# Patient Record
Sex: Female | Born: 1945
Health system: Southern US, Community
[De-identification: ages and names within clinical notes are randomized; demographics above are authoritative.]

## PROBLEM LIST (undated history)

## (undated) DIAGNOSIS — K219 Gastro-esophageal reflux disease without esophagitis: Secondary | ICD-10-CM

## (undated) DIAGNOSIS — Z86010 Personal history of colonic polyps: Secondary | ICD-10-CM

## (undated) DIAGNOSIS — E785 Hyperlipidemia, unspecified: Secondary | ICD-10-CM

## (undated) DIAGNOSIS — G5600 Carpal tunnel syndrome, unspecified upper limb: Secondary | ICD-10-CM

## (undated) DIAGNOSIS — D219 Benign neoplasm of connective and other soft tissue, unspecified: Secondary | ICD-10-CM

## (undated) DIAGNOSIS — I10 Essential (primary) hypertension: Secondary | ICD-10-CM

## (undated) DIAGNOSIS — R42 Dizziness and giddiness: Secondary | ICD-10-CM

## (undated) DIAGNOSIS — T7840XA Allergy, unspecified, initial encounter: Secondary | ICD-10-CM

## (undated) HISTORY — DX: Carpal tunnel syndrome, unspecified upper limb: G56.00

## (undated) HISTORY — PX: POLYPECTOMY: SHX149

## (undated) HISTORY — PX: COLONOSCOPY: SHX174

## (undated) HISTORY — DX: Allergy, unspecified, initial encounter: T78.40XA

## (undated) HISTORY — PX: CARPAL TUNNEL RELEASE: SHX101

## (undated) HISTORY — DX: Hyperlipidemia, unspecified: E78.5

## (undated) HISTORY — DX: Personal history of colonic polyps: Z86.010

## (undated) HISTORY — DX: Dizziness and giddiness: R42

## (undated) HISTORY — PX: BIOPSY THYROID: PRO38

## (undated) HISTORY — DX: Gastro-esophageal reflux disease without esophagitis: K21.9

## (undated) HISTORY — DX: Essential (primary) hypertension: I10

## (undated) HISTORY — DX: Benign neoplasm of connective and other soft tissue, unspecified: D21.9

## (undated) HISTORY — PX: UTERINE FIBROID SURGERY: SHX826

---

## 1991-06-17 HISTORY — PX: HYSTEROSCOPY: SHX211

## 1997-08-02 ENCOUNTER — Other Ambulatory Visit: Admission: RE | Admit: 1997-08-02 | Discharge: 1997-08-02 | Payer: Self-pay | Admitting: *Deleted

## 1999-04-24 ENCOUNTER — Encounter: Admission: RE | Admit: 1999-04-24 | Discharge: 1999-04-24 | Payer: Self-pay | Admitting: Family Medicine

## 1999-04-24 ENCOUNTER — Encounter: Payer: Self-pay | Admitting: Family Medicine

## 1999-06-25 ENCOUNTER — Ambulatory Visit (HOSPITAL_BASED_OUTPATIENT_CLINIC_OR_DEPARTMENT_OTHER): Admission: RE | Admit: 1999-06-25 | Discharge: 1999-06-25 | Payer: Self-pay | Admitting: Orthopedic Surgery

## 1999-11-05 ENCOUNTER — Encounter: Payer: Self-pay | Admitting: *Deleted

## 1999-11-05 ENCOUNTER — Ambulatory Visit (HOSPITAL_COMMUNITY): Admission: RE | Admit: 1999-11-05 | Discharge: 1999-11-05 | Payer: Self-pay | Admitting: *Deleted

## 2000-01-13 ENCOUNTER — Other Ambulatory Visit: Admission: RE | Admit: 2000-01-13 | Discharge: 2000-01-13 | Payer: Self-pay | Admitting: *Deleted

## 2000-03-31 ENCOUNTER — Encounter: Payer: Self-pay | Admitting: *Deleted

## 2000-03-31 ENCOUNTER — Encounter: Admission: RE | Admit: 2000-03-31 | Discharge: 2000-03-31 | Payer: Self-pay | Admitting: *Deleted

## 2000-04-28 DIAGNOSIS — Z860100 Personal history of colon polyps, unspecified: Secondary | ICD-10-CM

## 2000-04-28 HISTORY — DX: Personal history of colonic polyps: Z86.010

## 2000-04-28 HISTORY — DX: Personal history of colon polyps, unspecified: Z86.0100

## 2001-03-16 ENCOUNTER — Encounter: Payer: Self-pay | Admitting: *Deleted

## 2001-03-16 ENCOUNTER — Encounter: Admission: RE | Admit: 2001-03-16 | Discharge: 2001-03-16 | Payer: Self-pay | Admitting: *Deleted

## 2001-03-22 ENCOUNTER — Encounter: Payer: Self-pay | Admitting: Family Medicine

## 2001-03-22 ENCOUNTER — Encounter: Admission: RE | Admit: 2001-03-22 | Discharge: 2001-03-22 | Payer: Self-pay | Admitting: Family Medicine

## 2001-07-07 ENCOUNTER — Other Ambulatory Visit: Admission: RE | Admit: 2001-07-07 | Discharge: 2001-07-07 | Payer: Self-pay | Admitting: *Deleted

## 2002-01-18 LAB — HM COLONOSCOPY

## 2002-03-07 ENCOUNTER — Encounter: Admission: RE | Admit: 2002-03-07 | Discharge: 2002-03-07 | Payer: Self-pay | Admitting: Family Medicine

## 2002-03-07 ENCOUNTER — Encounter: Payer: Self-pay | Admitting: Family Medicine

## 2003-10-11 ENCOUNTER — Other Ambulatory Visit: Admission: RE | Admit: 2003-10-11 | Discharge: 2003-10-11 | Payer: Self-pay | Admitting: *Deleted

## 2004-06-18 ENCOUNTER — Ambulatory Visit (HOSPITAL_COMMUNITY): Admission: RE | Admit: 2004-06-18 | Discharge: 2004-06-18 | Payer: Self-pay | Admitting: Family Medicine

## 2004-06-26 ENCOUNTER — Encounter: Admission: RE | Admit: 2004-06-26 | Discharge: 2004-06-26 | Payer: Self-pay | Admitting: Family Medicine

## 2004-07-16 ENCOUNTER — Ambulatory Visit: Payer: Self-pay | Admitting: Gastroenterology

## 2004-07-22 ENCOUNTER — Ambulatory Visit (HOSPITAL_COMMUNITY): Admission: RE | Admit: 2004-07-22 | Discharge: 2004-07-22 | Payer: Self-pay | Admitting: Gastroenterology

## 2004-07-26 ENCOUNTER — Encounter: Admission: RE | Admit: 2004-07-26 | Discharge: 2004-10-24 | Payer: Self-pay | Admitting: Gastroenterology

## 2005-01-24 ENCOUNTER — Other Ambulatory Visit: Admission: RE | Admit: 2005-01-24 | Discharge: 2005-01-24 | Payer: Self-pay | Admitting: *Deleted

## 2005-11-26 ENCOUNTER — Encounter: Admission: RE | Admit: 2005-11-26 | Discharge: 2005-11-26 | Payer: Self-pay | Admitting: Family Medicine

## 2006-06-12 ENCOUNTER — Other Ambulatory Visit: Admission: RE | Admit: 2006-06-12 | Discharge: 2006-06-12 | Payer: Self-pay | Admitting: Obstetrics and Gynecology

## 2006-07-02 ENCOUNTER — Ambulatory Visit: Payer: Self-pay | Admitting: Gastroenterology

## 2006-07-27 ENCOUNTER — Ambulatory Visit: Payer: Self-pay | Admitting: Gastroenterology

## 2006-07-27 ENCOUNTER — Encounter (INDEPENDENT_AMBULATORY_CARE_PROVIDER_SITE_OTHER): Payer: Self-pay | Admitting: Specialist

## 2006-07-27 LAB — HM COLONOSCOPY

## 2007-11-17 ENCOUNTER — Other Ambulatory Visit: Admission: RE | Admit: 2007-11-17 | Discharge: 2007-11-17 | Payer: Self-pay | Admitting: Obstetrics and Gynecology

## 2008-04-28 HISTORY — PX: KNEE ARTHROSCOPY: SUR90

## 2009-08-22 ENCOUNTER — Ambulatory Visit (HOSPITAL_COMMUNITY): Admission: RE | Admit: 2009-08-22 | Discharge: 2009-08-22 | Payer: Self-pay | Admitting: Obstetrics and Gynecology

## 2009-09-12 HISTORY — PX: ENDOMETRIAL BIOPSY: SHX622

## 2009-10-22 ENCOUNTER — Ambulatory Visit (HOSPITAL_BASED_OUTPATIENT_CLINIC_OR_DEPARTMENT_OTHER): Admission: RE | Admit: 2009-10-22 | Discharge: 2009-10-22 | Payer: Self-pay | Admitting: Obstetrics and Gynecology

## 2009-10-22 HISTORY — PX: HYSTEROSCOPY: SHX211

## 2010-01-23 ENCOUNTER — Encounter (INDEPENDENT_AMBULATORY_CARE_PROVIDER_SITE_OTHER): Payer: Self-pay | Admitting: *Deleted

## 2010-01-23 LAB — CONVERTED CEMR LAB
Hgb A1c MFr Bld: 5.9 %
LDL Cholesterol: 84 mg/dL
Triglycerides: 77 mg/dL

## 2010-04-10 ENCOUNTER — Encounter (INDEPENDENT_AMBULATORY_CARE_PROVIDER_SITE_OTHER): Payer: Self-pay | Admitting: *Deleted

## 2010-04-10 LAB — CONVERTED CEMR LAB
Albumin: 4.5 g/dL
Alkaline Phosphatase: 73 units/L
Calcium: 10.1 mg/dL
Chloride, Serum: 97 mmol/L
Creatinine, Ser: 0.84 mg/dL
Potassium, serum: 4.1 mmol/L
Sodium, serum: 135 mmol/L
Total Protein: 7.2 g/dL

## 2010-05-06 ENCOUNTER — Ambulatory Visit
Admission: RE | Admit: 2010-05-06 | Discharge: 2010-05-06 | Payer: Self-pay | Source: Home / Self Care | Attending: Family Medicine | Admitting: Family Medicine

## 2010-05-06 DIAGNOSIS — E785 Hyperlipidemia, unspecified: Secondary | ICD-10-CM | POA: Insufficient documentation

## 2010-05-06 DIAGNOSIS — I10 Essential (primary) hypertension: Secondary | ICD-10-CM | POA: Insufficient documentation

## 2010-05-06 DIAGNOSIS — J309 Allergic rhinitis, unspecified: Secondary | ICD-10-CM | POA: Insufficient documentation

## 2010-05-14 ENCOUNTER — Encounter (INDEPENDENT_AMBULATORY_CARE_PROVIDER_SITE_OTHER): Payer: Self-pay | Admitting: *Deleted

## 2010-05-30 NOTE — Miscellaneous (Signed)
  Clinical Lists Changes  Observations: Added new observation of BILI TOTAL: 1.0 mg/dL (16/01/9603 54:09) Added new observation of ALK PHOS: 73 units/L (04/10/2010 14:04) Added new observation of SGPT (ALT): 33 units/L (04/10/2010 14:04) Added new observation of SGOT (AST): 35 units/L (04/10/2010 14:04) Added new observation of PROTEIN, TOT: 7.2 g/dL (81/19/1478 29:56) Added new observation of GLOBULIN TOT: 2.7 g/dL (21/30/8657 84:69) Added new observation of ALBUMIN: 4.5 g/dL (62/95/2841 32:44) Added new observation of CALCIUM: 10.1 mg/dL (04/30/7251 66:44) Added new observation of GLUCOSE SER: 103 mg/dL (03/47/4259 56:38) Added new observation of CREATININE: 0.84 mg/dL (75/64/3329 51:88) Added new observation of BUN: 19 mg/dL (41/66/0630 16:01) Added new observation of CO2 TOTAL: 26 mmol/L (04/10/2010 14:04) Added new observation of CHLORIDE: 97 mmol/L (04/10/2010 14:04) Added new observation of POTASSIUM: 4.1 mmol/L (04/10/2010 14:04) Added new observation of SODIUM: 135 mmol/L (04/10/2010 14:04) Added new observation of HGBA1C: 5.9 % (01/23/2010 14:04) Added new observation of TSH: 3.180 microintl units/mL (01/23/2010 14:04) Added new observation of TRIGLYC TOT: 77 mg/dL (09/32/3557 32:20) Added new observation of LDL: 84 mg/dL (25/42/7062 37:62) Added new observation of HDL: 52 mg/dL (83/15/1761 60:73) Added new observation of CHOLESTEROL: 151 mg/dL (71/09/2692 85:46) Added new observation of PNEUMOVAX: Historical  (03/17/2008 14:07) Added new observation of COLONOSCOPY: Adenomatous Polyp  (01/18/2002 14:20)      Immunization History:  Pneumovax Immunization History:    Pneumovax:  historical (03/17/2008)   Preventive Care Screening  Last Pneumovax:    Date:  03/17/2008    Results:  Historical   Colonoscopy:    Date:  01/18/2002    Results:  Adenomatous Polyp

## 2010-05-30 NOTE — Assessment & Plan Note (Signed)
Summary: NEW TO EST/KN   Vital Signs:  Patient profile:   65 year old female Height:      68.25 inches Weight:      226 pounds BMI:     34.24 Pulse rate:   74 / minute BP sitting:   108 / 70  (left arm)  Vitals Entered By: Doristine Devoid CMA (May 06, 2010 10:08 AM) CC: new est-    History of Present Illness: 65 yo woman here today to establish care.  previous MD- Larina Bras.  Dr Tresa Res- GYN, UTD on pap/mammo.  Dr Russella Dar does Colonoscopy- UTD, last done 2008.  HTN- on lisinopril and HCTZ.  BP is excellent.  no CP, SOB, HAs, visual changes, edema.  Hyperlipidemia- on pravastatin 40mg .  no abd pain, N/V, myalgias.  joined [employer] in October, has lost 20lbs.  cough- pt reports mucous in the back of her throat that precipitates cough.  sxs started 6 weeks ago.  otherwise feeling well.  no fevers, chills, ear pain.  some nasal congestion.  no wheezing.  cough is worse in AM and at night.  Preventive Screening-Counseling & Management  Alcohol-Tobacco     Alcohol drinks/day: <1     Smoking Status: never  Caffeine-Diet-Exercise     Does Patient Exercise: yes     Type of exercise: [employer]     Exercise (avg: min/session): 30-60     Times/week: 3      Drug Use:  never.    Current Medications (verified): 1)  Aspirin 81 Mg Tbec (Aspirin) .... Take One Tablet Daily 2)  Lisinopril 10 Mg Tabs (Lisinopril) .... Take One Tablet Daily 3)  Hydrochlorothiazide 25 Mg Tabs (Hydrochlorothiazide) .... Take One Tablet Daily 4)  Pravastatin Sodium 40 Mg Tabs (Pravastatin Sodium) .... Take One Tablet At Bedtime  Allergies (verified): 1)  ! * Iodine  Past History:  Past Medical History: Hyperlipidemia Hypertension Borderline DM  Past Surgical History: Carpal tunnel release-bilateral  fibroids-Romine  Family History: CAD-mother HTN-mother DM-mother STROKE-sister COLON CA-father? 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 No apparent alopecia or balding. Eyes:  PERRL, EOMI Ears:  External ear exam shows no significant lesions or deformities.  Otoscopic examination reveals clear canals, tympanic membranes are intact bilaterally without bulging, retraction, inflammation or discharge. Hearing is grossly normal bilaterally. Nose:  + turbinate edema Mouth:  + PND, otherwise WNL Neck:  No deformities, masses, or tenderness noted. Lungs:  Normal respiratory effort, chest expands symmetrically. Lungs are clear to auscultation, no crackles or wheezes. Heart:  Normal rate and regular rhythm. S1 and S2 normal without gallop, murmur, click, rub or other extra sounds. Pulses:  +2 carotid, radial, DP Extremities:  no C/C/E Neurologic:  alert & oriented X3, cranial nerves II-XII intact, gait normal, and DTRs symmetrical and normal.   Skin:  turgor normal and color normal.   Cervical Nodes:  No lymphadenopathy noted Psych:  Oriented X3, memory intact for recent and remote, normally interactive, good eye contact, not anxious appearing, and not depressed appearing.     Impression & Recommendations:  Problem # 1:  HYPERTENSION (ICD-401.9) Assessment New pt's BP well controlled.  asymptomatic.  continue  current meds. Her updated medication list for this problem includes:    Lisinopril 10 Mg Tabs (Lisinopril) .Marland Kitchen... Take one tablet daily    Hydrochlorothiazide 25 Mg Tabs (Hydrochlorothiazide) .Marland Kitchen... Take one tablet daily  Problem # 2:  HYPERLIPIDEMIA (ICD-272.4) Assessment: New per pt report she just had labs done.  will review records from previous MD and continue to get labs Q6 months. Her  updated medication list for this problem includes:    Pravastatin Sodium 40 Mg Tabs (Pravastatin sodium) .Marland Kitchen... Take one tablet at bedtime  Problem # 3:  RHINITIS (ICD-477.9) Assessment: New most likely the cause of pt's cough.  pt reports she has nasal spray at home- advised restart.  add OTC claritin or zyrtec.  if no improvement in cough w/ tx of allergies must consider ACE cough.  will follow.  Complete Medication List: 1)  Aspirin 81 Mg Tbec (Aspirin) .... Take one tablet daily 2)  Lisinopril 10 Mg Tabs (Lisinopril) .... Take one tablet daily 3)  Hydrochlorothiazide 25 Mg Tabs (Hydrochlorothiazide) .... Take one tablet daily 4)  Pravastatin Sodium 40 Mg Tabs (Pravastatin sodium) .... Take one tablet at bedtime  Patient Instructions: 1)  Please schedule a follow up exam in April to recheck cholesterol and BP- do not eat before this exam 2)  Continue your current medication- your blood pressure looks great! 3)  Your cough is most likely due to post nasal drip- not asthma.  restart your nasal spray (2 sprays each nostril daily) and add OTC Claritin or Zyrtec (store brand works just as well) 4)  Call with any questions or concerns 5)  Welcome!  We're glad to have you!   Orders Added: 1)  New Patient Level III [40981]

## 2010-06-28 ENCOUNTER — Encounter: Payer: Self-pay | Admitting: Family Medicine

## 2010-06-28 ENCOUNTER — Ambulatory Visit (INDEPENDENT_AMBULATORY_CARE_PROVIDER_SITE_OTHER): Payer: Medicare Other | Admitting: Family Medicine

## 2010-06-28 DIAGNOSIS — R42 Dizziness and giddiness: Secondary | ICD-10-CM | POA: Insufficient documentation

## 2010-06-28 DIAGNOSIS — I951 Orthostatic hypotension: Secondary | ICD-10-CM

## 2010-06-28 DIAGNOSIS — J309 Allergic rhinitis, unspecified: Secondary | ICD-10-CM

## 2010-07-04 NOTE — Assessment & Plan Note (Signed)
Summary: dizzy for two days, bp is good///sph   Vital Signs:  Patient profile:   66 year old female Height:      68.25 inches Weight:      227 pounds O2 Sat:      97 % on Room air Temp:     98.1 degrees F oral Pulse rate:   77 / minute BP supine:   120 / 76  (left arm) BP sitting:   118 / 70  (left arm) BP standing:   110 / 70  (left arm) Cuff size:   large  Vitals Entered By: Jeremy Johann CMA (June 28, 2010 1:17 PM)  O2 Flow:  Room air CC: positional dizziness Comments orthostatic Vital: standing pulse 84 O2 97, laying pulse 70, O2 95   History of Present Illness: 65 yo woman here today for dizziness w/ position changes.  has hx of vertigo.  had sensation of spinning when she lied down for BP check.  having dizziness w/ rapid turning of head.  no nausea, no vomiting, no visual changes (blurry/double vision).  BP excellently controlled, mildly orthostatic- did not have dizziness upon standing today.  'i think it's inner ear'.  also complaining of 'sinus thing'- not using allergy meds.  + congestion, PND, cough.  sxs started 3 days ago.  Current Medications (verified): 1)  Aspirin 81 Mg Tbec (Aspirin) .... Take One Tablet Daily 2)  Lisinopril-Hydrochlorothiazide 10-12.5 Mg Tabs (Lisinopril-Hydrochlorothiazide) .Marland Kitchen.. 1 Tab Daily 3)  Pravastatin Sodium 40 Mg Tabs (Pravastatin Sodium) .... Take One Tablet At Bedtime 4)  Fluticasone Propionate 50 Mcg/act  Susp (Fluticasone Propionate) .... 2 Sprays Each Nostril Once Daily 5)  Meclizine Hcl 25 Mg Tabs (Meclizine Hcl) .Marland Kitchen.. 1 By Mouth Q6 As Needed For Vertigo  Allergies (verified): 1)  ! * Iodine  Past History:  Past Medical History: Hyperlipidemia Hypertension Borderline DM vertigo allergic rhinitis  Review of Systems      See HPI  Physical Exam  General:  Well-developed,well-nourished,in no acute distress; alert,appropriate and cooperative throughout examination Head:  Normocephalic and atraumatic without obvious  abnormalities. No apparent alopecia or balding.  no TTP over sinuses Eyes:  PERRL, EOMI, 3 beats of horizontal nystagmus Ears:  External ear exam shows no significant lesions or deformities.  Otoscopic examination reveals clear canals, tympanic membranes are intact bilaterally without bulging, retraction, inflammation or discharge. Hearing is grossly normal bilaterally. Nose:  + turbinate edema Mouth:  + PND, otherwise WNL Neck:  No deformities, masses, or tenderness noted. Lungs:  Normal respiratory effort, chest expands symmetrically. Lungs are clear to auscultation, no crackles or wheezes. Heart:  Normal rate and regular rhythm. S1 and S2 normal without gallop, murmur, click, rub or other extra sounds. Pulses:  +2 carotid, radial, DP Extremities:  no C/C/E Neurologic:  alert & oriented X3, cranial nerves II-XII intact, strength normal in all extremities, sensation intact to light touch, gait normal, DTRs symmetrical and normal, finger-to-nose normal, and Romberg negative.     Impression & Recommendations:  Problem # 1:  VERTIGO (ICD-780.4) Assessment New pt w/ hx of vertigo- more severe than this episode.  sxs consistent w/ BPV as she has problems turning head rapidly.  start Meclizine as needed for dizziness. Her updated medication list for this problem includes:    Meclizine Hcl 25 Mg Tabs (Meclizine hcl) .Marland Kitchen... 1 by mouth q6 as needed for vertigo  Problem # 2:  ORTHOSTATIC HYPOTENSION (ICD-458.0) Assessment: New pt mildly orthostatic on PE.  decrease HCTZ to  12.5mg  daily.  recheck BP at upcoming visit.  Problem # 3:  RHINITIS (ICD-477.9) Assessment: Unchanged pt not using allergy meds as directed.  restart Flonase.  add Claritin or Zyrtec if sxs not adequately controlled on nasal steroid Her updated medication list for this problem includes:    Fluticasone Propionate 50 Mcg/act Susp (Fluticasone propionate) .Marland Kitchen... 2 sprays each nostril once daily  Complete Medication List: 1)   Aspirin 81 Mg Tbec (Aspirin) .... Take one tablet daily 2)  Lisinopril-hydrochlorothiazide 10-12.5 Mg Tabs (Lisinopril-hydrochlorothiazide) .Marland Kitchen.. 1 tab daily 3)  Pravastatin Sodium 40 Mg Tabs (Pravastatin sodium) .... Take one tablet at bedtime 4)  Fluticasone Propionate 50 Mcg/act Susp (Fluticasone propionate) .... 2 sprays each nostril once daily 5)  Meclizine Hcl 25 Mg Tabs (Meclizine hcl) .Marland Kitchen.. 1 by mouth q6 as needed for vertigo  Patient Instructions: 1)  You may have 2 things going on today- positional vertigo and low blood pressure causing your dizziness 2)  Follow up as scheduled 3)  We decreased your HCTZ to 12.5mg  and combined it w/ the Lisinopril 4)  Take the Meclizine as needed for the dizziness 5)  Drink plenty of fluids 6)  Call with any questions or concerns 7)  Hang in there! Prescriptions: MECLIZINE HCL 25 MG TABS (MECLIZINE HCL) 1 by mouth q6 as needed for vertigo  #45 x 0   Entered and Authorized by:   Neena Rhymes MD   Signed by:   Neena Rhymes MD on 06/28/2010   Method used:   Electronically to        [facility]  [address] [id number]* (retail)       [address]       [place], [place]  [address]       Ph: [phone] or [phone]       Fax: [phone]   RxID:   [id number] FLUTICASONE PROPIONATE 50 MCG/ACT  SUSP (FLUTICASONE PROPIONATE) 2 sprays each nostril once daily  #1 vial x 3   Entered and Authorized by:   Neena Rhymes MD   Signed by:   Neena Rhymes MD on 06/28/2010   Method used:   Electronically to        [facility]  [address] [id number]* (retail)       [address]       [place], [place]  [address]       Ph: [phone] or [phone]       Fax: [phone]   RxID:   [id number] LISINOPRIL-HYDROCHLOROTHIAZIDE 10-12.5 MG TABS (LISINOPRIL-HYDROCHLOROTHIAZIDE) 1 tab daily  #30 x 3   Entered and Authorized by:   Neena Rhymes MD   Signed by:   Neena Rhymes MD on 06/28/2010   Method  used:   Electronically to        [facility]  [address] [id number]* (retail)       [address]       [place], [place]  [address]       Ph: [phone] or [phone]       Fax: [phone]   RxID:   [id number]    Orders Added: 1)  Est. Patient Level IV [10175]

## 2010-07-06 ENCOUNTER — Encounter: Payer: Self-pay | Admitting: Family Medicine

## 2010-07-14 LAB — CBC
MCH: 32.6 pg (ref 26.0–34.0)
MCHC: 34.7 g/dL (ref 30.0–36.0)
MCV: 93.9 fL (ref 78.0–100.0)
Platelets: 229 10*3/uL (ref 150–400)
RBC: 4.26 MIL/uL (ref 3.87–5.11)
RDW: 14.2 % (ref 11.5–15.5)
WBC: 4.1 10*3/uL (ref 4.0–10.5)

## 2010-08-05 ENCOUNTER — Encounter: Payer: Self-pay | Admitting: Family Medicine

## 2010-08-05 ENCOUNTER — Ambulatory Visit (INDEPENDENT_AMBULATORY_CARE_PROVIDER_SITE_OTHER): Payer: Medicare Other | Admitting: Family Medicine

## 2010-08-05 DIAGNOSIS — E785 Hyperlipidemia, unspecified: Secondary | ICD-10-CM

## 2010-08-05 DIAGNOSIS — I10 Essential (primary) hypertension: Secondary | ICD-10-CM

## 2010-08-05 LAB — BASIC METABOLIC PANEL
BUN: 14 mg/dL (ref 6–23)
Calcium: 9.4 mg/dL (ref 8.4–10.5)
Creatinine, Ser: 0.8 mg/dL (ref 0.4–1.2)
GFR: 82.17 mL/min (ref >60.00)
Potassium: 4.2 mEq/L (ref 3.5–5.1)

## 2010-08-05 LAB — HEPATIC FUNCTION PANEL
ALT: 28 U/L (ref 0–35)
Albumin: 4 g/dL (ref 3.5–5.2)
Bilirubin, Direct: 0.2 mg/dL (ref 0.0–0.3)

## 2010-08-05 LAB — LIPID PANEL
Cholesterol: 132 mg/dL (ref 0–200)
LDL Cholesterol: 67 mg/dL (ref 0–99)

## 2010-08-05 MED ORDER — LISINOPRIL-HYDROCHLOROTHIAZIDE 10-12.5 MG PO TABS: 1.0000 | ORAL_TABLET | Freq: Every day | ORAL | Status: DC

## 2010-08-05 MED ORDER — PRAVASTATIN SODIUM 40 MG PO TABS: 40.0000 mg | ORAL_TABLET | Freq: Every day | ORAL | Status: DC

## 2010-08-05 NOTE — Assessment & Plan Note (Signed)
BP well controlled.  Asymptomatic.  No changes.

## 2010-08-05 NOTE — Patient Instructions (Signed)
Follow up in 6 months for your complete physical- do not eat before this appt You look great!  Keep up the good work! We'll notify you of your lab results Call with any questions or concerns Happy Spring!!!

## 2010-08-05 NOTE — Assessment & Plan Note (Signed)
Pt tolerating statin w/out difficulty.  Check labs and adjust meds prn.  Pt expressed understanding and is in agreement w/ plan.

## 2010-08-05 NOTE — Progress Notes (Signed)
  Subjective:    Patient ID: Shelby Reeves, female    DOB: June 20, 1945, 65 y.o.   MRN: [id number]  HPI  HTN- BP well controlled, hasn't taken meds this AM.  No CP, SOB, HAs, visual changes, edema.  Exercising at least 3x/week.  Hyperlipidemia- due for labs today.  No N/V, abd pain, myalgias.   Review of Systems For ROS see HPI     Objective:   Physical Exam  Constitutional: She appears well-developed and well-nourished. No distress.  HENT:  Head: Normocephalic and atraumatic.  Neck: Normal range of motion. Neck supple. No thyromegaly present.  Cardiovascular: Normal rate, regular rhythm, normal heart sounds and intact distal pulses.   No murmur heard. Pulmonary/Chest: Effort normal and breath sounds normal. No respiratory distress. She has no wheezes.  Abdominal: Soft. Bowel sounds are normal. She exhibits no distension. There is no tenderness. There is no rebound.  Skin: Skin is warm and dry.          Assessment & Plan:

## 2010-08-27 ENCOUNTER — Ambulatory Visit (INDEPENDENT_AMBULATORY_CARE_PROVIDER_SITE_OTHER): Payer: Medicare Other | Admitting: Family Medicine

## 2010-08-27 VITALS — BP 110/78 | HR 59 | Temp 97.9°F | Wt 226.0 lb

## 2010-08-27 DIAGNOSIS — M549 Dorsalgia, unspecified: Secondary | ICD-10-CM

## 2010-08-27 DIAGNOSIS — M545 Low back pain, unspecified: Secondary | ICD-10-CM

## 2010-08-27 MED ORDER — NAPROXEN 500 MG PO TABS: 500.0000 mg | ORAL_TABLET | Freq: Two times a day (BID) | ORAL | Status: DC

## 2010-08-27 NOTE — Assessment & Plan Note (Signed)
This is an acute exacerbation of a chronic problem for pt.  Has not seen ortho for this.  Given that she has frequent discomfort will refer to ortho for complete evaluation and tx.  Start scheduled NSAIDs for pain relief, heat/ice prn.  Reviewed supportive care and red flags that should prompt return.  Pt expressed understanding and is in agreement w/ plan.

## 2010-08-27 NOTE — Patient Instructions (Signed)
Take the Naprosyn for the next 7-10 days to decrease inflammation Add tylenol throughout the day as needed for pain Use heat for pain relief Try and stay active to avoid stiffness We'll call you with your Ortho appt Hang in there!!!

## 2010-08-27 NOTE — Progress Notes (Signed)
  Subjective:    Patient ID: Shelby Reeves, female    DOB: 22-Sep-1945, 65 y.o.   MRN: [id number]  HPI LBP- R sided, lumbar.  sxs started 1 week ago.  No heavy lifting, change in activity.  Was sitting in her chair and rotated, felt a 'twinge' and pain started.  Pain is not radiating.  Stays localized to R SI joint.  Has never had MRI of spine.  Initial injury occurred when pt was 15 and has had intermittant pain episodes since then.  Taking ibuprofen w/ some relief, 600mg  every 4 hrs.  Pain is worst w/ rising from a chair.   Review of Systems For ROS see HPI     Objective:   Physical Exam  Constitutional: She is oriented to person, place, and time. She appears well-developed and well-nourished. No distress.  Musculoskeletal: She exhibits no edema.       Lumbar back: She exhibits decreased range of motion and tenderness. She exhibits no bony tenderness, no swelling, no deformity and no spasm.       Back:       Pt has difficulty rising from seated position.  Gait normal.  Strength in LEs normal.  Neurological: She is alert and oriented to person, place, and time. She has normal reflexes. Coordination normal.          Assessment & Plan:

## 2011-02-04 ENCOUNTER — Encounter: Payer: Medicare Other | Admitting: Family Medicine

## 2011-02-12 ENCOUNTER — Encounter: Payer: Self-pay | Admitting: Family Medicine

## 2011-02-12 ENCOUNTER — Ambulatory Visit (INDEPENDENT_AMBULATORY_CARE_PROVIDER_SITE_OTHER): Payer: Medicare Other | Admitting: Family Medicine

## 2011-02-12 DIAGNOSIS — R059 Cough, unspecified: Secondary | ICD-10-CM

## 2011-02-12 DIAGNOSIS — Z23 Encounter for immunization: Secondary | ICD-10-CM

## 2011-02-12 DIAGNOSIS — I10 Essential (primary) hypertension: Secondary | ICD-10-CM

## 2011-02-12 DIAGNOSIS — E785 Hyperlipidemia, unspecified: Secondary | ICD-10-CM

## 2011-02-12 DIAGNOSIS — Z0000 Encounter for general adult medical examination without abnormal findings: Secondary | ICD-10-CM | POA: Insufficient documentation

## 2011-02-12 DIAGNOSIS — R05 Cough: Secondary | ICD-10-CM

## 2011-02-12 LAB — CBC WITH DIFFERENTIAL/PLATELET
Basophils Relative: 0.5 % (ref 0.0–3.0)
Eosinophils Absolute: 0.1 10*3/uL (ref 0.0–0.7)
Eosinophils Relative: 1.7 % (ref 0.0–5.0)
HCT: 41.5 % (ref 36.0–46.0)
Hemoglobin: 13.9 g/dL (ref 12.0–15.0)
Lymphocytes Relative: 28.8 % (ref 12.0–46.0)
Lymphs Abs: 1.3 10*3/uL (ref 0.7–4.0)
Monocytes Absolute: 0.4 10*3/uL (ref 0.1–1.0)
Monocytes Relative: 8.9 % (ref 3.0–12.0)
Neutro Abs: 2.8 10*3/uL (ref 1.4–7.7)
Neutrophils Relative %: 60.1 % (ref 43.0–77.0)
Platelets: 263 10*3/uL (ref 150.0–400.0)

## 2011-02-12 LAB — BASIC METABOLIC PANEL
BUN: 19 mg/dL (ref 6–23)
CO2: 28 mEq/L (ref 19–32)
Calcium: 9.8 mg/dL (ref 8.4–10.5)
GFR: 83.32 mL/min (ref >60.00)
Glucose, Bld: 94 mg/dL (ref 70–99)

## 2011-02-12 LAB — HEPATIC FUNCTION PANEL
ALT: 37 U/L — ABNORMAL HIGH (ref 0–35)
Albumin: 4.1 g/dL (ref 3.5–5.2)
Alkaline Phosphatase: 71 U/L (ref 39–117)
Bilirubin, Direct: 0 mg/dL (ref 0.0–0.3)

## 2011-02-12 LAB — LIPID PANEL
LDL Cholesterol: 86 mg/dL (ref 0–99)
Total CHOL/HDL Ratio: 3
Triglycerides: 98 mg/dL (ref 0.0–149.0)

## 2011-02-12 LAB — TSH: TSH: 3.25 u[IU]/mL (ref 0.35–5.50)

## 2011-02-12 NOTE — Patient Instructions (Signed)
Follow up in 6 months to recheck BP and cholesterol Keep up the good work on diet and exercise Schedule an appt w/ your dermatologist for a once over [facility] notify you of your lab results Call with any questions or concerns Happy Holidays!!

## 2011-02-12 NOTE — Progress Notes (Signed)
  Subjective:    Patient ID: Shelby Reeves, female    DOB: 02-14-1946, 65 y.o.   MRN: [id number]  HPI Here today for CPE.  Risk Factors: HTN- chronic problem, well controlled, on Lisinopril HCT.  Asymptomatic. Hyperlipidemia- chronic problem, typically well controlled on Pravastatin.  No N/V, abd pain, myalgias Physical Activity: walking regularly Fall Risk: low risk Depression: denies current sxs Hearing: normal to conversational tones and whispered voice at 6 ft ADL's: indpendent Cognitive: normal linear thought process, memory and attention intact Home Safety: safe at home, lives alone Height, Weight, BMI, Visual Acuity: see vitals, vision corrected to 20/20 w/ glasses Counseling: GYN- Romine, UTD pap and mammo.  UTD on colonoscopy Russella Dar) Labs Ordered: See A&P Care Plan: See A&P    Review of Systems Patient reports no vision/ hearing changes, adenopathy,fever, weight change,  persistant/recurrent hoarseness , swallowing issues, chest pain, palpitations, edema, hemoptysis, dyspnea (rest/exertional/paroxysmal nocturnal), gastrointestinal bleeding (melena, rectal bleeding), abdominal pain, significant heartburn, bowel changes, GU symptoms (dysuria, hematuria, incontinence), Gyn symptoms (abnormal  bleeding, pain),  syncope, focal weakness, memory loss, numbness & tingling, skin/hair/nail changes, abnormal bruising or bleeding, anxiety, or depression.   Persistent cough- occurs when lying down, w/ prolonged talking    Objective:   Physical Exam  General Appearance:    Alert, cooperative, no distress, appears stated age  Head:    Normocephalic, without obvious abnormality, atraumatic  Eyes:    PERRL, conjunctiva/corneas clear, EOM's intact, fundi    benign, both eyes  Ears:    Normal TM's and external ear canals, both ears  Nose:   Nares normal, septum midline, mucosa normal, no drainage    or sinus tenderness  Throat:   Lips, mucosa, and tongue normal; teeth and gums normal    Neck:   Supple, symmetrical, trachea midline, no adenopathy;    Thyroid: no enlargement/tenderness/nodules  Back:     Symmetric, no curvature, ROM normal, no CVA tenderness  Lungs:     Clear to auscultation bilaterally, respirations unlabored  Chest Wall:    No tenderness or deformity   Heart:    Regular rate and rhythm, S1 and S2 normal, no murmur, rub   or gallop  Breast Exam:    Deferred to GYN  Abdomen:     Soft, non-tender, bowel sounds active all four quadrants,    no masses, no organomegaly  Genitalia:    Deferred to GYN  Rectal:    Extremities:   Extremities normal, atraumatic, no cyanosis or edema  Pulses:   2+ and symmetric all extremities  Skin:   Skin color, texture, turgor normal, no rashes or lesions  Lymph nodes:   Cervical, supraclavicular, and axillary nodes normal  Neurologic:   CNII-XII intact, normal strength, sensation and reflexes    throughout          Assessment & Plan:

## 2011-02-17 ENCOUNTER — Encounter: Payer: Self-pay | Admitting: *Deleted

## 2011-02-25 DIAGNOSIS — R05 Cough: Secondary | ICD-10-CM | POA: Insufficient documentation

## 2011-02-25 DIAGNOSIS — R059 Cough, unspecified: Secondary | ICD-10-CM | POA: Insufficient documentation

## 2011-02-25 NOTE — Assessment & Plan Note (Signed)
Pt's cough is most likely due to PND or silent GERD.  Recommended she restart her nasal spray.  Discussed OTC PPI trial.  If sxs persist, she will need CXR.  Pt expressed understanding and is in agreement w/ plan.

## 2011-02-25 NOTE — Assessment & Plan Note (Signed)
Chronic problem.  Tolerating statin w/out difficulty.  Check labs.  Adjust meds prn  

## 2011-02-25 NOTE — Assessment & Plan Note (Signed)
Pt's PE WNL.  UTD on health maintenance.  Check labs.  Anticipatory guidance provided.  

## 2011-02-25 NOTE — Assessment & Plan Note (Signed)
Chronic problem.  Well controlled.  Asymptomatic.  No changes. 

## 2011-03-06 ENCOUNTER — Ambulatory Visit (INDEPENDENT_AMBULATORY_CARE_PROVIDER_SITE_OTHER): Payer: Medicare Other | Admitting: Family Medicine

## 2011-03-06 ENCOUNTER — Encounter: Payer: Self-pay | Admitting: Family Medicine

## 2011-03-06 VITALS — BP 110/75 | HR 90 | Temp 98.5°F | Ht 68.0 in | Wt 245.4 lb

## 2011-03-06 DIAGNOSIS — R05 Cough: Secondary | ICD-10-CM

## 2011-03-06 DIAGNOSIS — R059 Cough, unspecified: Secondary | ICD-10-CM

## 2011-03-06 MED ORDER — GUAIFENESIN-CODEINE 100-10 MG/5ML PO SYRP: 5.0000 mL | ORAL_SOLUTION | Freq: Two times a day (BID) | ORAL | Status: AC | PRN

## 2011-03-06 MED ORDER — BENZONATATE 200 MG PO CAPS: 200.0000 mg | ORAL_CAPSULE | Freq: Three times a day (TID) | ORAL | Status: AC | PRN

## 2011-03-06 MED ORDER — AZITHROMYCIN 250 MG PO TABS: 250.0000 mg | ORAL_TABLET | Freq: Every day | ORAL | Status: AC

## 2011-03-06 NOTE — Assessment & Plan Note (Signed)
Given duration of illness will start abx.  Reviewed supportive care and red flags that should prompt return.  Pt expressed understanding and is in agreement w/ plan.  

## 2011-03-06 NOTE — Progress Notes (Signed)
  Subjective:    Patient ID: Shelby Reeves, female    DOB: 06-04-1945, 65 y.o.   MRN: [id number]  HPI Cough and chest congestion- sxs started 3 weeks ago.  Initially thought it was a cold but sxs have persisted.  Now waking pt at night.  Wet cough but rarely productive.  Denies sinus pain/pressure.  No fevers.  + sick contacts.  Review of Systems For ROS see HPI     Objective:   Physical Exam  Vitals reviewed. Constitutional: She appears well-developed and well-nourished. No distress.  HENT:  Head: Normocephalic and atraumatic.       TMs normal bilaterally Mild nasal congestion Throat w/out erythema, edema, or exudate  Eyes: Conjunctivae and EOM are normal. Pupils are equal, round, and reactive to light.  Neck: Normal range of motion. Neck supple.  Cardiovascular: Normal rate, regular rhythm, normal heart sounds and intact distal pulses.   No murmur heard. Pulmonary/Chest: Effort normal and breath sounds normal. No respiratory distress. She has no wheezes.       + hacking cough  Lymphadenopathy:    She has no cervical adenopathy.          Assessment & Plan:

## 2011-03-06 NOTE — Patient Instructions (Signed)
This is bronchitis Take the Azithromycin as directed Use the cough meds as needed- syrup at night (will make you drowsy) Drink plenty of fluids Take mucinex to thin your congestion Hang in there!!!

## 2011-03-13 ENCOUNTER — Telehealth: Payer: Self-pay | Admitting: Family Medicine

## 2011-03-13 NOTE — Telephone Encounter (Signed)
Pt states she completed z pack and states symptoms are not improved. Does not feel any worse. Has productive cough with clear phlegm. Still taking Mucinex. Pt states she is concerned about possible pulmonary edema as her cough has been occuring x 1 year. Scheduled pt appt with Dr Beverely Low tomorrow at 1:45pm to discuss.

## 2011-03-13 NOTE — Telephone Encounter (Signed)
Patient was seen week ago - she completed z- pac - still has cough - - she still has cough med but said she isnt taking it because it didn't help her sleep

## 2011-03-14 ENCOUNTER — Encounter: Payer: Self-pay | Admitting: Family Medicine

## 2011-03-14 ENCOUNTER — Ambulatory Visit (INDEPENDENT_AMBULATORY_CARE_PROVIDER_SITE_OTHER): Payer: Medicare Other | Admitting: Family Medicine

## 2011-03-14 ENCOUNTER — Ambulatory Visit (INDEPENDENT_AMBULATORY_CARE_PROVIDER_SITE_OTHER)
Admission: RE | Admit: 2011-03-14 | Discharge: 2011-03-14 | Disposition: A | Discharge status: Home / Self Care | Payer: Medicare Other | Source: Ambulatory Visit | Attending: Family Medicine | Admitting: Family Medicine

## 2011-03-14 VITALS — BP 120/60 | HR 86 | Temp 97.6°F | Ht 67.0 in | Wt 246.8 lb

## 2011-03-14 DIAGNOSIS — R059 Cough, unspecified: Secondary | ICD-10-CM

## 2011-03-14 DIAGNOSIS — R05 Cough: Secondary | ICD-10-CM

## 2011-03-14 MED ORDER — CLARITHROMYCIN ER 500 MG PO TB24: 1000.0000 mg | ORAL_TABLET | Freq: Every day | ORAL | Status: AC

## 2011-03-14 NOTE — Assessment & Plan Note (Signed)
Pt's cough is deep and barking.  Persistent despite abx.  At this stage will get CXR to assess.  Switch to longer course of Biaxin.  Continue mucinex and tessalon.  Reviewed supportive care and red flags that should prompt return.  Pt expressed understanding and is in agreement w/ plan.

## 2011-03-14 NOTE — Progress Notes (Signed)
  Subjective:    Patient ID: Shelby Reeves, female    DOB: [birth year], 65 y.o.   MRN: [id number]  HPI Cough- started Zpack on 11/8, still having coughing 'fits'.  No fevers.  Cough is productive of white sputum.  Developed sinus pressure.  No ear pain.  + nasal congestion.  Was taking Mucinex which improved cough.   Review of Systems For ROS see HPI     Objective:   Physical Exam  Constitutional: She appears well-developed and well-nourished. No distress.  HENT:  Head: Normocephalic and atraumatic.  Nose: No mucosal edema or rhinorrhea. Right sinus exhibits no maxillary sinus tenderness and no frontal sinus tenderness. Left sinus exhibits no maxillary sinus tenderness and no frontal sinus tenderness.  Mouth/Throat: Uvula is midline and mucous membranes are normal.       TMs normal bilaterally Mild nasal congestion Throat w/out erythema, edema, or exudate  Eyes: Conjunctivae and EOM are normal. Pupils are equal, round, and reactive to light.  Neck: Normal range of motion. Neck supple.  Cardiovascular: Normal rate, regular rhythm, normal heart sounds and intact distal pulses.   No murmur heard. Pulmonary/Chest: Effort normal and breath sounds normal. No respiratory distress. She has no wheezes.       + hacking cough  Lymphadenopathy:    She has no cervical adenopathy.          Assessment & Plan:

## 2011-03-14 NOTE — Patient Instructions (Signed)
Go to [address] and get your chest xray- we'll call you with the results Start the Biaxin- 2 tabs at the same time- w/ food Use the tessalon for cough Continue the Mucinex REST! Lots of fluids Hang in there!

## 2011-06-24 ENCOUNTER — Encounter: Payer: Self-pay | Admitting: Gastroenterology

## 2011-07-21 ENCOUNTER — Encounter: Payer: Self-pay | Admitting: Family Medicine

## 2011-07-21 ENCOUNTER — Ambulatory Visit (INDEPENDENT_AMBULATORY_CARE_PROVIDER_SITE_OTHER): Payer: Medicare Other | Admitting: Family Medicine

## 2011-07-21 VITALS — BP 121/75 | HR 75 | Temp 98.3°F | Ht 67.5 in | Wt 252.2 lb

## 2011-07-21 DIAGNOSIS — R059 Cough, unspecified: Secondary | ICD-10-CM

## 2011-07-21 DIAGNOSIS — R05 Cough: Secondary | ICD-10-CM

## 2011-07-21 MED ORDER — LOSARTAN POTASSIUM-HCTZ 50-12.5 MG PO TABS: 1.0000 | ORAL_TABLET | Freq: Every day | ORAL | Status: DC

## 2011-07-21 MED ORDER — ALBUTEROL SULFATE (5 MG/ML) 0.5% IN NEBU
2.5000 mg | INHALATION_SOLUTION | Freq: Once | RESPIRATORY_TRACT | Status: AC
  Administered 2011-07-21: 2.5 mg via RESPIRATORY_TRACT

## 2011-07-21 NOTE — Patient Instructions (Signed)
Follow up in 1 month to recheck BP STOP the Lisinopril START the Losartan HCTZ daily We'll call you with your pulmonary appt Add Mucinex to thin your congestion Call with any questions or concerns Hang in there!!

## 2011-07-21 NOTE — Progress Notes (Signed)
  Subjective:    Patient ID: Shelby Reeves, female    DOB: 20-Jun-1945, 66 y.o.   MRN: [id number]  HPI Chronic cough- pt reports ~2 yrs of sxs.  Pt has been using Flonase w/out relief.  Cough continues to be productive of clear sputum but sputum is thick.  'it will actually choke me'.  Pt reports she otherwise feels well but continues to cough.  Had clear CXR in November.  Denies GERD sxs.   Review of Systems For ROS see HPI     Objective:   Physical Exam  Constitutional: She appears well-developed and well-nourished. No distress.  HENT:  Head: Normocephalic and atraumatic.       TMs normal bilaterally Mild nasal congestion Throat w/out erythema, edema, or exudate  Eyes: Conjunctivae and EOM are normal. Pupils are equal, round, and reactive to light.  Neck: Normal range of motion. Neck supple.  Cardiovascular: Normal rate, regular rhythm, normal heart sounds and intact distal pulses.   No murmur heard. Pulmonary/Chest: Effort normal and breath sounds normal. No respiratory distress. She has no wheezes.       + wet, hacking cough  Lymphadenopathy:    She has no cervical adenopathy.          Assessment & Plan:

## 2011-07-22 NOTE — Assessment & Plan Note (Signed)
Chronic problem.  Will switch from ACE to ARB to r/o possible cause.  Pt denies GERD.  Did not really respond to neb in office.  Using nasal steroid for PND.  Had clear CXR recently.  Refer to pulm for evaluation and tx.  Pt expressed understanding and is in agreement w/ plan.

## 2011-08-06 ENCOUNTER — Institutional Professional Consult (permissible substitution): Payer: Medicare Other | Admitting: Internal Medicine

## 2011-08-15 ENCOUNTER — Other Ambulatory Visit: Payer: Self-pay | Admitting: Family Medicine

## 2011-08-15 DIAGNOSIS — E785 Hyperlipidemia, unspecified: Secondary | ICD-10-CM

## 2011-08-15 MED ORDER — PRAVASTATIN SODIUM 40 MG PO TABS: 40.0000 mg | ORAL_TABLET | Freq: Every day | ORAL | Status: DC

## 2011-08-15 NOTE — Telephone Encounter (Signed)
Rx sent, Pt aware. 

## 2011-08-15 NOTE — Telephone Encounter (Signed)
Message from Triage line: Patient states she called her Pharmacy on Tuesday or Wednesday to get a refill on her Pravastatin and the pharmacy told her today they have not heard back from [place]. She is out of her medication and would like it sent in today if possible Patient phone# [phone]

## 2011-08-19 ENCOUNTER — Ambulatory Visit (INDEPENDENT_AMBULATORY_CARE_PROVIDER_SITE_OTHER): Payer: Medicare Other | Admitting: Internal Medicine

## 2011-08-19 ENCOUNTER — Encounter: Payer: Self-pay | Admitting: Internal Medicine

## 2011-08-19 VITALS — BP 130/82 | HR 73 | Temp 98.3°F | Ht 67.0 in | Wt 258.2 lb

## 2011-08-19 DIAGNOSIS — R059 Cough, unspecified: Secondary | ICD-10-CM

## 2011-08-19 DIAGNOSIS — R05 Cough: Secondary | ICD-10-CM

## 2011-08-19 DIAGNOSIS — I10 Essential (primary) hypertension: Secondary | ICD-10-CM

## 2011-08-19 NOTE — Progress Notes (Signed)
  Subjective:    Patient ID: Shelby Reeves, female    DOB: [birth year]  MRN: [id number]  HPI  42 yowf never smoker referred 08/19/2011 by Dr Beverely Low for eval of chronic cough.  08/19/2011 1st pulmonary eval prev eval by Dr Jethro Bolus while still in this office Pos allergy to dust mold selfish for hives w/u in 1990's and since then has noticed increased symptoms of seasonal cough while on acei much worse x 1.5 years but ACEI stopped 3 week prior to OV  And 75% improved and still sense of something stuck in back of throat sometimes cough so bad gag urinary incont but not vomiting.  Also indolent onset progessive sob with exertion like steps or walking fast  x 1.5 years not directly related to cough. Better p lie down, not worse p eating, but talking or laughing made it worse.  Sleeping ok without nocturnal  or early am exacerbation  of respiratory  c/o's or need for noct saba. Also denies any obvious fluctuation of symptoms with weather or environmental changes or other aggravating or alleviating factors except as outlined above    Review of Systems  Constitutional: Negative for fever and unexpected weight change.  HENT: Negative for ear pain, nosebleeds, congestion, sore throat, rhinorrhea, sneezing, trouble swallowing, dental problem, postnasal drip and sinus pressure.   Eyes: Negative for redness and itching.  Respiratory: Positive for cough and shortness of breath. Negative for chest tightness and wheezing.   Cardiovascular: Positive for leg swelling. Negative for chest pain and palpitations.  Gastrointestinal: Negative for nausea and vomiting.  Genitourinary: Negative for dysuria.  Musculoskeletal: Negative for joint swelling.  Skin: Negative for rash.  Neurological: Negative for headaches.  Hematological: Does not bruise/bleed easily.  Psychiatric/Behavioral: Negative for dysphoric mood. The patient is not nervous/anxious.        Objective:   Physical Exam  amb obese wf with classic  voice fatigue  Wt 258 08/19/2011   HEENT: nl dentition, turbinates, and orophanx. Nl external ear canals without cough reflex   NECK :  without JVD/Nodes/TM/ nl carotid upstrokes bilaterally   LUNGS: no acc muscle use, clear to A and P bilaterally without cough on insp or exp maneuvers   CV:  RRR  no s3 or murmur or increase in P2, no edema   ABD:  soft and nontender with nl excursion in the supine position. No bruits or organomegaly, bowel sounds nl  MS:  warm without deformities, calf tenderness, cyanosis or clubbing  SKIN: warm and dry without lesions    NEURO:  alert, approp, no deficits    CXR 03/14/11 No acute disease in the chest.      Assessment & Plan:

## 2011-08-19 NOTE — Patient Instructions (Signed)
Try prilosec 20mg   Take 30-60 min before first meal of the day and Pepcid 20 mg one bedtime  X 3 more weeks then ok to stop  GERD (REFLUX)  is an extremely common cause of respiratory symptoms, many times with no significant heartburn at all.    It can be treated with medication, but also with lifestyle changes including avoidance of late meals, excessive alcohol, smoking cessation, and avoid fatty foods, chocolate, peppermint, colas, red wine, and acidic juices such as orange juice.  NO MINT OR MENTHOL PRODUCTS SO NO COUGH DROPS  USE SUGARLESS CANDY INSTEAD (jolley ranchers or Stover's)  NO OIL BASED VITAMINS - use powdered substitutes.  Return in 3 weeks for re-evaluation if not 100% better.

## 2011-08-20 NOTE — Assessment & Plan Note (Addendum)
This is most likely  Classic Upper airway cough syndrome, so named because it's frequently impossible to sort out how much is  CR/sinusitis with freq throat clearing (which can be related to primary GERD)   vs  causing  secondary (" extra esophageal")  GERD from wide swings in gastric pressure that occur with throat clearing, often  promoting self use of mint and menthol lozenges that reduce the lower esophageal sphincter tone and exacerbate the problem further in a cyclical fashion.   These are the same pts (now being labeled as having "irritable larynx syndrome" by some cough centers) who not infrequently have a history of having failed to tolerate ace inhibitors,  dry powder inhalers or biphosphonates or report having atypical reflux symptoms that don't respond to standard doses of PPI , and are easily confused as having aecopd or asthma flares by even experienced allergists/ pulmonologists.  Note ACEI's are just one of the problems, occult GERD being a frequent co-conspirator as Chronic cough is often simultaneously caused by more than one condition. A single cause has been found from 38 to 82% of the time, multiple causes from 18 to 62%. Multiply caused cough has been the result of three diseases up to 42% of the time.   For now just needs to extinguish the lingering cyclical cough with secondary gerd likely  then regroup here if not satisfied  See instructions for specific recommendations which were reviewed directly with the patient who was given a copy with highlighter outlining the key components.

## 2011-08-20 NOTE — Assessment & Plan Note (Signed)
ACE inhibitors are problematic in  pts with airway complaints because  even experienced pulmonologists can't always distinguish ace effects from copd/asthma/pnds/ allergies etc.  By themselves they don't actually cause a problem, much like oxygen can't by itself start a fire, but they certainly serve as a powerful catalyst or enhancer for any "fire"  or inflammatory process in the upper airway, be it caused by an ET  tube or more commonly reflux (especially in the obese or pts with known GERD or who are on biphoshonates) or URI's, due to interference with bradykinin clearance.  The effects of acei on bradykinin levels occurs in 100% of pt's on acei (unless they surreptitiously stop the med!) but the classic cough is only reported in 5%.  This leaves 95% of pts on acei's  with a variety of syndromes including no identifiable symptom in most  vs non-specific symptoms that wax and wane depending on what other insult is occuring at the level of the upper airway (especially low grade GERD or pnds, nonspecific or allergic in nature)  Agree fully with Dr Beverely Low leave off ACEI indefinitely

## 2011-08-21 ENCOUNTER — Encounter: Payer: Self-pay | Admitting: Family Medicine

## 2011-08-21 ENCOUNTER — Ambulatory Visit (INDEPENDENT_AMBULATORY_CARE_PROVIDER_SITE_OTHER): Payer: Medicare Other | Admitting: Family Medicine

## 2011-08-21 VITALS — BP 128/79 | HR 86 | Temp 98.4°F | Ht 67.25 in | Wt 255.8 lb

## 2011-08-21 DIAGNOSIS — R059 Cough, unspecified: Secondary | ICD-10-CM

## 2011-08-21 DIAGNOSIS — R05 Cough: Secondary | ICD-10-CM

## 2011-08-21 DIAGNOSIS — I10 Essential (primary) hypertension: Secondary | ICD-10-CM

## 2011-08-21 LAB — BASIC METABOLIC PANEL
BUN: 15 mg/dL (ref 6–23)
CO2: 27 mEq/L (ref 19–32)
Chloride: 102 mEq/L (ref 96–112)
Potassium: 3.5 mEq/L (ref 3.5–5.1)

## 2011-08-21 MED ORDER — HYDROCHLOROTHIAZIDE 12.5 MG PO TABS: 12.5000 mg | ORAL_TABLET | Freq: Every day | ORAL | Status: DC

## 2011-08-21 NOTE — Patient Instructions (Signed)
Your blood pressure looks great! Add the HCTZ 12.5mg  as needed for swelling We'll notify you of your lab results Call with any questions or concerns Hang in there!!

## 2011-08-21 NOTE — Assessment & Plan Note (Signed)
Chronic problem.  Good control since switching to Hyzaar.  Pt notes LE edema w/ warmer weather.  Add 12.5mg  HCTZ prn.  Reviewed supportive care and red flags that should prompt return.  Pt expressed understanding and is in agreement w/ plan.

## 2011-08-21 NOTE — Progress Notes (Signed)
  Subjective:    Patient ID: Shelby Reeves, female    DOB: 08/03/1945, 66 y.o.   MRN: [id number]  HPI Cough- saw Dr Sherene Sires 2 days ago, reports sxs are >75% improved since stopping ACE.  Was started on reflux med by [facility] for remaining cough.  HTN- BP well controlled since switching to Hyzaar.  Reports she is having some ankle edema since the weather has warmed.  No CP, SOB, HAs, visual changes.   Review of Systems For ROS see HPI     Objective:   Physical Exam  Vitals reviewed. Constitutional: She is oriented to person, place, and time. She appears well-developed and well-nourished. No distress.  HENT:  Head: Normocephalic and atraumatic.  Eyes: Conjunctivae and EOM are normal. Pupils are equal, round, and reactive to light.  Neck: Normal range of motion. Neck supple. No thyromegaly present.  Cardiovascular: Normal rate, regular rhythm, normal heart sounds and intact distal pulses.   No murmur heard. Pulmonary/Chest: Effort normal and breath sounds normal. No respiratory distress.  Abdominal: Soft. She exhibits no distension. There is no tenderness.  Musculoskeletal: She exhibits edema (trace ankle edema bilaterally).  Lymphadenopathy:    She has no cervical adenopathy.  Neurological: She is alert and oriented to person, place, and time.  Skin: Skin is warm and dry.  Psychiatric: She has a normal mood and affect. Her behavior is normal.          Assessment & Plan:

## 2011-08-21 NOTE — Assessment & Plan Note (Signed)
Much improved since stopping ACE.  Pt to f/u w/ Dr Sherene Sires if residual cough doesn't improve w/ tx of GERD.

## 2011-08-27 ENCOUNTER — Encounter: Payer: Self-pay | Admitting: *Deleted

## 2012-03-05 ENCOUNTER — Encounter: Payer: Self-pay | Admitting: Family Medicine

## 2012-03-05 ENCOUNTER — Ambulatory Visit (INDEPENDENT_AMBULATORY_CARE_PROVIDER_SITE_OTHER): Payer: Medicare Other | Admitting: Family Medicine

## 2012-03-05 VITALS — BP 118/78 | HR 77 | Temp 98.2°F | Resp 15 | Wt 232.5 lb

## 2012-03-05 DIAGNOSIS — R8290 Unspecified abnormal findings in urine: Secondary | ICD-10-CM

## 2012-03-05 DIAGNOSIS — R82998 Other abnormal findings in urine: Secondary | ICD-10-CM

## 2012-03-05 LAB — POCT URINALYSIS DIPSTICK
Bilirubin, UA: NEGATIVE
Ketones, UA: NEGATIVE
Leukocytes, UA: NEGATIVE
Spec Grav, UA: <=1.005

## 2012-03-05 MED ORDER — NITROFURANTOIN MONOHYD MACRO 100 MG PO CAPS: 100.0000 mg | ORAL_CAPSULE | Freq: Two times a day (BID) | ORAL | Status: DC

## 2012-03-05 NOTE — Patient Instructions (Addendum)
Start the Macrobid twice daily for possible infection Drink plenty of fluids Call with any questions or concerns We'll be in touch w/ your culture results!

## 2012-03-05 NOTE — Assessment & Plan Note (Signed)
Pt's sxs consistent w/ UTI but UA WNL.  Will start empiric abx since it is the weekend and D/C if cx is negative.  No vaginal bulge noted- pt reassured.  Will follow.

## 2012-03-05 NOTE — Progress Notes (Signed)
  Subjective:    Patient ID: Shelby Reeves, female    DOB: 12/09/1945, 66 y.o.   MRN: [id number]  HPI ? UTI- has had intermittent sxs 'for a couple of weeks or so'.  Used OTC meds.  Woke up this AM w/ urgency, dysuria.  Some increased frequency.  Has increased fluid intake.  sxs seem to improve as day goes on.  Denies suprapubic pressure or CVA tenderness.  No fevers.  Hx of similar.  Urine has strong smell.  Vaginal bulge- L sided, saw GYN 2 weeks ago w/ normal exam.  Not painful.  Has never had similar.   Review of Systems For ROS see HPI     Objective:   Physical Exam  Vitals reviewed. Constitutional: She appears well-developed and well-nourished. No distress.  Abdominal: Soft. She exhibits no distension. There is no tenderness (no suprapubic or CVA tenderness).  Genitourinary: Vagina normal. No vaginal discharge found.       No bulge noted or palpable          Assessment & Plan:

## 2012-03-08 ENCOUNTER — Telehealth: Payer: Self-pay | Admitting: *Deleted

## 2012-03-08 LAB — URINE CULTURE

## 2012-03-08 NOTE — Telephone Encounter (Signed)
Pt c/o increase heart burn since changing her diet and losing weight. Pt notes that in the past she has taking protonix and it help. Pt would like to get Rx for med . Please Advise.

## 2012-03-09 MED ORDER — PANTOPRAZOLE SODIUM 40 MG PO TBEC: 40.0000 mg | DELAYED_RELEASE_TABLET | Freq: Every day | ORAL | Status: DC

## 2012-03-09 NOTE — Telephone Encounter (Signed)
Rx sent, Pt aware. 

## 2012-03-09 NOTE — Telephone Encounter (Signed)
Ok for [employer] 40mg  daily, #30, 3 refills

## 2012-03-16 ENCOUNTER — Encounter: Payer: Self-pay | Admitting: Gastroenterology

## 2012-03-16 ENCOUNTER — Ambulatory Visit (AMBULATORY_SURGERY_CENTER): Payer: Medicare Other | Admitting: *Deleted

## 2012-03-16 VITALS — Ht 67.0 in | Wt 230.4 lb

## 2012-03-16 DIAGNOSIS — Z1211 Encounter for screening for malignant neoplasm of colon: Secondary | ICD-10-CM

## 2012-03-16 MED ORDER — MOVIPREP 100 G PO SOLR: ORAL | Status: DC

## 2012-03-30 ENCOUNTER — Encounter: Payer: Self-pay | Admitting: Gastroenterology

## 2012-03-30 ENCOUNTER — Ambulatory Visit (AMBULATORY_SURGERY_CENTER): Payer: Medicare Other | Admitting: Gastroenterology

## 2012-03-30 VITALS — BP 116/51 | HR 58 | Temp 97.0°F | Resp 21 | Ht 67.0 in | Wt 230.0 lb

## 2012-03-30 DIAGNOSIS — Z860100 Personal history of colon polyps, unspecified: Secondary | ICD-10-CM

## 2012-03-30 DIAGNOSIS — Z1211 Encounter for screening for malignant neoplasm of colon: Secondary | ICD-10-CM

## 2012-03-30 MED ORDER — SODIUM CHLORIDE 0.9 % IV SOLN: 500.0000 mL | INTRAVENOUS | Status: DC

## 2012-03-30 NOTE — Patient Instructions (Addendum)

## 2012-03-30 NOTE — Op Note (Signed)
[facility] [address] [place], [address]   COLONOSCOPY PROCEDURE REPORT  PATIENT: Shelby, Reeves  MR#: [id number] BIRTHDATE: 21-Oct-1945 , 66  yrs. old GENDER: Female ENDOSCOPIST: Meryl Dare, MD, [facility] PROCEDURE DATE:  03/30/2012 PROCEDURE:   Colonoscopy, diagnostic ASA CLASS:   Class II INDICATIONS:Patient's personal history of colon polyps. MEDICATIONS: MAC sedation, administered by CRNA and propofol (Diprivan) 200mg  IV DESCRIPTION OF PROCEDURE:   After the risks benefits and alternatives of the procedure were thoroughly explained, informed consent was obtained.  A digital rectal exam revealed no abnormalities of the rectum.   The LB CF-[id number]  endoscope was introduced through the anus and advanced to the cecum, which was identified by both the appendix and ileocecal valve. No adverse events experienced.   The quality of the prep was adequate, using MoviPrep  The instrument was then slowly withdrawn as the colon was fully examined.  COLON FINDINGS: Moderate diverticulosis was noted in the sigmoid colon and descending colon.   The colon was otherwise normal. There was no diverticulosis, inflammation, polyps or cancers unless previously stated.  Retroflexed views revealed internal hemorrhoids. The time to cecum=5 minutes 54 seconds.  Withdrawal time=9 minutes 09 seconds.  The scope was withdrawn and the procedure completed.  COMPLICATIONS: There were no complications.  ENDOSCOPIC IMPRESSION: 1.   Moderate diverticulosis was noted in the sigmoid colon and descending colon 2.   Small internal hemorrhoids  RECOMMENDATIONS: 1.  High fiber diet with liberal fluid intake. 2.  Repeat Colonoscopy in 5 years.  eSigned:  Meryl Dare, MD, [facility] 03/30/2012 10:18 AM

## 2012-03-31 ENCOUNTER — Telehealth: Payer: Self-pay | Admitting: *Deleted

## 2012-03-31 NOTE — Telephone Encounter (Signed)
  Follow up Call-  Call back number 03/30/2012  Post procedure Call Back phone  # [phone]  Permission to leave phone message Yes     Patient questions:  Do you have a fever, pain , or abdominal swelling? no Pain Score  0 *  Have you tolerated food without any problems? yes  Have you been able to return to your normal activities? yes  Do you have any questions about your discharge instructions: Diet   no Medications  no Follow up visit  no  Do you have questions or concerns about your Care? no  Actions: * If pain score is 4 or above: No action needed, pain <4.

## 2012-04-29 ENCOUNTER — Ambulatory Visit (INDEPENDENT_AMBULATORY_CARE_PROVIDER_SITE_OTHER): Payer: Medicare Other

## 2012-04-29 DIAGNOSIS — Z23 Encounter for immunization: Secondary | ICD-10-CM

## 2012-07-12 ENCOUNTER — Telehealth: Payer: Self-pay | Admitting: Family Medicine

## 2012-07-12 DIAGNOSIS — E785 Hyperlipidemia, unspecified: Secondary | ICD-10-CM

## 2012-07-12 NOTE — Telephone Encounter (Signed)
Refills x 2  1-Losartan HCTZ 100-25 MG #45 wt/3-refills Take 1/2 tablet by mouth once daily, last fill 12.16.13  2-Pravastatin Sodium 40 MG Tab #90 wt/3-refills Take 1 tablet by mouth once daily last fill 1.10.14

## 2012-07-14 MED ORDER — PRAVASTATIN SODIUM 40 MG PO TABS: ORAL_TABLET | ORAL | Status: DC

## 2012-07-14 MED ORDER — LOSARTAN POTASSIUM-HCTZ 50-12.5 MG PO TABS: 1.0000 | ORAL_TABLET | Freq: Every day | ORAL | Status: DC

## 2012-08-06 ENCOUNTER — Telehealth: Payer: Self-pay

## 2012-08-06 NOTE — Telephone Encounter (Signed)
error 

## 2012-08-20 ENCOUNTER — Other Ambulatory Visit: Payer: Self-pay | Admitting: *Deleted

## 2012-08-20 NOTE — Telephone Encounter (Signed)
Opened in error

## 2012-09-06 ENCOUNTER — Other Ambulatory Visit: Payer: Self-pay | Admitting: Obstetrics and Gynecology

## 2012-09-06 DIAGNOSIS — Z1231 Encounter for screening mammogram for malignant neoplasm of breast: Secondary | ICD-10-CM

## 2012-09-06 LAB — HM MAMMOGRAPHY

## 2012-09-13 ENCOUNTER — Ambulatory Visit (HOSPITAL_COMMUNITY)
Admission: RE | Admit: 2012-09-13 | Discharge: 2012-09-13 | Disposition: A | Discharge status: Home / Self Care | Payer: Medicare Other | Source: Ambulatory Visit | Attending: Obstetrics and Gynecology | Admitting: Obstetrics and Gynecology

## 2012-09-13 DIAGNOSIS — Z1231 Encounter for screening mammogram for malignant neoplasm of breast: Secondary | ICD-10-CM

## 2012-09-23 ENCOUNTER — Encounter: Payer: Self-pay | Admitting: Family Medicine

## 2012-09-23 ENCOUNTER — Ambulatory Visit (INDEPENDENT_AMBULATORY_CARE_PROVIDER_SITE_OTHER): Payer: Medicare Other | Admitting: Family Medicine

## 2012-09-23 VITALS — BP 140/80 | HR 82 | Temp 98.3°F | Ht 68.0 in | Wt 251.6 lb

## 2012-09-23 DIAGNOSIS — Z0000 Encounter for general adult medical examination without abnormal findings: Secondary | ICD-10-CM

## 2012-09-23 DIAGNOSIS — I10 Essential (primary) hypertension: Secondary | ICD-10-CM

## 2012-09-23 DIAGNOSIS — E785 Hyperlipidemia, unspecified: Secondary | ICD-10-CM

## 2012-09-23 DIAGNOSIS — F411 Generalized anxiety disorder: Secondary | ICD-10-CM

## 2012-09-23 DIAGNOSIS — R209 Unspecified disturbances of skin sensation: Secondary | ICD-10-CM

## 2012-09-23 DIAGNOSIS — Z1331 Encounter for screening for depression: Secondary | ICD-10-CM

## 2012-09-23 DIAGNOSIS — R202 Paresthesia of skin: Secondary | ICD-10-CM

## 2012-09-23 LAB — HEPATIC FUNCTION PANEL
ALT: 27 U/L (ref 0–35)
AST: 24 U/L (ref 0–37)
Albumin: 4 g/dL (ref 3.5–5.2)
Alkaline Phosphatase: 71 U/L (ref 39–117)
Bilirubin, Direct: 0.1 mg/dL (ref 0.0–0.3)
Total Bilirubin: 1.2 mg/dL (ref 0.3–1.2)
Total Protein: 7.4 g/dL (ref 6.0–8.3)

## 2012-09-23 LAB — CBC WITH DIFFERENTIAL/PLATELET
Basophils Absolute: 0 K/uL (ref 0.0–0.1)
Basophils Relative: 0.5 % (ref 0.0–3.0)
Eosinophils Absolute: 0 K/uL (ref 0.0–0.7)
Eosinophils Relative: 0.6 % (ref 0.0–5.0)
HCT: 40.5 % (ref 36.0–46.0)
Hemoglobin: 14.2 g/dL (ref 12.0–15.0)
Lymphocytes Relative: 20 % (ref 12.0–46.0)
Lymphs Abs: 0.9 K/uL (ref 0.7–4.0)
MCHC: 35.2 g/dL (ref 30.0–36.0)
MCV: 93 fl (ref 78.0–100.0)
Monocytes Absolute: 0.3 K/uL (ref 0.1–1.0)
Monocytes Relative: 6.2 % (ref 3.0–12.0)
Neutro Abs: 3.4 K/uL (ref 1.4–7.7)
Neutrophils Relative %: 72.7 % (ref 43.0–77.0)
Platelets: 261 K/uL (ref 150.0–400.0)
RBC: 4.35 Mil/uL (ref 3.87–5.11)
RDW: 13.6 % (ref 11.5–14.6)
WBC: 4.7 K/uL (ref 4.5–10.5)

## 2012-09-23 LAB — LIPID PANEL
Cholesterol: 142 mg/dL (ref 0–200)
HDL: 57.6 mg/dL
LDL Cholesterol: 69 mg/dL (ref 0–99)
Total CHOL/HDL Ratio: 2
Triglycerides: 75 mg/dL (ref 0.0–149.0)
VLDL: 15 mg/dL (ref 0.0–40.0)

## 2012-09-23 LAB — BASIC METABOLIC PANEL
BUN: 16 mg/dL (ref 6–23)
CO2: 25 mEq/L (ref 19–32)
Chloride: 103 mEq/L (ref 96–112)
GFR: 73.65 mL/min (ref >60.00)
Glucose, Bld: 110 mg/dL — ABNORMAL HIGH (ref 70–99)
Potassium: 3.4 mEq/L — ABNORMAL LOW (ref 3.5–5.1)
Sodium: 137 mEq/L (ref 135–145)

## 2012-09-23 MED ORDER — FLUOXETINE HCL 20 MG PO TABS: 20.0000 mg | ORAL_TABLET | Freq: Every day | ORAL | Status: DC

## 2012-09-23 NOTE — Progress Notes (Signed)
  Subjective:    Patient ID: Shelby Reeves, female    DOB: November 11, 1945, 67 y.o.   MRN: [id number]  HPI Here today for CPE.  Risk Factors: HTN- chronic problem, mildly elevated today.  On Losartan HCTZ.  Denies CP, SOB, HAs, visual changes, edema. Hyperlipidemia- chronic problem, on Pravastatin.  Denies abd pain, N/V, myalgias. L arm numbness- pt reports her L 3rd and 4th fingers will intermittently go numb when arm is resting on table, chair, etc.  Physical Activity: not exercising regularly Fall Risk: low risk Depression: denies current sxs but struggles w/ anxiety.  Feels this is why she is eating and gaining weight. Hearing: normal to conversational tones and whispered voice at 6 ft ADL's: independent Cognitive: normal linear thought process, memory and attention intact Home Safety: usually feels safe at home but does get worried living alone Height, Weight, BMI, Visual Acuity: see vitals, vision corrected to 20/20 w/ glasses Counseling: UTD on colonoscopy, mammo ([facility])- has never had DEXA. Labs Ordered: See A&P Care Plan: See A&P    Review of Systems Patient reports no vision/ hearing changes, adenopathy,fever, weight change,  persistant/recurrent hoarseness , swallowing issues, chest pain, palpitations, edema, persistant/recurrent cough, hemoptysis, dyspnea (rest/exertional/paroxysmal nocturnal), gastrointestinal bleeding (melena, rectal bleeding), abdominal pain, significant heartburn, bowel changes, GU symptoms (dysuria, hematuria, incontinence), Gyn symptoms (abnormal  bleeding, pain),  syncope, focal weakness, memory loss, skin/hair/nail changes, abnormal bruising or bleeding, anxiety, or depression.     Objective:   Physical Exam General Appearance:    Alert, cooperative, no distress, appears stated age  Head:    Normocephalic, without obvious abnormality, atraumatic  Eyes:    PERRL, conjunctiva/corneas clear, EOM's intact, fundi    benign, both eyes  Ears:    Normal TM's  and external ear canals, both ears  Nose:   Nares normal, septum midline, mucosa normal, no drainage    or sinus tenderness  Throat:   Lips, mucosa, and tongue normal; teeth and gums normal  Neck:   Supple, symmetrical, trachea midline, no adenopathy;    Thyroid: no enlargement/tenderness/nodules  Back:     Symmetric, no curvature, ROM normal, no CVA tenderness  Lungs:     Clear to auscultation bilaterally, respirations unlabored  Chest Wall:    No tenderness or deformity   Heart:    Regular rate and rhythm, S1 and S2 normal, no murmur, rub   or gallop  Breast Exam:    Deferred to GYN  Abdomen:     Soft, non-tender, bowel sounds active all four quadrants,    no masses, no organomegaly  Genitalia:    Deferred to GYN  Rectal:    Extremities:   Extremities normal, atraumatic, no cyanosis or edema  Pulses:   2+ and symmetric all extremities  Skin:   Skin color, texture, turgor normal, no rashes or lesions  Lymph nodes:   Cervical, supraclavicular, and axillary nodes normal  Neurologic:   CNII-XII intact, normal strength, sensation and reflexes    throughout          Assessment & Plan:

## 2012-09-23 NOTE — Patient Instructions (Addendum)
Follow up in 3 months to recheck anxiety and weight loss Start the Prozac daily We'll notify you of your lab results Discuss getting a bone density w/ your GYN Try and get regular exercise!  This will help w/ both anxiety and weight loss Try not to rest your elbow- this will cause numbness.  Call if this worsens Call with any questions or concerns Hang in there!

## 2012-09-24 ENCOUNTER — Ambulatory Visit: Payer: Medicare Other

## 2012-09-24 DIAGNOSIS — R7309 Other abnormal glucose: Secondary | ICD-10-CM

## 2012-09-24 LAB — HEMOGLOBIN A1C: Hgb A1c MFr Bld: 5.9 % (ref 4.6–6.5)

## 2012-09-25 NOTE — Assessment & Plan Note (Signed)
Pt's PE WNL.  UTD on health maintenance w/ exception of DEXA- encouraged her to discuss this w/ GYN so that it could be done at same time as mammo.  Check labs.  Anticipatory guidance provided.

## 2012-09-25 NOTE — Assessment & Plan Note (Signed)
Chronic problem.  Adequate but not ideal control.  Asymptomatic.  Check labs.  No anticipated med changes. 

## 2012-09-25 NOTE — Assessment & Plan Note (Signed)
New.  Suspect ulnar nerve irritation/entrapment based on pt's sxs.  Encouraged her to be more mindful of arm positioning.  If sxs worsen, pt will need ortho evaluation.  Pt expressed understanding and is in agreement w/ plan.

## 2012-09-25 NOTE — Assessment & Plan Note (Signed)
New.  Pt admits to increased anxiety and self medicating w/ food- leading to weight gain which further increases her anxiety.  Start low dose SSRI to control anxiety and follow closely.

## 2012-09-25 NOTE — Assessment & Plan Note (Signed)
Chronic problem.  Tolerating med w/out difficulty.  Check labs.  Adjust meds prn

## 2012-09-27 NOTE — Progress Notes (Signed)
LMOVM for pt to return call in regards to labs.

## 2012-10-18 ENCOUNTER — Other Ambulatory Visit: Payer: Self-pay | Admitting: Family Medicine

## 2012-10-19 ENCOUNTER — Other Ambulatory Visit: Payer: Self-pay | Admitting: *Deleted

## 2012-10-19 DIAGNOSIS — I10 Essential (primary) hypertension: Secondary | ICD-10-CM

## 2012-10-19 MED ORDER — HYDROCHLOROTHIAZIDE 12.5 MG PO TABS: 12.5000 mg | ORAL_TABLET | Freq: Every day | ORAL | Status: DC

## 2012-10-19 NOTE — Telephone Encounter (Signed)
Refill for hydrodiuril sent to [facility]

## 2012-11-16 ENCOUNTER — Other Ambulatory Visit: Payer: Self-pay | Admitting: Family Medicine

## 2012-11-17 ENCOUNTER — Other Ambulatory Visit: Payer: Self-pay | Admitting: *Deleted

## 2012-11-17 MED ORDER — PRAVASTATIN SODIUM 40 MG PO TABS: 40.0000 mg | ORAL_TABLET | Freq: Every day | ORAL | Status: DC

## 2013-01-17 ENCOUNTER — Other Ambulatory Visit: Payer: Self-pay | Admitting: Family Medicine

## 2013-01-18 NOTE — Telephone Encounter (Signed)
Rx filled for 1 month, Pt needs to schedule an OV. SW, CMA

## 2013-01-26 ENCOUNTER — Ambulatory Visit (INDEPENDENT_AMBULATORY_CARE_PROVIDER_SITE_OTHER): Payer: Medicare Other

## 2013-01-26 DIAGNOSIS — Z23 Encounter for immunization: Secondary | ICD-10-CM

## 2013-02-07 ENCOUNTER — Ambulatory Visit: Payer: Self-pay | Admitting: Obstetrics and Gynecology

## 2013-02-14 ENCOUNTER — Other Ambulatory Visit: Payer: Self-pay | Admitting: Family Medicine

## 2013-02-14 NOTE — Telephone Encounter (Signed)
Med filled and letter mailed to pt to make follow up appt for hypertension.

## 2013-02-25 ENCOUNTER — Ambulatory Visit (INDEPENDENT_AMBULATORY_CARE_PROVIDER_SITE_OTHER): Payer: Medicare Other | Admitting: Family Medicine

## 2013-02-25 ENCOUNTER — Encounter: Payer: Self-pay | Admitting: Family Medicine

## 2013-02-25 VITALS — BP 124/78 | HR 73 | Temp 98.3°F | Resp 16 | Wt 258.1 lb

## 2013-02-25 DIAGNOSIS — I10 Essential (primary) hypertension: Secondary | ICD-10-CM

## 2013-02-25 DIAGNOSIS — K439 Ventral hernia without obstruction or gangrene: Secondary | ICD-10-CM

## 2013-02-25 DIAGNOSIS — E785 Hyperlipidemia, unspecified: Secondary | ICD-10-CM

## 2013-02-25 LAB — LIPID PANEL
Cholesterol: 130 mg/dL (ref 0–200)
Triglycerides: 96 mg/dL (ref 0.0–149.0)

## 2013-02-25 LAB — BASIC METABOLIC PANEL
BUN: 15 mg/dL (ref 6–23)
CO2: 27 mEq/L (ref 19–32)
Calcium: 9.8 mg/dL (ref 8.4–10.5)
Chloride: 102 mEq/L (ref 96–112)
Creatinine, Ser: 0.7 mg/dL (ref 0.4–1.2)

## 2013-02-25 LAB — HEPATIC FUNCTION PANEL
ALT: 30 U/L (ref 0–35)
Total Protein: 7.1 g/dL (ref 6.0–8.3)

## 2013-02-25 NOTE — Assessment & Plan Note (Signed)
Chronic problem.  Tolerating statin w/out difficulty.  Check labs.  Adjust meds prn  

## 2013-02-25 NOTE — Patient Instructions (Signed)
Schedule your complete physical for late May/June We'll notify you of your lab results and make any changes if needed Keep up the good work! If your possible hernia pain changes or worsens- call! Happy Holidays!

## 2013-02-25 NOTE — Assessment & Plan Note (Signed)
Chronic problem.  Well controlled today.  Asymptomatic.  Check labs.  No anticipated med changes. 

## 2013-02-25 NOTE — Assessment & Plan Note (Signed)
New.  Pt's sxs consistent w/ possible hernia but no palpable defect today on PE.  More likely to be a muscle weakness.  Offered pt surgical consultation- she declined.  Will follow.

## 2013-02-25 NOTE — Progress Notes (Signed)
  Subjective:    Patient ID: Shelby Reeves, female    DOB: 11-Jul-1945, 67 y.o.   MRN: [id number]  HPI HTN- chronic problem, on Hyzaar.  Denies CP, SOB, HAs, visual changes, edema.  Hyperlipidemia- chronic problem, on pravastatin.  Denies abd pain, N/V, myalgias.  Ventral hernia- pt reports for 'years' she has had a slight discomfort in L flank.  Worse w/ standing for prolonged periods.  Pain improves w/ lying down.  Area of discomfort is 'dime sized'.   Review of Systems For ROS see HPI     Objective:   Physical Exam  Vitals reviewed. Constitutional: She appears well-developed and well-nourished. No distress.  HENT:  Head: Normocephalic and atraumatic.  Right Ear: Tympanic membrane normal.  Left Ear: Tympanic membrane normal.  Nose: Mucosal edema and rhinorrhea present. Right sinus exhibits maxillary sinus tenderness and frontal sinus tenderness. Left sinus exhibits maxillary sinus tenderness and frontal sinus tenderness.  Mouth/Throat: Uvula is midline and mucous membranes are normal. Posterior oropharyngeal erythema present. No oropharyngeal exudate.  Eyes: Conjunctivae and EOM are normal. Pupils are equal, round, and reactive to light.  Neck: Normal range of motion. Neck supple.  Cardiovascular: Normal rate, regular rhythm and normal heart sounds.   Pulmonary/Chest: Effort normal and breath sounds normal. No respiratory distress. She has no wheezes.  Abdominal: Soft. Bowel sounds are normal. She exhibits no distension and no mass. There is no tenderness. There is no rebound.  No palpable abd wall defect over area in question on L abdominal wall  Lymphadenopathy:    She has no cervical adenopathy.          Assessment & Plan:

## 2013-02-28 ENCOUNTER — Encounter: Payer: Self-pay | Admitting: General Practice

## 2013-02-28 MED ORDER — POTASSIUM CHLORIDE CRYS ER 20 MEQ PO TBCR: 20.0000 meq | EXTENDED_RELEASE_TABLET | Freq: Every day | ORAL | Status: DC

## 2013-03-28 ENCOUNTER — Other Ambulatory Visit: Payer: Self-pay | Admitting: Family Medicine

## 2013-03-28 NOTE — Telephone Encounter (Signed)
Med filled.  

## 2013-05-27 ENCOUNTER — Other Ambulatory Visit: Payer: Self-pay | Admitting: Family Medicine

## 2013-05-27 NOTE — Telephone Encounter (Signed)
Med filled.  

## 2013-05-31 ENCOUNTER — Other Ambulatory Visit: Payer: Self-pay | Admitting: General Practice

## 2013-05-31 MED ORDER — LOSARTAN POTASSIUM-HCTZ 50-12.5 MG PO TABS: ORAL_TABLET | ORAL | Status: DC

## 2013-11-11 ENCOUNTER — Other Ambulatory Visit: Payer: Self-pay | Admitting: Family Medicine

## 2013-11-11 NOTE — Telephone Encounter (Signed)
Med filled and letter mailed to pt to schedule an appt.  

## 2013-12-07 ENCOUNTER — Telehealth: Payer: Self-pay | Admitting: Family Medicine

## 2013-12-07 ENCOUNTER — Encounter: Payer: Self-pay | Admitting: Family Medicine

## 2013-12-07 ENCOUNTER — Ambulatory Visit (INDEPENDENT_AMBULATORY_CARE_PROVIDER_SITE_OTHER): Payer: Medicare Other | Admitting: Family Medicine

## 2013-12-07 VITALS — BP 126/82 | HR 64 | Temp 98.0°F | Resp 16 | Wt 264.5 lb

## 2013-12-07 DIAGNOSIS — E785 Hyperlipidemia, unspecified: Secondary | ICD-10-CM

## 2013-12-07 DIAGNOSIS — I10 Essential (primary) hypertension: Secondary | ICD-10-CM

## 2013-12-07 MED ORDER — PHENTERMINE HCL 37.5 MG PO CAPS: 37.5000 mg | ORAL_CAPSULE | ORAL | Status: DC

## 2013-12-07 MED ORDER — PRAVASTATIN SODIUM 40 MG PO TABS: ORAL_TABLET | ORAL | Status: DC

## 2013-12-07 MED ORDER — LOSARTAN POTASSIUM-HCTZ 50-12.5 MG PO TABS: ORAL_TABLET | ORAL | Status: DC

## 2013-12-07 MED ORDER — PANTOPRAZOLE SODIUM 40 MG PO TBEC: 40.0000 mg | DELAYED_RELEASE_TABLET | Freq: Every day | ORAL | Status: DC

## 2013-12-07 MED ORDER — FLUOXETINE HCL 20 MG PO TABS: 20.0000 mg | ORAL_TABLET | Freq: Every day | ORAL | Status: DC

## 2013-12-07 MED ORDER — POTASSIUM CHLORIDE CRYS ER 20 MEQ PO TBCR: 20.0000 meq | EXTENDED_RELEASE_TABLET | Freq: Every day | ORAL | Status: DC | PRN

## 2013-12-07 MED ORDER — HYDROCHLOROTHIAZIDE 12.5 MG PO TABS: ORAL_TABLET | ORAL | Status: DC

## 2013-12-07 NOTE — Progress Notes (Signed)
   Subjective:    Patient ID: Shelby Reeves, female    DOB: Aug 10, 1945, 68 y.o.   MRN: [id number]  HPI HTN- chronic problem, on Losartan HCTZ.  Denies CP, SOB, HAs, visual changes.    Hyperlipidemia- chronic problem, on Pravastatin.  Denies abd pain, N/V, myalgias.  Obesity- pt is frustrated by weight gain (6 lbs since Oct).  Not doing formal exercise but has active job in retail.  Pt not following any particular diet, 'i think a pill is the only thing that's going to help me'.  No hx of seizures.    Review of Systems For ROS see HPI     Objective:   Physical Exam  Vitals reviewed. Constitutional: She is oriented to person, place, and time. She appears well-developed and well-nourished. No distress.  obese  HENT:  Head: Normocephalic and atraumatic.  Eyes: Conjunctivae and EOM are normal. Pupils are equal, round, and reactive to light.  Neck: Normal range of motion. Neck supple. No thyromegaly present.  Cardiovascular: Normal rate, regular rhythm, normal heart sounds and intact distal pulses.   No murmur heard. Pulmonary/Chest: Effort normal and breath sounds normal. No respiratory distress.  Abdominal: Soft. She exhibits no distension. There is no tenderness.  Musculoskeletal: She exhibits no edema.  Lymphadenopathy:    She has no cervical adenopathy.  Neurological: She is alert and oriented to person, place, and time.  Skin: Skin is warm and dry.  Psychiatric: She has a normal mood and affect. Her behavior is normal.          Assessment & Plan:

## 2013-12-07 NOTE — Progress Notes (Signed)
Pre visit review using our clinic review tool, if applicable. No additional management support is needed unless otherwise documented below in the visit note. 

## 2013-12-07 NOTE — Assessment & Plan Note (Signed)
Ongoing struggle for pt.  Stressed need for healthy diet and regular exercise in addition to medication or it will not yield the results that she desires.  Will start phentermine daily.  Discussed possible side effects- tachycardia, insomnia, anxiety, HTN.  Will follow closely.

## 2013-12-07 NOTE — Assessment & Plan Note (Signed)
Chronic problem, well controlled.  Asymptomatic.  Check labs.  No anticipated med changes. °

## 2013-12-07 NOTE — Telephone Encounter (Signed)
Relevant patient education assigned to patient using Emmi. ° °

## 2013-12-07 NOTE — Patient Instructions (Signed)
Follow up in 4 weeks to recheck BP, HR, and weight loss We'll notify you of your lab results and make any changes if needed Start the Phentermine daily in the AM Make sure you are following a low carb, healthy diet (1800-2000 calories) Goal is exercise for at least 30 minutes, 3-4x/week (walking or any sort of activity counts!) Call with any questions or concerns Enjoy the rest of your summer!!

## 2013-12-07 NOTE — Assessment & Plan Note (Signed)
Chronic problem.  Tolerating statin w/o difficulty.  Check labs.  Adjust meds prn  

## 2013-12-08 ENCOUNTER — Encounter: Payer: Self-pay | Admitting: General Practice

## 2013-12-08 ENCOUNTER — Other Ambulatory Visit: Payer: Medicare Other

## 2013-12-08 LAB — HEPATIC FUNCTION PANEL
ALBUMIN: 3.7 g/dL (ref 3.5–5.2)
ALK PHOS: 64 U/L (ref 39–117)
ALT: 43 U/L — AB (ref 0–35)
AST: 35 U/L (ref 0–37)
Bilirubin, Direct: 0.1 mg/dL (ref 0.0–0.3)
Total Bilirubin: 1.2 mg/dL (ref 0.2–1.2)
Total Protein: 6.6 g/dL (ref 6.0–8.3)

## 2013-12-08 LAB — BASIC METABOLIC PANEL
BUN: 19 mg/dL (ref 6–23)
CHLORIDE: 106 meq/L (ref 96–112)
CO2: 26 meq/L (ref 19–32)
Calcium: 9.4 mg/dL (ref 8.4–10.5)
Creatinine, Ser: 0.8 mg/dL (ref 0.4–1.2)
GFR: 81.35 mL/min (ref >60.00)
GLUCOSE: 99 mg/dL (ref 70–99)
POTASSIUM: 3.6 meq/L (ref 3.5–5.1)
SODIUM: 139 meq/L (ref 135–145)

## 2013-12-08 LAB — LIPID PANEL
CHOLESTEROL: 127 mg/dL (ref 0–200)
HDL: 44.6 mg/dL (ref >39.00)
LDL Cholesterol: 70 mg/dL (ref 0–99)
NonHDL: 82.4
TRIGLYCERIDES: 61 mg/dL (ref 0.0–149.0)
Total CHOL/HDL Ratio: 3
VLDL: 12.2 mg/dL (ref 0.0–40.0)

## 2013-12-08 LAB — TSH: TSH: 2.38 u[IU]/mL (ref 0.35–4.50)

## 2014-01-04 ENCOUNTER — Ambulatory Visit: Payer: Medicare Other | Admitting: Family Medicine

## 2014-01-05 ENCOUNTER — Encounter: Payer: Self-pay | Admitting: Family Medicine

## 2014-01-05 ENCOUNTER — Ambulatory Visit (INDEPENDENT_AMBULATORY_CARE_PROVIDER_SITE_OTHER): Payer: Medicare Other | Admitting: Family Medicine

## 2014-01-05 DIAGNOSIS — Z23 Encounter for immunization: Secondary | ICD-10-CM

## 2014-01-05 NOTE — Progress Notes (Signed)
   Subjective:    Patient ID: Magdalen Spatz, female    DOB: 15-Jun-1945, 68 y.o.   MRN: [id number]  HPI Obesity- pt started Phentermine at last visit.  Has lost 13 lbs in 1 month.  Pt reports feeling better.  Reports appetite is suppressed.  No elevation in BP, no palpitations.  No insomnia above baseline.  No HAs.   Review of Systems For ROS see HPI     Objective:   Physical Exam  Vitals reviewed. Constitutional: She is oriented to person, place, and time. She appears well-developed and well-nourished. No distress.  HENT:  Head: Normocephalic and atraumatic.  Eyes: Conjunctivae and EOM are normal. Pupils are equal, round, and reactive to light.  Neck: Normal range of motion. Neck supple. No thyromegaly present.  Cardiovascular: Normal rate, regular rhythm, normal heart sounds and intact distal pulses.   No murmur heard. Pulmonary/Chest: Effort normal and breath sounds normal. No respiratory distress.  Musculoskeletal: She exhibits no edema.  Lymphadenopathy:    She has no cervical adenopathy.  Neurological: She is alert and oriented to person, place, and time.  Skin: Skin is warm and dry.  Psychiatric: She has a normal mood and affect. Her behavior is normal.          Assessment & Plan:

## 2014-01-05 NOTE — Assessment & Plan Note (Signed)
Pt has lost 13 lbs since starting phentermine.  No side effects from meds.  Continue for total of 3 months.  Again encouraged healthy diet and regular exercise.  Will follow.

## 2014-01-05 NOTE — Progress Notes (Signed)
Pre visit review using our clinic review tool, if applicable. No additional management support is needed unless otherwise documented below in the visit note. 

## 2014-01-05 NOTE — Patient Instructions (Signed)
Schedule your complete physical for May Continue the Phentermine as directed Keep up the good work on healthy diet and regular exercise Call with any questions or concerns Happy Fall!

## 2014-02-07 ENCOUNTER — Ambulatory Visit (INDEPENDENT_AMBULATORY_CARE_PROVIDER_SITE_OTHER): Payer: Medicare Other

## 2014-02-07 DIAGNOSIS — Z23 Encounter for immunization: Secondary | ICD-10-CM

## 2014-02-27 ENCOUNTER — Encounter: Payer: Self-pay | Admitting: Family Medicine

## 2014-03-16 ENCOUNTER — Encounter: Payer: Medicare Other | Admitting: Family Medicine

## 2014-03-21 ENCOUNTER — Other Ambulatory Visit: Payer: Self-pay | Admitting: Family Medicine

## 2014-03-21 NOTE — Telephone Encounter (Signed)
Med filled.  

## 2014-07-16 ENCOUNTER — Other Ambulatory Visit: Payer: Self-pay | Admitting: Family Medicine

## 2014-07-17 NOTE — Telephone Encounter (Signed)
Med filled.  

## 2014-09-01 ENCOUNTER — Telehealth: Payer: Self-pay | Admitting: Family Medicine

## 2014-09-01 NOTE — Telephone Encounter (Signed)
Pre Visit letter sent  °

## 2014-09-22 ENCOUNTER — Encounter: Payer: Medicare Other | Admitting: Family Medicine

## 2014-10-10 ENCOUNTER — Other Ambulatory Visit: Payer: Self-pay | Admitting: Family Medicine

## 2014-10-10 NOTE — Telephone Encounter (Signed)
Med filled.  

## 2014-11-21 ENCOUNTER — Other Ambulatory Visit: Payer: Self-pay | Admitting: Family Medicine

## 2015-02-09 ENCOUNTER — Encounter (INDEPENDENT_AMBULATORY_CARE_PROVIDER_SITE_OTHER): Payer: Self-pay

## 2015-02-09 ENCOUNTER — Ambulatory Visit (INDEPENDENT_AMBULATORY_CARE_PROVIDER_SITE_OTHER): Payer: Medicare Other | Admitting: Family Medicine

## 2015-02-09 ENCOUNTER — Encounter: Payer: Self-pay | Admitting: Family Medicine

## 2015-02-09 VITALS — BP 122/80 | HR 76 | Temp 98.1°F | Resp 16 | Ht 68.0 in | Wt 266.1 lb

## 2015-02-09 DIAGNOSIS — Z1231 Encounter for screening mammogram for malignant neoplasm of breast: Secondary | ICD-10-CM | POA: Diagnosis not present

## 2015-02-09 DIAGNOSIS — I10 Essential (primary) hypertension: Secondary | ICD-10-CM | POA: Diagnosis not present

## 2015-02-09 DIAGNOSIS — E785 Hyperlipidemia, unspecified: Secondary | ICD-10-CM | POA: Diagnosis not present

## 2015-02-09 DIAGNOSIS — Z0000 Encounter for general adult medical examination without abnormal findings: Secondary | ICD-10-CM | POA: Diagnosis not present

## 2015-02-09 DIAGNOSIS — Z780 Asymptomatic menopausal state: Secondary | ICD-10-CM

## 2015-02-09 LAB — HEPATIC FUNCTION PANEL
ALT: 29 U/L (ref 6–29)
AST: 26 U/L (ref 10–35)
Albumin: 3.9 g/dL (ref 3.6–5.1)
Alkaline Phosphatase: 64 U/L (ref 33–130)
Bilirubin, Direct: 0.3 mg/dL — ABNORMAL HIGH (ref <=0.2)
Indirect Bilirubin: 1 mg/dL (ref 0.2–1.2)
TOTAL PROTEIN: 6.4 g/dL (ref 6.1–8.1)
Total Bilirubin: 1.3 mg/dL — ABNORMAL HIGH (ref 0.2–1.2)

## 2015-02-09 LAB — CBC WITH DIFFERENTIAL/PLATELET
BASOS PCT: 1 % (ref 0–1)
Basophils Absolute: 0 10*3/uL (ref 0.0–0.1)
EOS ABS: 0.1 10*3/uL (ref 0.0–0.7)
EOS PCT: 3 % (ref 0–5)
HEMATOCRIT: 38.8 % (ref 36.0–46.0)
Hemoglobin: 13.5 g/dL (ref 12.0–15.0)
Lymphocytes Relative: 37 % (ref 12–46)
Lymphs Abs: 1.4 10*3/uL (ref 0.7–4.0)
MCH: 32.6 pg (ref 26.0–34.0)
MCHC: 34.8 g/dL (ref 30.0–36.0)
MCV: 93.7 fL (ref 78.0–100.0)
MONO ABS: 0.4 10*3/uL (ref 0.1–1.0)
MPV: 9.2 fL (ref 8.6–12.4)
Monocytes Relative: 10 % (ref 3–12)
Neutro Abs: 1.9 10*3/uL (ref 1.7–7.7)
Neutrophils Relative %: 49 % (ref 43–77)
Platelets: 242 10*3/uL (ref 150–400)
RBC: 4.14 MIL/uL (ref 3.87–5.11)
RDW: 14.2 % (ref 11.5–15.5)
WBC: 3.8 10*3/uL — AB (ref 4.0–10.5)

## 2015-02-09 LAB — LIPID PANEL
CHOLESTEROL: 119 mg/dL — AB (ref 125–200)
HDL: 52 mg/dL (ref >=46)
LDL Cholesterol: 54 mg/dL (ref <130)
TRIGLYCERIDES: 67 mg/dL (ref <150)
Total CHOL/HDL Ratio: 2.3 Ratio (ref <=5.0)
VLDL: 13 mg/dL (ref <30)

## 2015-02-09 LAB — BASIC METABOLIC PANEL
BUN: 14 mg/dL (ref 7–25)
CHLORIDE: 103 mmol/L (ref 98–110)
CO2: 27 mmol/L (ref 20–31)
CREATININE: 0.73 mg/dL (ref 0.60–0.93)
Calcium: 9.5 mg/dL (ref 8.6–10.4)
Glucose, Bld: 90 mg/dL (ref 65–99)
Potassium: 3.6 mmol/L (ref 3.5–5.3)
Sodium: 140 mmol/L (ref 135–146)

## 2015-02-09 LAB — HEMOGLOBIN A1C
HEMOGLOBIN A1C: 5.9 % — AB (ref <5.7)
Mean Plasma Glucose: 123 mg/dL — ABNORMAL HIGH (ref <117)

## 2015-02-09 LAB — TSH: TSH: 2.366 u[IU]/mL (ref 0.350–4.500)

## 2015-02-09 MED ORDER — PHENTERMINE HCL 37.5 MG PO CAPS: 37.5000 mg | ORAL_CAPSULE | ORAL | Status: DC

## 2015-02-09 NOTE — Patient Instructions (Signed)
Follow up the week of Thanksgiving to recheck weight loss progress We'll notify you of your lab results and make any changes if needed Try and eat low carb- avoiding sugary or starchy foods- and the goal is to get 30 minutes of activity 3-5x/week You are up to date on colonoscopy until 2018- yay! I put in the order for bone density and mammo to be done downstairs Restart the phentermine once daily Call with any questions or concerns If you want to join [place] at the new [place] office, any scheduled appointments will automatically transfer and we will see you at [address] [place] [address] Shelby Reeves [place], [place] [address]  Happy fall!!!

## 2015-02-09 NOTE — Progress Notes (Signed)
   Subjective:    Patient ID: Shelby Reeves, female    DOB: [birth year], 69 y.o.   MRN: [id number]  HPI Here today for CPE.  Risk Factors: HTN- chronic problem, on losartan-hctz daily.  Denies CP, SOB, HAs, visual changes, edema. Hyperlipidemia- chronic problem, on pravastatin.  Denies abd pain, N/V, myalgias Obesity- deteriorated.  Pt has gained 15 lbs since last year.  Pt was previously on phentermine.  Admits to no formal diet and no structured exercise plan. 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  Due for flu shot Care team reviewed and updated Labs Ordered: See A&P Care Plan: See A&P    Review of Systems Patient reports no vision/ hearing changes, adenopathy,fever, weight change,  persistant/recurrent hoarseness , swallowing issues, chest pain, palpitations, edema, persistant/recurrent cough, hemoptysis, dyspnea (rest/exertional/paroxysmal nocturnal), gastrointestinal bleeding (melena, rectal bleeding), abdominal pain, significant heartburn, bowel changes, GU symptoms (dysuria, hematuria, incontinence), Gyn symptoms (abnormal  bleeding, pain),  syncope, focal weakness, memory loss, numbness & tingling, skin/hair/nail changes, abnormal bruising or bleeding, anxiety, or depression.     Objective:   Physical Exam General Appearance:    Alert, cooperative, no distress, appears stated age  Head:    Normocephalic, without obvious abnormality, atraumatic  Eyes:    PERRL, conjunctiva/corneas clear, EOM's intact, fundi    benign, both eyes    Ears:    Normal TM's and external ear canals, both ears  Nose:   Nares normal, septum midline, mucosa normal, no drainage    or sinus tenderness  Throat:   Lips, mucosa, and tongue normal; teeth and gums normal  Neck:   Supple, symmetrical, trachea midline, no adenopathy;    Thyroid: no enlargement/tenderness/nodules  Back:     Symmetric, no curvature, ROM normal, no CVA tenderness  Lungs:     Clear to auscultation bilaterally, respirations unlabored  Chest Wall:    No tenderness or deformity   Heart:    Regular rate and rhythm, S1 and S2 normal, no murmur, rub   or gallop  Breast Exam:    Deferred to mammo  Abdomen:     Soft, non-tender, bowel sounds active all four quadrants,    no masses, no organomegaly  Genitalia:    Deferred  Rectal:    Extremities:   Extremities normal, atraumatic, no cyanosis or edema  Pulses:   2+ and symmetric all extremities  Skin:   Skin color, texture, turgor normal, no rashes or lesions  Lymph nodes:   Cervical, supraclavicular, and axillary nodes normal  Neurologic:   CNII-XII intact, normal strength, sensation and reflexes    throughout          Assessment & Plan:

## 2015-02-09 NOTE — Progress Notes (Signed)
Pre visit review using our clinic review tool, if applicable. No additional management support is needed unless otherwise documented below in the visit note. 

## 2015-02-11 NOTE — Assessment & Plan Note (Signed)
Deteriorated.  Pt continues to gain weight.  Stressed need for healthy diet and regular exercise.  Will restart phentermine at pt's request.  Pt will need to f/u in 6 weeks to recheck BP, HR, and weight loss progress.  Pt expressed understanding and is in agreement w/ plan.

## 2015-02-11 NOTE — Assessment & Plan Note (Signed)
Pt's PE WNL w/ exception of obesity.  UTD on colonoscopy.  Due for mammo and DEXA- ordered for downstairs.  Flu shot given.  Written screening schedule updated and given to pt.  Check labs.  Anticipatory guidance provided.

## 2015-02-11 NOTE — Assessment & Plan Note (Signed)
Chronic problem.  Well controlled.  Asymptomatic.  Check labs.  No anticipated med changes. 

## 2015-02-11 NOTE — Assessment & Plan Note (Signed)
Chronic problem.  Tolerating statin.  Stressed need for healthy diet and regular exercise.  Check labs.  Adjust meds prn

## 2015-02-12 ENCOUNTER — Encounter: Payer: Self-pay | Admitting: General Practice

## 2015-02-22 ENCOUNTER — Other Ambulatory Visit: Payer: Self-pay | Admitting: Family Medicine

## 2015-02-22 NOTE — Telephone Encounter (Signed)
Medication filled to pharmacy as requested.   

## 2015-03-07 ENCOUNTER — Other Ambulatory Visit: Payer: Self-pay | Admitting: Family Medicine

## 2015-03-07 NOTE — Telephone Encounter (Signed)
Medication filled to pharmacy as requested.   

## 2015-03-13 ENCOUNTER — Ambulatory Visit (HOSPITAL_BASED_OUTPATIENT_CLINIC_OR_DEPARTMENT_OTHER)
Admission: RE | Admit: 2015-03-13 | Discharge: 2015-03-13 | Disposition: A | Discharge status: Home / Self Care | Payer: Medicare Other | Source: Ambulatory Visit | Attending: Family Medicine | Admitting: Family Medicine

## 2015-03-13 DIAGNOSIS — Z780 Asymptomatic menopausal state: Secondary | ICD-10-CM | POA: Insufficient documentation

## 2015-03-13 DIAGNOSIS — Z1231 Encounter for screening mammogram for malignant neoplasm of breast: Secondary | ICD-10-CM | POA: Diagnosis not present

## 2015-03-14 ENCOUNTER — Encounter: Payer: Self-pay | Admitting: General Practice

## 2015-04-18 ENCOUNTER — Other Ambulatory Visit: Payer: Self-pay | Admitting: Family Medicine

## 2015-04-18 NOTE — Telephone Encounter (Signed)
Medication filled to pharmacy as requested.   

## 2015-06-27 ENCOUNTER — Telehealth: Payer: Self-pay | Admitting: Family Medicine

## 2015-06-27 NOTE — Telephone Encounter (Signed)
Chart updated, pt was not seen in September of 2016.Cannot verify immunization was given.

## 2015-06-27 NOTE — Telephone Encounter (Signed)
Patient stated she received the flu shot in office in September 2016

## 2015-08-30 ENCOUNTER — Other Ambulatory Visit: Payer: Self-pay | Admitting: Family Medicine

## 2015-08-30 NOTE — Telephone Encounter (Signed)
Medication filled to pharmacy as requested.   

## 2015-09-19 ENCOUNTER — Other Ambulatory Visit: Payer: Self-pay | Admitting: Family Medicine

## 2015-09-19 NOTE — Telephone Encounter (Signed)
Medication filled to pharmacy as requested.   

## 2015-10-03 ENCOUNTER — Ambulatory Visit (INDEPENDENT_AMBULATORY_CARE_PROVIDER_SITE_OTHER): Payer: Medicare Other | Admitting: Family Medicine

## 2015-10-03 ENCOUNTER — Encounter: Payer: Self-pay | Admitting: Family Medicine

## 2015-10-03 VITALS — BP 123/81 | HR 82 | Temp 98.6°F | Resp 17 | Ht 68.0 in | Wt 251.2 lb

## 2015-10-03 DIAGNOSIS — E785 Hyperlipidemia, unspecified: Secondary | ICD-10-CM | POA: Diagnosis not present

## 2015-10-03 DIAGNOSIS — IMO0002 Reserved for concepts with insufficient information to code with codable children: Secondary | ICD-10-CM

## 2015-10-03 DIAGNOSIS — I10 Essential (primary) hypertension: Secondary | ICD-10-CM

## 2015-10-03 DIAGNOSIS — L988 Other specified disorders of the skin and subcutaneous tissue: Secondary | ICD-10-CM | POA: Diagnosis not present

## 2015-10-03 LAB — CBC WITH DIFFERENTIAL/PLATELET
BASOS PCT: 0.5 % (ref 0.0–3.0)
Basophils Absolute: 0 10*3/uL (ref 0.0–0.1)
Eosinophils Absolute: 0.1 10*3/uL (ref 0.0–0.7)
Eosinophils Relative: 1.4 % (ref 0.0–5.0)
HEMATOCRIT: 41.1 % (ref 36.0–46.0)
HEMOGLOBIN: 13.9 g/dL (ref 12.0–15.0)
LYMPHS PCT: 27 % (ref 12.0–46.0)
Lymphs Abs: 1.1 10*3/uL (ref 0.7–4.0)
MCHC: 33.9 g/dL (ref 30.0–36.0)
MCV: 96 fl (ref 78.0–100.0)
MONOS PCT: 9.9 % (ref 3.0–12.0)
Monocytes Absolute: 0.4 10*3/uL (ref 0.1–1.0)
NEUTROS ABS: 2.5 10*3/uL (ref 1.4–7.7)
Neutrophils Relative %: 61.2 % (ref 43.0–77.0)
PLATELETS: 266 10*3/uL (ref 150.0–400.0)
RBC: 4.28 Mil/uL (ref 3.87–5.11)
RDW: 13.9 % (ref 11.5–15.5)
WBC: 4.1 10*3/uL (ref 4.0–10.5)

## 2015-10-03 LAB — BASIC METABOLIC PANEL
BUN: 14 mg/dL (ref 6–23)
CHLORIDE: 102 meq/L (ref 96–112)
CO2: 30 mEq/L (ref 19–32)
CREATININE: 0.69 mg/dL (ref 0.40–1.20)
Calcium: 10 mg/dL (ref 8.4–10.5)
GFR: 89.09 mL/min (ref >60.00)
Glucose, Bld: 95 mg/dL (ref 70–99)
Potassium: 4.2 mEq/L (ref 3.5–5.1)
Sodium: 138 mEq/L (ref 135–145)

## 2015-10-03 LAB — HEPATIC FUNCTION PANEL
ALT: 24 U/L (ref 0–35)
AST: 22 U/L (ref 0–37)
Albumin: 4.3 g/dL (ref 3.5–5.2)
Alkaline Phosphatase: 79 U/L (ref 39–117)
BILIRUBIN DIRECT: 0.2 mg/dL (ref 0.0–0.3)
BILIRUBIN TOTAL: 1.3 mg/dL — AB (ref 0.2–1.2)
Total Protein: 6.9 g/dL (ref 6.0–8.3)

## 2015-10-03 LAB — LIPID PANEL
CHOL/HDL RATIO: 3
Cholesterol: 145 mg/dL (ref 0–200)
HDL: 53 mg/dL (ref >39.00)
LDL Cholesterol: 72 mg/dL (ref 0–99)
NONHDL: 91.82
Triglycerides: 97 mg/dL (ref 0.0–149.0)
VLDL: 19.4 mg/dL (ref 0.0–40.0)

## 2015-10-03 LAB — TSH: TSH: 3 u[IU]/mL (ref 0.35–4.50)

## 2015-10-03 NOTE — Patient Instructions (Signed)
Schedule your complete physical in 6 months We'll notify you of your lab results and make any changes if needed Continue to work on healthy diet and regular exercise- you can do it! We'll call you with your hand appt for the cyst on your finger Call with any questions or concerns Have a great summer!

## 2015-10-03 NOTE — Progress Notes (Signed)
   Subjective:    Patient ID: Shelby Reeves, female    DOB: 1945-05-17, 70 y.o.   MRN: [id number]  HPI HTN- chronic problem.  On HCTZ and Losaratan HCTZ w/ good control.  No CP, SOB, HAs, visual changes, edema  Hyperlipidemia- chronic problem, on Pravastatin daily.  No abd pain, N/V, myalgias.  Pt has lost 15 lbs since last visit!  R hand pain- pt has small cystic area on 1st finger at DIP joint.  TTP, some difficulty w/ flexion of DIP joint     Review of Systems For ROS see HPI      Objective:   Physical Exam  Constitutional: She is oriented to person, place, and time. She appears well-developed and well-nourished. No distress.  obese  HENT:  Head: Normocephalic and atraumatic.  Eyes: Conjunctivae and EOM are normal. Pupils are equal, round, and reactive to light.  Neck: Normal range of motion. Neck supple. No thyromegaly present.  Cardiovascular: Normal rate, regular rhythm, normal heart sounds and intact distal pulses.   No murmur heard. Pulmonary/Chest: Effort normal and breath sounds normal. No respiratory distress.  Abdominal: Soft. She exhibits no distension. There is no tenderness.  Musculoskeletal: She exhibits tenderness (mild TTP over 0.5cm cystic area on dorsum of R 1st finger at DIP joint). She exhibits no edema.  Lymphadenopathy:    She has no cervical adenopathy.  Neurological: She is alert and oriented to person, place, and time.  Skin: Skin is warm and dry.  Psychiatric: She has a normal mood and affect. Her behavior is normal.  Vitals reviewed.         Assessment & Plan:

## 2015-10-03 NOTE — Progress Notes (Signed)
Pre visit review using our clinic review tool, if applicable. No additional management support is needed unless otherwise documented below in the visit note. 

## 2015-10-04 ENCOUNTER — Encounter: Payer: Self-pay | Admitting: General Practice

## 2015-10-04 NOTE — Assessment & Plan Note (Signed)
Chronic problem.  Tolerating statin w/o difficulty.  Applauded her weight loss and encouraged continued healthy diet and regular exercise.  Check labs.  Adjust meds prn

## 2015-10-04 NOTE — Assessment & Plan Note (Signed)
Chronic problem.  Well controlled.  Asymptomatic at this time. Check labs.  No anticipated med changes. 

## 2015-10-04 NOTE — Assessment & Plan Note (Signed)
New.  Refer to hand specialist for complete evaluation and tx.  Pt expressed understanding and is in agreement w/ plan.

## 2015-10-11 DIAGNOSIS — M67441 Ganglion, right hand: Secondary | ICD-10-CM | POA: Diagnosis not present

## 2015-10-22 ENCOUNTER — Other Ambulatory Visit: Payer: Self-pay | Admitting: Orthopedic Surgery

## 2015-10-22 DIAGNOSIS — M67441 Ganglion, right hand: Secondary | ICD-10-CM | POA: Diagnosis not present

## 2015-10-22 DIAGNOSIS — M19041 Primary osteoarthritis, right hand: Secondary | ICD-10-CM | POA: Diagnosis not present

## 2015-10-26 DIAGNOSIS — H40013 Open angle with borderline findings, low risk, bilateral: Secondary | ICD-10-CM | POA: Diagnosis not present

## 2015-10-26 DIAGNOSIS — H2513 Age-related nuclear cataract, bilateral: Secondary | ICD-10-CM | POA: Diagnosis not present

## 2015-10-26 DIAGNOSIS — H2511 Age-related nuclear cataract, right eye: Secondary | ICD-10-CM | POA: Diagnosis not present

## 2015-10-28 ENCOUNTER — Other Ambulatory Visit: Payer: Self-pay | Admitting: Family Medicine

## 2015-10-29 DIAGNOSIS — H9202 Otalgia, left ear: Secondary | ICD-10-CM | POA: Diagnosis not present

## 2015-10-31 ENCOUNTER — Encounter: Payer: Self-pay | Admitting: Family Medicine

## 2015-10-31 ENCOUNTER — Ambulatory Visit (INDEPENDENT_AMBULATORY_CARE_PROVIDER_SITE_OTHER): Payer: Medicare Other | Admitting: Family Medicine

## 2015-10-31 VITALS — BP 122/86 | HR 83 | Temp 98.3°F | Resp 16 | Ht 68.0 in | Wt 256.5 lb

## 2015-10-31 DIAGNOSIS — H66012 Acute suppurative otitis media with spontaneous rupture of ear drum, left ear: Secondary | ICD-10-CM | POA: Diagnosis not present

## 2015-10-31 DIAGNOSIS — H6092 Unspecified otitis externa, left ear: Secondary | ICD-10-CM | POA: Diagnosis not present

## 2015-10-31 MED ORDER — NEOMYCIN-POLYMYXIN-HC 3.5-10000-1 OT SUSP: 3.0000 [drp] | Freq: Three times a day (TID) | OTIC | Status: DC

## 2015-10-31 MED ORDER — HYDROCHLOROTHIAZIDE 12.5 MG PO TABS: 12.5000 mg | ORAL_TABLET | Freq: Every day | ORAL | Status: DC

## 2015-10-31 MED ORDER — AMOXICILLIN 875 MG PO TABS: 875.0000 mg | ORAL_TABLET | Freq: Two times a day (BID) | ORAL | Status: DC

## 2015-10-31 MED ORDER — PRAVASTATIN SODIUM 40 MG PO TABS: 40.0000 mg | ORAL_TABLET | Freq: Every day | ORAL | Status: DC

## 2015-10-31 NOTE — Telephone Encounter (Signed)
Medication filled to pharmacy as requested.   

## 2015-10-31 NOTE — Patient Instructions (Signed)
Follow up as needed Start the Amoxicillin twice daily- take w/ food Use the ear drops (4 drops) 3x/day x10 days Call with any questions or concerns Hang in there!!!

## 2015-10-31 NOTE — Progress Notes (Signed)
   Subjective:    Patient ID: Shelby Reeves, female    DOB: 01-07-1946, 70 y.o.   MRN: [id number]  HPI L ear pain- sxs started Saturday.  Pain resolved spontaneously yesterday but continues to feel clogged.  + drainage from ear.  Pt went to [facility] 'waste of time'.  Pt was found to have thick, white drainage in front of TM.  No pain w/ manipulation of L outer ear.  No recent swimming.  No fevers   Review of Systems For ROS see HPI     Objective:   Physical Exam  Constitutional: She is oriented to person, place, and time. She appears well-developed and well-nourished. No distress.  HENT:  Head: Normocephalic and atraumatic.  Right Ear: External ear normal.  R TM WNL L ear canal full of thick white debris w/ darker areas interspersed.  Copious amount of debris removed w/ curette revealing thin, purulent drainage closer to TM but it is impossible to determine if pus is anterior to TM or posterior as TM is not fully visualized.  Eyes: Conjunctivae and EOM are normal. Pupils are equal, round, and reactive to light.  Neurological: She is alert and oriented to person, place, and time.  Skin: Skin is warm and dry.  Psychiatric: She has a normal mood and affect. Her behavior is normal. Thought content normal.  Vitals reviewed.         Assessment & Plan:  L otitis externa/media- pt w/ obvious purulent ear infxn but it is impossible to tell if pus is anterior or posterior to TM.  Given her spontaneous resolution of pain, concern for perforated TM.  Start oral abx and ear drops to cover both otitis media and externa.  Reviewed supportive care and red flags that should prompt return.  Pt expressed understanding and is in agreement w/ plan.

## 2015-10-31 NOTE — Progress Notes (Signed)
Pre visit review using our clinic review tool, if applicable. No additional management support is needed unless otherwise documented below in the visit note. 

## 2015-11-13 ENCOUNTER — Encounter: Payer: Self-pay | Admitting: Family Medicine

## 2015-11-14 DIAGNOSIS — H2511 Age-related nuclear cataract, right eye: Secondary | ICD-10-CM | POA: Diagnosis not present

## 2015-11-14 DIAGNOSIS — H25811 Combined forms of age-related cataract, right eye: Secondary | ICD-10-CM | POA: Diagnosis not present

## 2015-11-19 DIAGNOSIS — H25042 Posterior subcapsular polar age-related cataract, left eye: Secondary | ICD-10-CM | POA: Diagnosis not present

## 2015-11-19 DIAGNOSIS — H25012 Cortical age-related cataract, left eye: Secondary | ICD-10-CM | POA: Diagnosis not present

## 2015-11-19 DIAGNOSIS — H2512 Age-related nuclear cataract, left eye: Secondary | ICD-10-CM | POA: Diagnosis not present

## 2015-11-28 DIAGNOSIS — H25812 Combined forms of age-related cataract, left eye: Secondary | ICD-10-CM | POA: Diagnosis not present

## 2015-11-28 DIAGNOSIS — H2512 Age-related nuclear cataract, left eye: Secondary | ICD-10-CM | POA: Diagnosis not present

## 2015-12-10 DIAGNOSIS — M67441 Ganglion, right hand: Secondary | ICD-10-CM | POA: Insufficient documentation

## 2016-02-04 ENCOUNTER — Encounter: Payer: Medicare Other | Admitting: Family Medicine

## 2016-02-14 ENCOUNTER — Encounter: Payer: Self-pay | Admitting: Family Medicine

## 2016-02-14 ENCOUNTER — Other Ambulatory Visit: Payer: Self-pay | Admitting: Family Medicine

## 2016-02-14 ENCOUNTER — Ambulatory Visit (INDEPENDENT_AMBULATORY_CARE_PROVIDER_SITE_OTHER): Payer: Medicare Other | Admitting: Family Medicine

## 2016-02-14 VITALS — BP 120/82 | HR 66 | Temp 98.1°F | Resp 16 | Ht 68.0 in | Wt 274.0 lb

## 2016-02-14 DIAGNOSIS — Z1231 Encounter for screening mammogram for malignant neoplasm of breast: Secondary | ICD-10-CM

## 2016-02-14 DIAGNOSIS — E785 Hyperlipidemia, unspecified: Secondary | ICD-10-CM | POA: Diagnosis not present

## 2016-02-14 DIAGNOSIS — I10 Essential (primary) hypertension: Secondary | ICD-10-CM

## 2016-02-14 DIAGNOSIS — E010 Iodine-deficiency related diffuse (endemic) goiter: Secondary | ICD-10-CM

## 2016-02-14 DIAGNOSIS — Z23 Encounter for immunization: Secondary | ICD-10-CM | POA: Diagnosis not present

## 2016-02-14 DIAGNOSIS — Z0000 Encounter for general adult medical examination without abnormal findings: Secondary | ICD-10-CM | POA: Diagnosis not present

## 2016-02-14 DIAGNOSIS — N906 Unspecified hypertrophy of vulva: Secondary | ICD-10-CM

## 2016-02-14 LAB — CBC WITH DIFFERENTIAL/PLATELET
BASOS PCT: 0.5 % (ref 0.0–3.0)
Basophils Absolute: 0 10*3/uL (ref 0.0–0.1)
EOS PCT: 1.7 % (ref 0.0–5.0)
Eosinophils Absolute: 0.1 10*3/uL (ref 0.0–0.7)
HEMATOCRIT: 40.9 % (ref 36.0–46.0)
HEMOGLOBIN: 14 g/dL (ref 12.0–15.0)
LYMPHS PCT: 24.7 % (ref 12.0–46.0)
Lymphs Abs: 1 10*3/uL (ref 0.7–4.0)
MCHC: 34.3 g/dL (ref 30.0–36.0)
MCV: 95.9 fl (ref 78.0–100.0)
Monocytes Absolute: 0.4 10*3/uL (ref 0.1–1.0)
Monocytes Relative: 8.6 % (ref 3.0–12.0)
NEUTROS ABS: 2.7 10*3/uL (ref 1.4–7.7)
Neutrophils Relative %: 64.5 % (ref 43.0–77.0)
PLATELETS: 275 10*3/uL (ref 150.0–400.0)
RBC: 4.27 Mil/uL (ref 3.87–5.11)
RDW: 14.8 % (ref 11.5–15.5)
WBC: 4.2 10*3/uL (ref 4.0–10.5)

## 2016-02-14 LAB — BASIC METABOLIC PANEL
BUN: 14 mg/dL (ref 6–23)
CHLORIDE: 102 meq/L (ref 96–112)
CO2: 30 meq/L (ref 19–32)
CREATININE: 0.7 mg/dL (ref 0.40–1.20)
Calcium: 10 mg/dL (ref 8.4–10.5)
GFR: 87.54 mL/min (ref >60.00)
Glucose, Bld: 104 mg/dL — ABNORMAL HIGH (ref 70–99)
Potassium: 4.2 mEq/L (ref 3.5–5.1)
SODIUM: 140 meq/L (ref 135–145)

## 2016-02-14 LAB — LIPID PANEL
CHOL/HDL RATIO: 3
Cholesterol: 140 mg/dL (ref 0–200)
HDL: 52.7 mg/dL (ref >39.00)
LDL Cholesterol: 63 mg/dL (ref 0–99)
NONHDL: 87.3
Triglycerides: 120 mg/dL (ref 0.0–149.0)
VLDL: 24 mg/dL (ref 0.0–40.0)

## 2016-02-14 LAB — HEPATIC FUNCTION PANEL
ALT: 30 U/L (ref 0–35)
AST: 28 U/L (ref 0–37)
Albumin: 4.5 g/dL (ref 3.5–5.2)
Alkaline Phosphatase: 73 U/L (ref 39–117)
BILIRUBIN DIRECT: 0.2 mg/dL (ref 0.0–0.3)
BILIRUBIN TOTAL: 1.3 mg/dL — AB (ref 0.2–1.2)
Total Protein: 7.1 g/dL (ref 6.0–8.3)

## 2016-02-14 LAB — TSH: TSH: 3.81 u[IU]/mL (ref 0.35–4.50)

## 2016-02-14 LAB — HEMOGLOBIN A1C: Hgb A1c MFr Bld: 6 % (ref 4.6–6.5)

## 2016-02-14 MED ORDER — PHENTERMINE HCL 37.5 MG PO CAPS: 37.5000 mg | ORAL_CAPSULE | ORAL | 2 refills | Status: DC

## 2016-02-14 NOTE — Assessment & Plan Note (Signed)
Chronic problem.  Well controlled today.  Asymptomatic.  Stressed need for healthy diet and regular exercise as pt continues to gain weight.  Check labs.  No anticipated med changes.  Will follow.

## 2016-02-14 NOTE — Progress Notes (Signed)
Pre visit review using our clinic review tool, if applicable. No additional management support is needed unless otherwise documented below in the visit note. 

## 2016-02-14 NOTE — Progress Notes (Signed)
   Subjective:    Patient ID: Shelby Reeves, female    DOB: 1946-01-20, 70 y.o.   MRN: [id number]  HPI Here today for CPE.  Risk Factors: HTN- chronic problem, on Losartan HCTZ daily w/ good control Hyperlipidemia- chronic problem, on Pravastatin daily Obesity- chronic problem, pt has gained nearly 20 lbs since July Physical Activity: no formal exercise Fall Risk: low Depression: denies current sxs.  Pt is now off Prozac Hearing: mildly decreased to whispered voice, normal to conversational tones w/ hearing aides ADL's: independent Cognitive: normal linear thought process, memory and attention intact Home Safety: safe at home Height, Weight, BMI, Visual Acuity: see vitals, vision corrected to 20/20 w/ cataract surgery Counseling: UTD on mammo, DEXA, colonoscopy (due 2023), Prevnar.  Pt to get flu shot today Care team reviewed and updated w/ pt Labs Ordered: See A&P Care Plan: See A&P    Review of Systems Patient reports no vision/ hearing changes, adenopathy,fever, weight change,  persistant/recurrent hoarseness , swallowing issues, chest pain, palpitations, edema, persistant/recurrent cough, hemoptysis, dyspnea (rest/exertional/paroxysmal nocturnal), gastrointestinal bleeding (melena, rectal bleeding), abdominal pain, significant heartburn, bowel changes, GU symptoms (dysuria, hematuria, incontinence), Gyn symptoms (abnormal  bleeding, pain),  syncope, focal weakness, memory loss, numbness & tingling, skin/hair/nail changes, abnormal bruising or bleeding, anxiety, or depression.     Objective:   Physical Exam        Assessment & Plan:

## 2016-02-14 NOTE — Assessment & Plan Note (Signed)
Chronic problem.  Tolerating pravastatin w/o difficulty.  Check labs.  Adjust meds prn  

## 2016-02-14 NOTE — Assessment & Plan Note (Signed)
Pt's PE WNL w/ exception of obesity.  No vaginal abnormality- even w/ pt in squatting position.  Will refer to GYN for evaluation.  UTD on mammo, DEXA, colonoscopy.  Written screening schedule updated and given to pt.  Flu shot given.  Check labs.  Anticipatory guidance provided.

## 2016-02-14 NOTE — Assessment & Plan Note (Signed)
Deteriorated.  Pt has gained nearly 20 lbs since July.  She admits to poor dietary habits and no exercise.  Stressed need for both.  Pt is asking for refill on Phentermine.  Stressed that this will not work unless coupled w/ diet and exercise.  Prescription given.  Will follow.

## 2016-02-14 NOTE — Patient Instructions (Addendum)
Follow up in 6 months to recheck BP and cholesterol We'll notify you of your lab results and make any changes if needed Continue to work on healthy diet and regular exercise- you can do it!!! We'll call you with your ultrasound appt for the thyroid nodule We'll call you with your GYN appt for the labial protrusion You are due for mammogram next month, bone density 2018, and colonoscopy 2023- yay! Call with any questions or concerns Happy Fall!!!

## 2016-02-15 ENCOUNTER — Encounter: Payer: Self-pay | Admitting: General Practice

## 2016-02-15 ENCOUNTER — Ambulatory Visit (HOSPITAL_BASED_OUTPATIENT_CLINIC_OR_DEPARTMENT_OTHER)
Admission: RE | Admit: 2016-02-15 | Discharge: 2016-02-15 | Disposition: A | Discharge status: Home / Self Care | Payer: Medicare Other | Source: Ambulatory Visit | Attending: Family Medicine | Admitting: Family Medicine

## 2016-02-15 DIAGNOSIS — E041 Nontoxic single thyroid nodule: Secondary | ICD-10-CM | POA: Diagnosis not present

## 2016-02-15 DIAGNOSIS — E010 Iodine-deficiency related diffuse (endemic) goiter: Secondary | ICD-10-CM | POA: Diagnosis not present

## 2016-02-18 ENCOUNTER — Other Ambulatory Visit: Payer: Self-pay | Admitting: Family Medicine

## 2016-02-18 DIAGNOSIS — E079 Disorder of thyroid, unspecified: Secondary | ICD-10-CM

## 2016-03-04 DIAGNOSIS — E079 Disorder of thyroid, unspecified: Secondary | ICD-10-CM | POA: Diagnosis not present

## 2016-03-05 ENCOUNTER — Other Ambulatory Visit: Payer: Self-pay | Admitting: Otolaryngology

## 2016-03-05 DIAGNOSIS — E041 Nontoxic single thyroid nodule: Secondary | ICD-10-CM

## 2016-03-12 ENCOUNTER — Ambulatory Visit
Admission: RE | Admit: 2016-03-12 | Discharge: 2016-03-12 | Disposition: A | Discharge status: Home / Self Care | Payer: Medicare Other | Source: Ambulatory Visit | Attending: Otolaryngology | Admitting: Otolaryngology

## 2016-03-12 ENCOUNTER — Other Ambulatory Visit (HOSPITAL_COMMUNITY)
Admission: RE | Admit: 2016-03-12 | Discharge: 2016-03-12 | Disposition: A | Discharge status: Home / Self Care | Payer: Medicare Other | Source: Ambulatory Visit | Attending: Physician Assistant | Admitting: Physician Assistant

## 2016-03-12 DIAGNOSIS — E041 Nontoxic single thyroid nodule: Secondary | ICD-10-CM | POA: Diagnosis not present

## 2016-03-12 NOTE — Procedures (Signed)
Using direct ultrasound guidance, 5 passes were made using 25 g needles into the nodule within the right lobe of the thyroid.   Ultrasound was used to confirm needle placements on all occasions.   Specimens were sent to Pathology for analysis.   Shelby Reeves S Keedan Sample PA-C 03/12/2016 1:35 PM

## 2016-03-14 ENCOUNTER — Ambulatory Visit (HOSPITAL_BASED_OUTPATIENT_CLINIC_OR_DEPARTMENT_OTHER): Payer: Medicare Other

## 2016-03-17 DIAGNOSIS — Z6841 Body Mass Index (BMI) 40.0 and over, adult: Secondary | ICD-10-CM | POA: Diagnosis not present

## 2016-03-17 DIAGNOSIS — Z124 Encounter for screening for malignant neoplasm of cervix: Secondary | ICD-10-CM | POA: Diagnosis not present

## 2016-03-17 DIAGNOSIS — Z01419 Encounter for gynecological examination (general) (routine) without abnormal findings: Secondary | ICD-10-CM | POA: Diagnosis not present

## 2016-03-17 LAB — HM PAP SMEAR: HM Pap smear: NORMAL

## 2016-03-25 DIAGNOSIS — E079 Disorder of thyroid, unspecified: Secondary | ICD-10-CM | POA: Diagnosis not present

## 2016-03-31 ENCOUNTER — Ambulatory Visit (HOSPITAL_BASED_OUTPATIENT_CLINIC_OR_DEPARTMENT_OTHER)
Admission: RE | Admit: 2016-03-31 | Discharge: 2016-03-31 | Disposition: A | Discharge status: Home / Self Care | Payer: Medicare Other | Source: Ambulatory Visit | Attending: Family Medicine | Admitting: Family Medicine

## 2016-03-31 DIAGNOSIS — Z1231 Encounter for screening mammogram for malignant neoplasm of breast: Secondary | ICD-10-CM | POA: Diagnosis not present

## 2016-04-24 ENCOUNTER — Telehealth: Payer: Self-pay | Admitting: Family Medicine

## 2016-04-24 NOTE — Telephone Encounter (Signed)
[facility] Patient Name: Shelby Reeves DOB: 07-Nov-1945 Initial Comment Caller had sinus infection a few weeks ago, still has cough with clear mucus. Nurse Assessment Nurse: Martyn Ehrich RN, Felicia Date/Time (Eastern Time): 04/24/2016 5:23:37 PM Confirm and document reason for call. If symptomatic, describe symptoms. ---PT had a sinus infection a few weeks ago and is still coughing from that - no fever and mucous is clear. She was never on antx for it. Coughing for 3 weeks. Sinus symptoms and fever was about 3 wks ago. Does the patient have any new or worsening symptoms? ---Yes Will a triage be completed? ---Yes Related visit to physician within the last 2 weeks? ---No Does the PT have any chronic conditions? (i.e. diabetes, asthma, etc.) ---No Is this a behavioral health or substance abuse call? ---No Guidelines Guideline TitleAffirmed Question Affirmed Notes Cough - Acute Productive Cough present > 10 days Final Disposition User See PCP When Office is Open (within 3 days) Gaddy, RN, Solmon Ice Comments was unable to find [facility] location on schedule location - Pt will call back tom to make appt Disagree/Comply: Comply Call Id: [id number]

## 2016-04-25 NOTE — Telephone Encounter (Signed)
Patient declined appointment, stating she went to pharmacy last night and purchased Delsom for the cough she has.  Per patient the cough is the only lingering symptom she has.  She is also taking Muccinex.  She reports her cough was much better last night after taking the Delsom.  She would like to continue taking the otc medication along with honey and lemon.  If she does not feel better within the next few days, she will call back for an appointment with pcp.

## 2016-04-25 NOTE — Telephone Encounter (Signed)
Can you cal land see if we can schedule pt at another location since we are full?

## 2016-04-28 HISTORY — PX: CATARACT EXTRACTION W/ INTRAOCULAR LENS  IMPLANT, BILATERAL: SHX1307

## 2016-04-29 ENCOUNTER — Encounter: Payer: Self-pay | Admitting: Physician Assistant

## 2016-04-29 ENCOUNTER — Other Ambulatory Visit: Payer: Self-pay | Admitting: Family Medicine

## 2016-04-29 ENCOUNTER — Ambulatory Visit (INDEPENDENT_AMBULATORY_CARE_PROVIDER_SITE_OTHER): Payer: Medicare Other | Admitting: Physician Assistant

## 2016-04-29 VITALS — BP 142/80 | HR 70 | Temp 98.2°F | Resp 16 | Ht 68.0 in | Wt 267.0 lb

## 2016-04-29 DIAGNOSIS — J208 Acute bronchitis due to other specified organisms: Secondary | ICD-10-CM

## 2016-04-29 DIAGNOSIS — B9689 Other specified bacterial agents as the cause of diseases classified elsewhere: Secondary | ICD-10-CM | POA: Diagnosis not present

## 2016-04-29 MED ORDER — ALBUTEROL SULFATE HFA 108 (90 BASE) MCG/ACT IN AERS: 2.0000 | INHALATION_SPRAY | Freq: Four times a day (QID) | RESPIRATORY_TRACT | 0 refills | Status: DC | PRN

## 2016-04-29 MED ORDER — DOXYCYCLINE HYCLATE 100 MG PO CAPS: 100.0000 mg | ORAL_CAPSULE | Freq: Two times a day (BID) | ORAL | 0 refills | Status: DC

## 2016-04-29 MED ORDER — BENZONATATE 100 MG PO CAPS
100.0000 mg | ORAL_CAPSULE | Freq: Three times a day (TID) | ORAL | 0 refills | Status: DC | PRN
Start: 2016-04-29 — End: 2016-08-13

## 2016-04-29 MED ORDER — ALBUTEROL SULFATE (2.5 MG/3ML) 0.083% IN NEBU
2.5000 mg | INHALATION_SOLUTION | Freq: Once | RESPIRATORY_TRACT | Status: AC
  Administered 2016-04-29: 2.5 mg via RESPIRATORY_TRACT

## 2016-04-29 NOTE — Progress Notes (Signed)
Patient presents to clinic today c/o 3 weeks of chest congestion with dry cough that has become productive of yellow sputum. Denies fever, chills. Notes some initially sinus pressure, rhinorrhea that have since resolved. Denies chest pain. Denies noted SOB. Denies history of asthma or COPD. Is not a smoker. Denies recent travel or sick contact. Has taken Mucinex and Delsym for symptom relief.   Past Medical History:  Diagnosis Date  . Allergy   . Carpal tunnel syndrome   . Fibroid   . History of colon polyps 2002  . Hyperlipidemia   . Hypertension   . Vertigo     Current Outpatient Prescriptions on File Prior to Visit  Medication Sig Dispense Refill  . aspirin 81 MG tablet Take 81 mg by mouth daily.      . hydrochlorothiazide (HYDRODIURIL) 12.5 MG tablet Take 1 tablet (12.5 mg total) by mouth daily. 90 tablet 1  . losartan-hydrochlorothiazide (HYZAAR) 50-12.5 MG tablet take 1 tablet by mouth once daily 90 tablet 1  . Multiple Vitamin (MULTIVITAMIN) tablet Take 1 tablet by mouth daily.      . pantoprazole (PROTONIX) 40 MG tablet Take 40 mg by mouth daily as needed. Reported on 10/03/2015    . phentermine 37.5 MG capsule Take 1 capsule (37.5 mg total) by mouth every morning. 30 capsule 2  . pravastatin (PRAVACHOL) 40 MG tablet Take 1 tablet (40 mg total) by mouth daily. 90 tablet 1  . [DISCONTINUED] lisinopril-hydrochlorothiazide (PRINZIDE,ZESTORETIC) 10-12.5 MG per tablet Take 1 tablet by mouth daily. 90 tablet 3   No current facility-administered medications on file prior to visit.     Allergies  Allergen Reactions  . Iodine Hives  . Shellfish Allergy Hives    Family History  Problem Relation Age of Onset  . Heart disease Mother   . Hypertension Mother   . Diabetes Mother   . Cancer Father     colon cancer?  . Stroke Sister   . Lung cancer Sister   . Colon cancer Neg Hx   . Stomach cancer Neg Hx     Social History   Social History  . Marital status: Widowed   Spouse name: N/A  . Number of children: N/A  . Years of education: N/A   Social History Main Topics  . Smoking status: Never Smoker  . Smokeless tobacco: Never Used  . Alcohol use Yes     Comment: rarely  . Drug use: No  . Sexual activity: Yes    Partners: Male   Other Topics Concern  . None   Social History Narrative  . None   Review of Systems - See HPI.  All other ROS are negative.  BP (!) 142/80   Pulse 70   Temp 98.2 F (36.8 C) (Oral)   Resp 16   Ht 5\' 8"  (1.727 m)   Wt 267 lb (121.1 kg)   SpO2 93%   BMI 40.60 kg/m   Physical Exam  Constitutional: She is oriented to person, place, and time and well-developed, well-nourished, and in no distress.  HENT:  Head: Normocephalic.  Eyes: Conjunctivae are normal.  Cardiovascular: Normal rate, regular rhythm, normal heart sounds and intact distal pulses.   Pulmonary/Chest: Effort normal and breath sounds normal. No respiratory distress. She has no wheezes. She has no rales. She exhibits no tenderness.  Neurological: She is alert and oriented to person, place, and time.  Skin: Skin is warm and dry. No rash noted.  Psychiatric: Affect normal.  Vitals  reviewed.   Recent Results (from the past 2160 hour(s))  Hemoglobin A1c     Status: None   Collection Time: 02/14/16 11:01 AM  Result Value Ref Range   Hgb A1c MFr Bld 6.0 4.6 - 6.5 %    Comment: Glycemic Control Guidelines for People with Diabetes:Non Diabetic:  <6%Goal of Therapy: <7%Additional Action Suggested:  >8%   Lipid panel     Status: None   Collection Time: 02/14/16 11:01 AM  Result Value Ref Range   Cholesterol 140 0 - 200 mg/dL    Comment: ATP III Classification       Desirable:  < 200 mg/dL               Borderline High:  200 - 239 mg/dL          High:  > = 240 mg/dL   Triglycerides 120.0 0.0 - 149.0 mg/dL    Comment: Normal:  <150 mg/dLBorderline High:  150 - 199 mg/dL   HDL 52.70 >39.00 mg/dL   VLDL 24.0 0.0 - 40.0 mg/dL   LDL Cholesterol 63 0 - 99  mg/dL   Total CHOL/HDL Ratio 3     Comment:                Men          Women1/2 Average Risk     3.4          3.3Average Risk          5.0          4.42X Average Risk          9.6          7.13X Average Risk          15.0          11.0                       NonHDL 87.30     Comment: NOTE:  Non-HDL goal should be 30 mg/dL higher than patient's LDL goal (i.e. LDL goal of < 70 mg/dL, would have non-HDL goal of < 100 mg/dL)  Basic metabolic panel     Status: Abnormal   Collection Time: 02/14/16 11:01 AM  Result Value Ref Range   Sodium 140 135 - 145 mEq/L   Potassium 4.2 3.5 - 5.1 mEq/L   Chloride 102 96 - 112 mEq/L   CO2 30 19 - 32 mEq/L   Glucose, Bld 104 (H) 70 - 99 mg/dL   BUN 14 6 - 23 mg/dL   Creatinine, Ser 0.70 0.40 - 1.20 mg/dL   Calcium 10.0 8.4 - 10.5 mg/dL   GFR 87.54 >60.00 mL/min  TSH     Status: None   Collection Time: 02/14/16 11:01 AM  Result Value Ref Range   TSH 3.81 0.35 - 4.50 uIU/mL  Hepatic function panel     Status: Abnormal   Collection Time: 02/14/16 11:01 AM  Result Value Ref Range   Total Bilirubin 1.3 (H) 0.2 - 1.2 mg/dL   Bilirubin, Direct 0.2 0.0 - 0.3 mg/dL   Alkaline Phosphatase 73 39 - 117 U/L   AST 28 0 - 37 U/L   ALT 30 0 - 35 U/L   Total Protein 7.1 6.0 - 8.3 g/dL   Albumin 4.5 3.5 - 5.2 g/dL  CBC with Differential/Platelet     Status: None   Collection Time: 02/14/16 11:01 AM  Result Value Ref Range   WBC  4.2 4.0 - 10.5 K/uL   RBC 4.27 3.87 - 5.11 Mil/uL   Hemoglobin 14.0 12.0 - 15.0 g/dL   HCT 40.9 36.0 - 46.0 %   MCV 95.9 78.0 - 100.0 fl   MCHC 34.3 30.0 - 36.0 g/dL   RDW 14.8 11.5 - 15.5 %   Platelets 275.0 150.0 - 400.0 K/uL   Neutrophils Relative % 64.5 43.0 - 77.0 %   Lymphocytes Relative 24.7 12.0 - 46.0 %   Monocytes Relative 8.6 3.0 - 12.0 %   Eosinophils Relative 1.7 0.0 - 5.0 %   Basophils Relative 0.5 0.0 - 3.0 %   Neutro Abs 2.7 1.4 - 7.7 K/uL   Lymphs Abs 1.0 0.7 - 4.0 K/uL   Monocytes Absolute 0.4 0.1 - 1.0 K/uL    Eosinophils Absolute 0.1 0.0 - 0.7 K/uL   Basophils Absolute 0.0 0.0 - 0.1 K/uL    Assessment/Plan: 1. Acute bacterial bronchitis Albuterol neb given due to tightness. O2 improved from 93% to 97% on RA. Lungs CTAB. Rx Doxycycline. Tessalon and Albuterol per orders. OTC medications and supportive measures reviewed. FU if not improving.  - doxycycline (VIBRAMYCIN) 100 MG capsule; Take 1 capsule (100 mg total) by mouth 2 (two) times daily.  Dispense: 14 capsule; Refill: 0 - benzonatate (TESSALON) 100 MG capsule; Take 1 capsule (100 mg total) by mouth 3 (three) times daily as needed.  Dispense: 30 capsule; Refill: 0 - albuterol (PROVENTIL HFA;VENTOLIN HFA) 108 (90 Base) MCG/ACT inhaler; Inhale 2 puffs into the lungs every 6 (six) hours as needed for wheezing or shortness of breath.  Dispense: 1 Inhaler; Refill: 0 - albuterol (PROVENTIL) (2.5 MG/3ML) 0.083% nebulizer solution 2.5 mg; Take 3 mLs (2.5 mg total) by nebulization once.   Leeanne Rio, PA-C

## 2016-04-29 NOTE — Progress Notes (Signed)
Pre visit review using our clinic review tool, if applicable. No additional management support is needed unless otherwise documented below in the visit note. 

## 2016-04-29 NOTE — Patient Instructions (Signed)
Take antibiotic (Doxycycline) as directed.  Increase fluids.  Get plenty of rest. Use Tessalon as directed for cough. Use albuterol as directed if needed for chest tightness. Take a daily probiotic (I recommend Align or Culturelle, but even Activia Yogurt may be beneficial).  A humidifier placed in the bedroom may offer some relief for a dry, scratchy throat of nasal irritation.  Read information below on acute bronchitis. Please call or return to clinic if symptoms are not improving.  Acute Bronchitis Bronchitis is when the airways that extend from the windpipe into the lungs get red, puffy, and painful (inflamed). Bronchitis often causes thick spit (mucus) to develop. This leads to a cough. A cough is the most common symptom of bronchitis. In acute bronchitis, the condition usually begins suddenly and goes away over time (usually in 2 weeks). Smoking, allergies, and asthma can make bronchitis worse. Repeated episodes of bronchitis may cause more lung problems.  HOME CARE  Rest.  Drink enough fluids to keep your pee (urine) clear or pale yellow (unless you need to limit fluids as told by your doctor).  Only take over-the-counter or prescription medicines as told by your doctor.  Avoid smoking and secondhand smoke. These can make bronchitis worse. If you are a smoker, think about using nicotine gum or skin patches. Quitting smoking will help your lungs heal faster.  Reduce the chance of getting bronchitis again by:  Washing your hands often.  Avoiding people with cold symptoms.  Trying not to touch your hands to your mouth, nose, or eyes.  Follow up with your doctor as told.  GET HELP IF: Your symptoms do not improve after 1 week of treatment. Symptoms include:  Cough.  Fever.  Coughing up thick spit.  Body aches.  Chest congestion.  Chills.  Shortness of breath.  Sore throat.  GET HELP RIGHT AWAY IF:   You have an increased fever.  You have chills.  You have severe  shortness of breath.  You have bloody thick spit (sputum).  You throw up (vomit) often.  You lose too much body fluid (dehydration).  You have a severe headache.  You faint.  MAKE SURE YOU:   Understand these instructions.  Will watch your condition.  Will get help right away if you are not doing well or get worse. Document Released: 10/01/2007 Document Revised: 12/15/2012 Document Reviewed: 10/05/2012 [facility] Patient Information 2015 [place], [place]. This information is not intended to replace advice given to you by your health care provider. Make sure you discuss any questions you have with your health care provider.

## 2016-05-08 DIAGNOSIS — H0012 Chalazion right lower eyelid: Secondary | ICD-10-CM | POA: Diagnosis not present

## 2016-05-23 DIAGNOSIS — H0012 Chalazion right lower eyelid: Secondary | ICD-10-CM | POA: Diagnosis not present

## 2016-05-27 ENCOUNTER — Encounter: Payer: Self-pay | Admitting: General Practice

## 2016-06-16 ENCOUNTER — Other Ambulatory Visit: Payer: Self-pay | Admitting: Family Medicine

## 2016-06-27 DIAGNOSIS — H35361 Drusen (degenerative) of macula, right eye: Secondary | ICD-10-CM | POA: Diagnosis not present

## 2016-06-27 DIAGNOSIS — H35412 Lattice degeneration of retina, left eye: Secondary | ICD-10-CM | POA: Diagnosis not present

## 2016-06-27 DIAGNOSIS — H40013 Open angle with borderline findings, low risk, bilateral: Secondary | ICD-10-CM | POA: Diagnosis not present

## 2016-06-27 DIAGNOSIS — Z961 Presence of intraocular lens: Secondary | ICD-10-CM | POA: Diagnosis not present

## 2016-07-14 ENCOUNTER — Other Ambulatory Visit: Payer: Self-pay | Admitting: Family Medicine

## 2016-08-13 ENCOUNTER — Encounter: Payer: Self-pay | Admitting: Family Medicine

## 2016-08-13 ENCOUNTER — Ambulatory Visit (INDEPENDENT_AMBULATORY_CARE_PROVIDER_SITE_OTHER): Payer: Medicare Other | Admitting: Family Medicine

## 2016-08-13 VITALS — BP 124/81 | HR 67 | Temp 98.1°F | Resp 16 | Ht 68.0 in | Wt 275.4 lb

## 2016-08-13 DIAGNOSIS — Z23 Encounter for immunization: Secondary | ICD-10-CM

## 2016-08-13 DIAGNOSIS — E785 Hyperlipidemia, unspecified: Secondary | ICD-10-CM

## 2016-08-13 DIAGNOSIS — M25561 Pain in right knee: Secondary | ICD-10-CM

## 2016-08-13 DIAGNOSIS — I10 Essential (primary) hypertension: Secondary | ICD-10-CM

## 2016-08-13 LAB — LIPID PANEL
CHOLESTEROL: 149 mg/dL (ref 0–200)
HDL: 53.8 mg/dL (ref >39.00)
LDL Cholesterol: 80 mg/dL (ref 0–99)
NonHDL: 94.74
Total CHOL/HDL Ratio: 3
Triglycerides: 74 mg/dL (ref 0.0–149.0)
VLDL: 14.8 mg/dL (ref 0.0–40.0)

## 2016-08-13 LAB — CBC WITH DIFFERENTIAL/PLATELET
BASOS ABS: 0 10*3/uL (ref 0.0–0.1)
Basophils Relative: 0.9 % (ref 0.0–3.0)
Eosinophils Absolute: 0.1 10*3/uL (ref 0.0–0.7)
Eosinophils Relative: 1.8 % (ref 0.0–5.0)
HCT: 41.8 % (ref 36.0–46.0)
Hemoglobin: 14.3 g/dL (ref 12.0–15.0)
LYMPHS ABS: 1.1 10*3/uL (ref 0.7–4.0)
LYMPHS PCT: 27 % (ref 12.0–46.0)
MCHC: 34.2 g/dL (ref 30.0–36.0)
MCV: 97.1 fl (ref 78.0–100.0)
MONOS PCT: 8.1 % (ref 3.0–12.0)
Monocytes Absolute: 0.3 10*3/uL (ref 0.1–1.0)
NEUTROS PCT: 62.2 % (ref 43.0–77.0)
Neutro Abs: 2.4 10*3/uL (ref 1.4–7.7)
Platelets: 276 10*3/uL (ref 150.0–400.0)
RBC: 4.31 Mil/uL (ref 3.87–5.11)
RDW: 13.8 % (ref 11.5–15.5)
WBC: 3.9 10*3/uL — ABNORMAL LOW (ref 4.0–10.5)

## 2016-08-13 LAB — HEPATIC FUNCTION PANEL
ALBUMIN: 4.3 g/dL (ref 3.5–5.2)
ALT: 33 U/L (ref 0–35)
AST: 28 U/L (ref 0–37)
Alkaline Phosphatase: 78 U/L (ref 39–117)
Bilirubin, Direct: 0.2 mg/dL (ref 0.0–0.3)
TOTAL PROTEIN: 7 g/dL (ref 6.0–8.3)
Total Bilirubin: 1.2 mg/dL (ref 0.2–1.2)

## 2016-08-13 LAB — BASIC METABOLIC PANEL
BUN: 18 mg/dL (ref 6–23)
CALCIUM: 9.9 mg/dL (ref 8.4–10.5)
CO2: 28 meq/L (ref 19–32)
CREATININE: 0.73 mg/dL (ref 0.40–1.20)
Chloride: 103 mEq/L (ref 96–112)
GFR: 83.28 mL/min (ref >60.00)
Glucose, Bld: 103 mg/dL — ABNORMAL HIGH (ref 70–99)
Potassium: 4.1 mEq/L (ref 3.5–5.1)
Sodium: 138 mEq/L (ref 135–145)

## 2016-08-13 NOTE — Patient Instructions (Addendum)
Schedule your complete physical in 6 months and your Wellness Visit with Maudie Mercury around the same time [facility] notify you of your lab results and make any changes if needed Continue to work on healthy diet and regular exercise- you can do it! Call with any questions or concerns [place]!!!

## 2016-08-13 NOTE — Assessment & Plan Note (Signed)
Chronic problem.  Well controlled today.  Asymptomatic.  Check labs.  No anticipated med changes.  Will follow. 

## 2016-08-13 NOTE — Progress Notes (Signed)
   Subjective:    Patient ID: Shelby Reeves, female    DOB: April 09, 1946, 71 y.o.   MRN: [id number]  HPI HTN- chronic problem, on Losartan HCTZ daily w/ adequate control.  Denies CP, SOB, HAs, visual changes, edema.  Hyperlipidemia- chronic problem, on Pravastatin daily.  Denies abd pain, N/V, myalgias.  Obesity- ongoing issue.  Pt has gained 8 lbs since last visit.  Not getting any regular exercise.   Review of Systems For ROS see HPI     Objective:   Physical Exam  Constitutional: She is oriented to person, place, and time. She appears well-developed and well-nourished. No distress.  obese  HENT:  Head: Normocephalic and atraumatic.  Eyes: Conjunctivae and EOM are normal. Pupils are equal, round, and reactive to light.  Neck: Normal range of motion. Neck supple. Thyromegaly (R sided nodule) present.  Cardiovascular: Normal rate, regular rhythm, normal heart sounds and intact distal pulses.   No murmur heard. Pulmonary/Chest: Effort normal and breath sounds normal. No respiratory distress.  Abdominal: Soft. She exhibits no distension. There is no tenderness.  Musculoskeletal: She exhibits no edema.  Lymphadenopathy:    She has no cervical adenopathy.  Neurological: She is alert and oriented to person, place, and time.  Skin: Skin is warm and dry.  Psychiatric: She has a normal mood and affect. Her behavior is normal.  Vitals reviewed.         Assessment & Plan:

## 2016-08-13 NOTE — Addendum Note (Signed)
Addended by: Davis Gourd on: 08/13/2016 11:28 AM   Modules accepted: Orders

## 2016-08-13 NOTE — Assessment & Plan Note (Signed)
Deteriorated.  Pt has gained 8 lbs.  Stressed need for healthy diet and regular exercise.  Will continue to follow.

## 2016-08-13 NOTE — Assessment & Plan Note (Signed)
Chronic problem.  Tolerating statin w/o difficulty.  Stressed need for healthy diet and regular exercise.  Check labs.  Adjust meds prn  

## 2016-08-13 NOTE — Progress Notes (Signed)
Pre visit review using our clinic review tool, if applicable. No additional management support is needed unless otherwise documented below in the visit note. 

## 2016-08-14 ENCOUNTER — Encounter: Payer: Self-pay | Admitting: General Practice

## 2016-09-02 ENCOUNTER — Other Ambulatory Visit: Payer: Self-pay | Admitting: Otolaryngology

## 2016-09-02 DIAGNOSIS — E079 Disorder of thyroid, unspecified: Secondary | ICD-10-CM

## 2016-09-04 DIAGNOSIS — M25561 Pain in right knee: Secondary | ICD-10-CM | POA: Diagnosis not present

## 2016-10-03 ENCOUNTER — Ambulatory Visit (INDEPENDENT_AMBULATORY_CARE_PROVIDER_SITE_OTHER): Payer: Medicare Other | Admitting: Physician Assistant

## 2016-10-03 ENCOUNTER — Encounter: Payer: Self-pay | Admitting: Physician Assistant

## 2016-10-03 VITALS — BP 130/74 | HR 67 | Temp 98.9°F | Ht 68.0 in | Wt 280.0 lb

## 2016-10-03 DIAGNOSIS — J069 Acute upper respiratory infection, unspecified: Secondary | ICD-10-CM | POA: Diagnosis not present

## 2016-10-03 MED ORDER — DOXYCYCLINE HYCLATE 100 MG PO TABS: 100.0000 mg | ORAL_TABLET | Freq: Two times a day (BID) | ORAL | 0 refills | Status: DC

## 2016-10-03 MED ORDER — BENZONATATE 100 MG PO CAPS: 100.0000 mg | ORAL_CAPSULE | Freq: Two times a day (BID) | ORAL | 1 refills | Status: DC | PRN

## 2016-10-03 NOTE — Patient Instructions (Signed)
It was great meeting you!  Please take the antibiotic as prescribed.  Use cough capsules as prescribed.

## 2016-10-03 NOTE — Progress Notes (Signed)
Shelby Reeves is a 71 y.o. female here for Cough and Chest congestion.  I acted as Education administrator for [employer], PA-C Anselmo Pickler, LPN  History of Present Illness:   Chief Complaint  Patient presents with  . Cough  . Chest congestion    Cough  This is a new problem. Episode onset: x 3-4 days. The problem has been gradually worsening. The problem occurs every few hours. The cough is non-productive. Associated symptoms include headaches, nasal congestion and postnasal drip. Pertinent negatives include no chest pain, chills, ear congestion, ear pain, fever, heartburn, shortness of breath or wheezing. Associated symptoms comments: Slight sore throat last night, headache thinks from sinus pressure off and on. Chest congestion.. The symptoms are aggravated by lying down. The treatment provided moderate relief. Her past medical history is significant for bronchitis.   Non-smoker. No sick contacts.  No history of PNA or asthma. No changes in appetite. Feels well hydrated. She is going to her granddaughter's graduation this weekend and is concerned that she is going to not feel improved in time and is going to get worse over the weekend.   PMHx, SurgHx, SocialHx, Medications, and Allergies were reviewed in the Visit Navigator and updated as appropriate.  Current Medications:   Current Outpatient Prescriptions:  .  aspirin 81 MG tablet, Take 81 mg by mouth daily.  , Disp: , Rfl:  .  hydrochlorothiazide (HYDRODIURIL) 12.5 MG tablet, take 1 tablet by mouth once daily, Disp: 90 tablet, Rfl: 1 .  losartan-hydrochlorothiazide (HYZAAR) 50-12.5 MG tablet, take 1 tablet by mouth once daily, Disp: 90 tablet, Rfl: 1 .  Multiple Vitamin (MULTIVITAMIN) tablet, Take 1 tablet by mouth daily.  , Disp: , Rfl:  .  pantoprazole (PROTONIX) 40 MG tablet, Take 40 mg by mouth daily as needed. Reported on 10/03/2015, Disp: , Rfl:  .  pravastatin (PRAVACHOL) 40 MG tablet, take 1 tablet by mouth once daily, Disp: 90  tablet, Rfl: 1 .  albuterol (PROVENTIL HFA;VENTOLIN HFA) 108 (90 Base) MCG/ACT inhaler, Inhale 2 puffs into the lungs every 6 (six) hours as needed for wheezing or shortness of breath. (Patient not taking: Reported on 10/03/2016), Disp: 1 Inhaler, Rfl: 0 .  benzonatate (TESSALON) 100 MG capsule, Take 1 capsule (100 mg total) by mouth 2 (two) times daily as needed for cough., Disp: 20 capsule, Rfl: 1 .  doxycycline (VIBRA-TABS) 100 MG tablet, Take 1 tablet (100 mg total) by mouth 2 (two) times daily., Disp: 20 tablet, Rfl: 0   Review of Systems:   Review of Systems  Constitutional: Negative for chills and fever.  HENT: Positive for postnasal drip. Negative for ear pain.   Respiratory: Positive for cough. Negative for shortness of breath and wheezing.   Cardiovascular: Negative for chest pain.  Gastrointestinal: Negative for heartburn.  Neurological: Positive for headaches.    Vitals:   Vitals:   10/03/16 1359  BP: 130/74  Pulse: 67  Temp: 98.9 F (37.2 C)  TempSrc: Oral  SpO2: 95%  Weight: 280 lb (127 kg)  Height: 5\' 8"  (1.727 m)     Body mass index is 42.57 kg/m.  Physical Exam:   Physical Exam  Constitutional: She appears well-developed. She is cooperative.  Non-toxic appearance. She does not have a sickly appearance. She does not appear ill. No distress.  HENT:  Head: Normocephalic and atraumatic.  Right Ear: Tympanic membrane, external ear and ear canal normal. Tympanic membrane is not erythematous, not retracted and not bulging.  Left Ear: Tympanic  membrane, external ear and ear canal normal. Tympanic membrane is not erythematous, not retracted and not bulging.  Nose: Right sinus exhibits maxillary sinus tenderness. Right sinus exhibits no frontal sinus tenderness. Left sinus exhibits maxillary sinus tenderness. Left sinus exhibits no frontal sinus tenderness.  Mouth/Throat: Uvula is midline. Posterior oropharyngeal erythema present. No posterior oropharyngeal edema.   Eyes: Conjunctivae and lids are normal.  Neck: Trachea normal.  Cardiovascular: Normal rate, regular rhythm, S1 normal, S2 normal and normal heart sounds.   Pulmonary/Chest: Effort normal and breath sounds normal. She has no decreased breath sounds. She has no wheezes. She has no rhonchi. She has no rales.  Productive cough throughout exam  Lymphadenopathy:    She has no cervical adenopathy.  Neurological: She is alert.  Skin: Skin is warm, dry and intact.  Psychiatric: She has a normal mood and affect. Her speech is normal and behavior is normal.  Nursing note and vitals reviewed.     Assessment and Plan:    [name] was seen today for cough and chest congestion.  Diagnoses and all orders for this visit:  Upper respiratory tract infection, unspecified type  Other orders -     doxycycline (VIBRA-TABS) 100 MG tablet; Take 1 tablet (100 mg total) by mouth 2 (two) times daily. -     benzonatate (TESSALON) 100 MG capsule; Take 1 capsule (100 mg total) by mouth 2 (two) times daily as needed for cough.   Patient with upper respiratory tract infection with sinus tenderness. I've given her a prescription of doxycycline as well as Tessalon Perles. Tessalon Perles have worked very well for patient and the past. I advised patient to follow-up with PCP if her symptoms worsen, especially if she experiences worsening shortness of breath, fever, additional worrisome symptoms. Patient is agreeable to plan.  . Reviewed expectations re: course of current medical issues. . Discussed self-management of symptoms. . Outlined signs and symptoms indicating need for more acute intervention. . Patient verbalized understanding and all questions were answered. . See orders for this visit as documented in the electronic medical record. . Patient received an After-Visit Summary.  CMA or LPN served as scribe during this visit. History, Physical, and Plan performed by medical provider. Documentation and orders  reviewed and attested to.  Inda Coke, PA-C

## 2016-11-01 ENCOUNTER — Other Ambulatory Visit: Payer: Self-pay | Admitting: Family Medicine

## 2016-12-16 ENCOUNTER — Other Ambulatory Visit: Payer: Self-pay | Admitting: Family Medicine

## 2017-01-17 ENCOUNTER — Other Ambulatory Visit: Payer: Self-pay | Admitting: Family Medicine

## 2017-02-17 NOTE — Progress Notes (Signed)
Subjective:   Shelby Reeves is a 71 y.o. female who presents for Medicare Annual (Subsequent) preventive examination.  Review of Systems:  No ROS.  Medicare Wellness Visit. Additional risk factors are reflected in the social history.  Cardiac Risk Factors include: advanced age (>23men, >68 women);hypertension;dyslipidemia;obesity (BMI >30kg/m2);family history of premature cardiovascular disease Sleep patterns: Sleeps around 6 hours.  Home Safety/Smoke Alarms: Feels safe in home. Smoke alarms in place.  Living environment; residence and Firearm Safety: Lives alone in 1 story home.  Seat Belt Safety/Bike Helmet: Wears seat belt.   Female:   Pap-03/17/2016       Mammo-03/31/2016,  Negative. Prefers screening every 2 years.  Dexa scan-03/13/2015, normal.        CCS-Colonoscopy 03/30/2012, normal. Recall 10 years.       Objective:     Vitals: BP 132/76 (BP Location: Left Arm, Patient Position: Sitting, Cuff Size: Large)   Pulse 60   Temp 98.5 F (36.9 C) (Temporal)   Resp 18   Ht 5\' 8"  (1.727 m)   Wt 273 lb 6.4 oz (124 kg)   SpO2 98%   BMI 41.57 kg/m   Body mass index is 41.57 kg/m.   Tobacco History  Smoking Status  . Never Smoker  Smokeless Tobacco  . Never Used     Counseling given: Not Answered   Past Medical History:  Diagnosis Date  . Allergy   . Carpal tunnel syndrome   . Fibroid   . History of colon polyps 2002  . Hyperlipidemia   . Hypertension   . Vertigo    Past Surgical History:  Procedure Laterality Date  . CARPAL TUNNEL RELEASE     bilateral  . CATARACT EXTRACTION W/ INTRAOCULAR LENS  IMPLANT, BILATERAL Bilateral 2018   July and August  . COLONOSCOPY     hx tubular adenomas 2003  . ENDOMETRIAL BIOPSY  09-12-09   --benign/Dr. Romine  . HYSTEROSCOPY  10-22-09   resection of polyps and D & C-Dr. Joan Flores  . HYSTEROSCOPY  06-17-91   and D & C  . KNEE ARTHROSCOPY  2010   right  . UTERINE FIBROID SURGERY     Family History  Problem  Relation Age of Onset  . Heart disease Mother   . Hypertension Mother   . Diabetes Mother   . Cancer Father        colon cancer?  . Stroke Sister   . Lung cancer Sister   . Colon cancer Neg Hx   . Stomach cancer Neg Hx    History  Sexual Activity  . Sexual activity: Yes  . Partners: Male    Outpatient Encounter Prescriptions as of 02/18/2017  Medication Sig  . aspirin 81 MG tablet Take 81 mg by mouth daily.    . hydrochlorothiazide (HYDRODIURIL) 12.5 MG tablet take 1 tablet by mouth once daily  . losartan-hydrochlorothiazide (HYZAAR) 50-12.5 MG tablet take 1 tablet by mouth once daily  . Multiple Vitamin (MULTIVITAMIN) tablet Take 1 tablet by mouth daily.    . pantoprazole (PROTONIX) 40 MG tablet Take 40 mg by mouth daily as needed. Reported on 10/03/2015  . pravastatin (PRAVACHOL) 40 MG tablet take 1 tablet by mouth once daily  . albuterol (PROVENTIL HFA;VENTOLIN HFA) 108 (90 Base) MCG/ACT inhaler Inhale 2 puffs into the lungs every 6 (six) hours as needed for wheezing or shortness of breath. (Patient not taking: Reported on 10/03/2016)  . benzonatate (TESSALON) 100 MG capsule Take 1 capsule (100 mg total)  by mouth 2 (two) times daily as needed for cough. (Patient not taking: Reported on 02/18/2017)  . Zoster Vaccine Adjuvanted [facility]) injection Inject 0.5 mLs into the muscle once.  . [DISCONTINUED] doxycycline (VIBRA-TABS) 100 MG tablet Take 1 tablet (100 mg total) by mouth 2 (two) times daily.   No facility-administered encounter medications on file as of 02/18/2017.     Activities of Daily Living In your present state of health, do you have any difficulty performing the following activities: 02/18/2017  Hearing? N  Vision? N  Difficulty concentrating or making decisions? N  Walking or climbing stairs? Y  Comment knee "gives way" with steps  Dressing or bathing? N  Doing errands, shopping? N  Preparing Food and eating ? N  Using the Toilet? N  In the past six months,  have you accidently leaked urine? N  Do you have problems with loss of bowel control? N  Managing your Medications? N  Managing your Finances? N  Housekeeping or managing your Housekeeping? N  Some recent data might be hidden    Patient Care Team: [name], MD as PCP - General Ladene Artist, MD as Consulting Physician (Gastroenterology) Jerrell Belfast, MD as Consulting Physician (Otolaryngology)    Assessment:    Physical assessment deferred to PCP.  Exercise Activities and Dietary recommendations Current Exercise Habits: Home exercise routine, Type of exercise: Other - see comments (stationary bike), Time (Minutes): 40, Frequency (Times/Week): 7, Weekly Exercise (Minutes/Week): 280, Exercise limited by: None identified Diet (meal preparation, eat out, water intake, caffeinated beverages, dairy products, fruits and vegetables): Drinks half/half tea and water.   Breakfast: Granola bar, coffee Lunch: sandwich, chips; salad Dinner: salad; pb crackers    Goals    . Weight (lb) < 250 lb (113.4 kg)          Lose weight by watching diet and continuing to exercise.       Fall Risk Fall Risk  02/18/2017 02/14/2016 02/09/2015 12/07/2013 09/23/2012  Falls in the past year? No No Yes No Yes  Number falls in past yr: - - 1 - 1  Injury with Fall? - - No - No  Risk for fall due to : - - Other (Comment) - -  Risk for fall due to: Comment - - tripped and hit her knee - -   Depression Screen PHQ 2/9 Scores 02/18/2017 02/14/2016 02/09/2015 12/07/2013  PHQ - 2 Score 0 0 0 0     Cognitive Function MMSE - Mini Mental State Exam 02/18/2017  Orientation to time 5  Orientation to Place 5  Registration 3  Attention/ Calculation 5  Recall 3  Language- name 2 objects 2  Language- repeat 1  Language- follow 3 step command 3  Language- read & follow direction 1  Write a sentence 1  Copy design 1  Total score 30        Immunization History  Administered Date(s)  Administered  . Influenza Split 02/12/2011, 04/29/2012  . Influenza, High Dose Seasonal PF 02/18/2017  . Influenza,inj,Quad PF,6+ Mos 01/26/2013, 01/05/2014, 02/14/2016  . Pneumococcal Conjugate-13 02/07/2014  . Pneumococcal Polysaccharide-23 03/17/2008, 08/13/2016   Screening Tests Health Maintenance  Topic Date Due  . INFLUENZA VACCINE  11/26/2016  . TETANUS/TDAP  04/28/2018 (Originally 07/27/1964)  . Hepatitis C Screening  04/28/2018 (Originally 01/27/1946)  . DEXA SCAN  03/12/2017  . COLONOSCOPY  03/30/2017  . MAMMOGRAM  03/31/2017  . PNA vac Low Risk Adult  Completed      Plan:  Shingles vaccine at pharmacy.   Schedule bone scan after 03/13/2017.   Bring a copy of your living will and/or healthcare power of attorney to your next office visit.  Continue doing brain stimulating activities (puzzles, reading, adult coloring books, staying active) to keep memory sharp.   I have personally reviewed and noted the following in the patient's chart:   . Medical and social history . Use of alcohol, tobacco or illicit drugs  . Current medications and supplements . Functional ability and status . Nutritional status . Physical activity . Advanced directives . List of other physicians . Hospitalizations, surgeries, and ER visits in previous 12 months . Vitals . Screenings to include cognitive, depression, and falls . Referrals and appointments  In addition, I have reviewed and discussed with patient certain preventive protocols, quality metrics, and best practice recommendations. A written personalized care plan for preventive services as well as general preventive health recommendations were provided to patient.     Gerilyn Nestle, RN  02/18/2017  Reviewed documentation and agree w/ above.  Annye Asa, MD

## 2017-02-18 ENCOUNTER — Encounter: Payer: Self-pay | Admitting: General Practice

## 2017-02-18 ENCOUNTER — Encounter: Payer: Self-pay | Admitting: Family Medicine

## 2017-02-18 ENCOUNTER — Ambulatory Visit (INDEPENDENT_AMBULATORY_CARE_PROVIDER_SITE_OTHER): Payer: Medicare Other | Admitting: Family Medicine

## 2017-02-18 VITALS — BP 132/76 | HR 60 | Temp 98.5°F | Resp 18 | Ht 68.0 in | Wt 273.4 lb

## 2017-02-18 DIAGNOSIS — Z23 Encounter for immunization: Secondary | ICD-10-CM | POA: Diagnosis not present

## 2017-02-18 DIAGNOSIS — I10 Essential (primary) hypertension: Secondary | ICD-10-CM

## 2017-02-18 DIAGNOSIS — Z0000 Encounter for general adult medical examination without abnormal findings: Secondary | ICD-10-CM | POA: Diagnosis not present

## 2017-02-18 DIAGNOSIS — E2839 Other primary ovarian failure: Secondary | ICD-10-CM

## 2017-02-18 LAB — CBC WITH DIFFERENTIAL/PLATELET
BASOS PCT: 0.6 % (ref 0.0–3.0)
Basophils Absolute: 0 10*3/uL (ref 0.0–0.1)
Eosinophils Absolute: 0.1 10*3/uL (ref 0.0–0.7)
Eosinophils Relative: 1.8 % (ref 0.0–5.0)
HEMATOCRIT: 42.3 % (ref 36.0–46.0)
Hemoglobin: 14.6 g/dL (ref 12.0–15.0)
LYMPHS PCT: 25.7 % (ref 12.0–46.0)
Lymphs Abs: 1 10*3/uL (ref 0.7–4.0)
MCHC: 34.5 g/dL (ref 30.0–36.0)
MCV: 97.8 fl (ref 78.0–100.0)
MONOS PCT: 9.7 % (ref 3.0–12.0)
Monocytes Absolute: 0.4 10*3/uL (ref 0.1–1.0)
Neutro Abs: 2.4 10*3/uL (ref 1.4–7.7)
Neutrophils Relative %: 62.2 % (ref 43.0–77.0)
PLATELETS: 256 10*3/uL (ref 150.0–400.0)
RBC: 4.33 Mil/uL (ref 3.87–5.11)
RDW: 13.6 % (ref 11.5–15.5)
WBC: 3.8 10*3/uL — ABNORMAL LOW (ref 4.0–10.5)

## 2017-02-18 LAB — BASIC METABOLIC PANEL
BUN: 17 mg/dL (ref 6–23)
CALCIUM: 10.3 mg/dL (ref 8.4–10.5)
CO2: 30 meq/L (ref 19–32)
Chloride: 102 mEq/L (ref 96–112)
Creatinine, Ser: 0.73 mg/dL (ref 0.40–1.20)
GFR: 83.16 mL/min (ref >60.00)
Glucose, Bld: 106 mg/dL — ABNORMAL HIGH (ref 70–99)
Potassium: 4.1 mEq/L (ref 3.5–5.1)
SODIUM: 139 meq/L (ref 135–145)

## 2017-02-18 LAB — HEPATIC FUNCTION PANEL
ALK PHOS: 74 U/L (ref 39–117)
ALT: 33 U/L (ref 0–35)
AST: 25 U/L (ref 0–37)
Albumin: 4.3 g/dL (ref 3.5–5.2)
BILIRUBIN DIRECT: 0.3 mg/dL (ref 0.0–0.3)
BILIRUBIN TOTAL: 1.5 mg/dL — AB (ref 0.2–1.2)
Total Protein: 6.9 g/dL (ref 6.0–8.3)

## 2017-02-18 LAB — LIPID PANEL
CHOL/HDL RATIO: 3
Cholesterol: 136 mg/dL (ref 0–200)
HDL: 51.9 mg/dL (ref >39.00)
LDL CALC: 66 mg/dL (ref 0–99)
NONHDL: 84.46
Triglycerides: 92 mg/dL (ref 0.0–149.0)
VLDL: 18.4 mg/dL (ref 0.0–40.0)

## 2017-02-18 LAB — TSH: TSH: 3.08 u[IU]/mL (ref 0.35–4.50)

## 2017-02-18 MED ORDER — PANTOPRAZOLE SODIUM 40 MG PO TBEC: 40.0000 mg | DELAYED_RELEASE_TABLET | Freq: Every day | ORAL | 1 refills | Status: DC | PRN

## 2017-02-18 MED ORDER — ZOSTER VAC RECOMB ADJUVANTED 50 MCG/0.5ML IM SUSR: 0.5000 mL | Freq: Once | INTRAMUSCULAR | 1 refills | Status: AC

## 2017-02-18 NOTE — Assessment & Plan Note (Signed)
Pt's PE WNL w/ exception of obesity and known thyroid nodule.  UTD on mammo, colonoscopy.  DEXA ordered.  UTD on pneumonia vaccines.  Flu shot given today.  Check labs.  Anticipatory guidance provided.

## 2017-02-18 NOTE — Assessment & Plan Note (Signed)
Ongoing issue for pt.  She has lost 7 lbs since last visit- applauded her efforts.  Check labs to risk stratify.  Will follow closely.

## 2017-02-18 NOTE — Assessment & Plan Note (Signed)
Chronic problem.  Asymptomatic.  Check labs.  No anticipated med changes.

## 2017-02-18 NOTE — Progress Notes (Signed)
   Subjective:    Patient ID: Shelby Reeves, female    DOB: 1946/03/10, 71 y.o.   MRN: [id number]  HPI CPE- UTD on mammo (would like to defer this to every 2 yrs).  Due for DEXA (ordered).  Due for colonoscopy later this year (unless GI changes recommendations) Got flu shot today.  UTD on pneumonia vaccines.  Pt has lost 7 lbs since last visit.   Review of Systems Patient reports no vision/ hearing changes, adenopathy,fever, weight change,  persistant/recurrent hoarseness , swallowing issues, chest pain, palpitations, edema, persistant/recurrent cough, hemoptysis, dyspnea (rest/exertional/paroxysmal nocturnal), gastrointestinal bleeding (melena, rectal bleeding), abdominal pain, significant heartburn, bowel changes, GU symptoms (dysuria, hematuria, incontinence), Gyn symptoms (abnormal  bleeding, pain),  syncope, focal weakness, memory loss, numbness & tingling, skin/hair/nail changes, abnormal bruising or bleeding, anxiety, or depression.     Objective:   Physical Exam General Appearance:    Alert, cooperative, no distress, appears stated age, obese  Head:    Normocephalic, without obvious abnormality, atraumatic  Eyes:    PERRL, conjunctiva/corneas clear, EOM's intact, fundi    benign, both eyes  Ears:    Normal TM's and external ear canals, both ears  Nose:   Nares normal, septum midline, mucosa normal, no drainage    or sinus tenderness  Throat:   Lips, mucosa, and tongue normal; teeth and gums normal  Neck:   Supple, symmetrical, trachea midline, no adenopathy;    Thyroid: R sided thyroid nodule  Back:     Symmetric, no curvature, ROM normal, no CVA tenderness  Lungs:     Clear to auscultation bilaterally, respirations unlabored  Chest Wall:    No tenderness or deformity   Heart:    Regular rate and rhythm, S1 and S2 normal, no murmur, rub   or gallop  Breast Exam:    Deferred to GYN  Abdomen:     Soft, non-tender, bowel sounds active all four quadrants,    no masses, no  organomegaly  Genitalia:    Deferred to GYN  Rectal:    Extremities:   Extremities normal, atraumatic, no cyanosis or edema  Pulses:   2+ and symmetric all extremities  Skin:   Skin color, texture, turgor normal, no rashes or lesions  Lymph nodes:   Cervical, supraclavicular, and axillary nodes normal  Neurologic:   CNII-XII intact, normal strength, sensation and reflexes    throughout          Assessment & Plan:

## 2017-02-18 NOTE — Patient Instructions (Addendum)
Follow up in 6 months to recheck BP and cholesterol We'll notify you of your lab results and make any changes if needed Continue to work on healthy diet and regular exercise- I'm proud of you!!! Schedule your colonoscopy when you get the recall letter from GI Call with any questions or concerns Happy Fall!!!  Shingles vaccine at pharmacy.   Schedule bone scan after 03/13/2017.   Bring a copy of your living will and/or healthcare power of attorney to your next office visit.  Continue doing brain stimulating activities (puzzles, reading, adult coloring books, staying active) to keep memory sharp.   Health Maintenance, Female Adopting a healthy lifestyle and getting preventive care can go a long way to promote health and wellness. Talk with your health care provider about what schedule of regular examinations is right for you. This is a good chance for you to check in with your provider about disease prevention and staying healthy. In between checkups, there are plenty of things you can do on your own. Experts have done a lot of research about which lifestyle changes and preventive measures are most likely to keep you healthy. Ask your health care provider for more information. Weight and diet Eat a healthy diet  Be sure to include plenty of vegetables, fruits, low-fat dairy products, and lean protein.  Do not eat a lot of foods high in solid fats, added sugars, or salt.  Get regular exercise. This is one of the most important things you can do for your health. ? Most adults should exercise for at least 150 minutes each week. The exercise should increase your heart rate and make you sweat (moderate-intensity exercise). ? Most adults should also do strengthening exercises at least twice a week. This is in addition to the moderate-intensity exercise.  Maintain a healthy weight  Body mass index (BMI) is a measurement that can be used to identify possible weight problems. It estimates body  fat based on height and weight. Your health care provider can help determine your BMI and help you achieve or maintain a healthy weight.  For females 67 years of age and older: ? A BMI below 18.5 is considered underweight. ? A BMI of 18.5 to 24.9 is normal. ? A BMI of 25 to 29.9 is considered overweight. ? A BMI of 30 and above is considered obese.  Watch levels of cholesterol and blood lipids  You should start having your blood tested for lipids and cholesterol at 72 years of age, then have this test every 5 years.  You may need to have your cholesterol levels checked more often if: ? Your lipid or cholesterol levels are high. ? You are older than 72 years of age. ? You are at high risk for heart disease.  Cancer screening Lung Cancer  Lung cancer screening is recommended for adults 59-55 years old who are at high risk for lung cancer because of a history of smoking.  A yearly low-dose CT scan of the lungs is recommended for people who: ? Currently smoke. ? Have quit within the past 15 years. ? Have at least a 30-pack-year history of smoking. A pack year is smoking an average of one pack of cigarettes a day for 1 year.  Yearly screening should continue until it has been 15 years since you quit.  Yearly screening should stop if you develop a health problem that would prevent you from having lung cancer treatment.  Breast Cancer  Practice breast self-awareness. This means understanding how your  breasts normally appear and feel.  It also means doing regular breast self-exams. Let your health care provider know about any changes, no matter how small.  If you are in your 20s or 30s, you should have a clinical breast exam (CBE) by a health care provider every 1-3 years as part of a regular health exam.  If you are 5 or older, have a CBE every year. Also consider having a breast X-ray (mammogram) every year.  If you have a family history of breast cancer, talk to your health care  provider about genetic screening.  If you are at high risk for breast cancer, talk to your health care provider about having an MRI and a mammogram every year.  Breast cancer gene (BRCA) assessment is recommended for women who have family members with BRCA-related cancers. BRCA-related cancers include: ? Breast. ? Ovarian. ? Tubal. ? Peritoneal cancers.  Results of the assessment will determine the need for genetic counseling and BRCA1 and BRCA2 testing.  Cervical Cancer Your health care provider may recommend that you be screened regularly for cancer of the pelvic organs (ovaries, uterus, and vagina). This screening involves a pelvic examination, including checking for microscopic changes to the surface of your cervix (Pap test). You may be encouraged to have this screening done every 3 years, beginning at age 70.  For women ages 19-65, health care providers may recommend pelvic exams and Pap testing every 3 years, or they may recommend the Pap and pelvic exam, combined with testing for human papilloma virus (HPV), every 5 years. Some types of HPV increase your risk of cervical cancer. Testing for HPV may also be done on women of any age with unclear Pap test results.  Other health care providers may not recommend any screening for nonpregnant women who are considered low risk for pelvic cancer and who do not have symptoms. Ask your health care provider if a screening pelvic exam is right for you.  If you have had past treatment for cervical cancer or a condition that could lead to cancer, you need Pap tests and screening for cancer for at least 20 years after your treatment. If Pap tests have been discontinued, your risk factors (such as having a new sexual partner) need to be reassessed to determine if screening should resume. Some women have medical problems that increase the chance of getting cervical cancer. In these cases, your health care provider may recommend more frequent screening and  Pap tests.  Colorectal Cancer  This type of cancer can be detected and often prevented.  Routine colorectal cancer screening usually begins at 72 years of age and continues through 72 years of age.  Your health care provider may recommend screening at an earlier age if you have risk factors for colon cancer.  Your health care provider may also recommend using home test kits to check for hidden blood in the stool.  A small camera at the end of a tube can be used to examine your colon directly (sigmoidoscopy or colonoscopy). This is done to check for the earliest forms of colorectal cancer.  Routine screening usually begins at age 39.  Direct examination of the colon should be repeated every 5-10 years through 72 years of age. However, you may need to be screened more often if early forms of precancerous polyps or small growths are found.  Skin Cancer  Check your skin from head to toe regularly.  Tell your health care provider about any new moles or changes in  moles, especially if there is a change in a mole's shape or color.  Also tell your health care provider if you have a mole that is larger than the size of a pencil eraser.  Always use sunscreen. Apply sunscreen liberally and repeatedly throughout the day.  Protect yourself by wearing long sleeves, pants, a wide-brimmed hat, and sunglasses whenever you are outside.  Heart disease, diabetes, and high blood pressure  High blood pressure causes heart disease and increases the risk of stroke. High blood pressure is more likely to develop in: ? People who have blood pressure in the high end of the normal range (130-139/85-89 mm Hg). ? People who are overweight or obese. ? People who are African American.  If you are 45-16 years of age, have your blood pressure checked every 3-5 years. If you are 4 years of age or older, have your blood pressure checked every year. You should have your blood pressure measured twice-once when you  are at a hospital or clinic, and once when you are not at a hospital or clinic. Record the average of the two measurements. To check your blood pressure when you are not at a hospital or clinic, you can use: ? An automated blood pressure machine at a pharmacy. ? A home blood pressure monitor.  If you are between 41 years and 69 years old, ask your health care provider if you should take aspirin to prevent strokes.  Have regular diabetes screenings. This involves taking a blood sample to check your fasting blood sugar level. ? If you are at a normal weight and have a low risk for diabetes, have this test once every three years after 72 years of age. ? If you are overweight and have a high risk for diabetes, consider being tested at a younger age or more often. Preventing infection Hepatitis B  If you have a higher risk for hepatitis B, you should be screened for this virus. You are considered at high risk for hepatitis B if: ? You were born in a country where hepatitis B is common. Ask your health care provider which countries are considered high risk. ? Your parents were born in a high-risk country, and you have not been immunized against hepatitis B (hepatitis B vaccine). ? You have HIV or AIDS. ? You use needles to inject street drugs. ? You live with someone who has hepatitis B. ? You have had sex with someone who has hepatitis B. ? You get hemodialysis treatment. ? You take certain medicines for conditions, including cancer, organ transplantation, and autoimmune conditions.  Hepatitis C  Blood testing is recommended for: ? Everyone born from [birth year] through 1966. ? Anyone with known risk factors for hepatitis C.  Sexually transmitted infections (STIs)  You should be screened for sexually transmitted infections (STIs) including gonorrhea and chlamydia if: ? You are sexually active and are younger than 72 years of age. ? You are older than 72 years of age and your health care provider  tells you that you are at risk for this type of infection. ? Your sexual activity has changed since you were last screened and you are at an increased risk for chlamydia or gonorrhea. Ask your health care provider if you are at risk.  If you do not have HIV, but are at risk, it may be recommended that you take a prescription medicine daily to prevent HIV infection. This is called pre-exposure prophylaxis (PrEP). You are considered at risk if: ? You are  sexually active and do not regularly use condoms or know the HIV status of your partner(s). ? You take drugs by injection. ? You are sexually active with a partner who has HIV.  Talk with your health care provider about whether you are at high risk of being infected with HIV. If you choose to begin PrEP, you should first be tested for HIV. You should then be tested every 3 months for as long as you are taking PrEP. Pregnancy  If you are premenopausal and you may become pregnant, ask your health care provider about preconception counseling.  If you may become pregnant, take 400 to 800 micrograms (mcg) of folic acid every day.  If you want to prevent pregnancy, talk to your health care provider about birth control (contraception). Osteoporosis and menopause  Osteoporosis is a disease in which the bones lose minerals and strength with aging. This can result in serious bone fractures. Your risk for osteoporosis can be identified using a bone density scan.  If you are 109 years of age or older, or if you are at risk for osteoporosis and fractures, ask your health care provider if you should be screened.  Ask your health care provider whether you should take a calcium or vitamin D supplement to lower your risk for osteoporosis.  Menopause may have certain physical symptoms and risks.  Hormone replacement therapy may reduce some of these symptoms and risks. Talk to your health care provider about whether hormone replacement therapy is right for  you. Follow these instructions at home:  Schedule regular health, dental, and eye exams.  Stay current with your immunizations.  Do not use any tobacco products including cigarettes, chewing tobacco, or electronic cigarettes.  If you are pregnant, do not drink alcohol.  If you are breastfeeding, limit how much and how often you drink alcohol.  Limit alcohol intake to no more than 1 drink per day for nonpregnant women. One drink equals 12 ounces of beer, 5 ounces of wine, or 1 ounces of hard liquor.  Do not use street drugs.  Do not share needles.  Ask your health care provider for help if you need support or information about quitting drugs.  Tell your health care provider if you often feel depressed.  Tell your health care provider if you have ever been abused or do not feel safe at home. This information is not intended to replace advice given to you by your health care provider. Make sure you discuss any questions you have with your health care provider. Document Released: 10/28/2010 Document Revised: 09/20/2015 Document Reviewed: 01/16/2015 Elsevier Interactive Patient Education  [employer].

## 2017-03-28 LAB — HM MAMMOGRAPHY: HM Mammogram: NORMAL (ref 0–4)

## 2017-04-29 ENCOUNTER — Other Ambulatory Visit: Payer: Self-pay | Admitting: General Practice

## 2017-04-29 ENCOUNTER — Encounter: Payer: Self-pay | Admitting: Gastroenterology

## 2017-04-29 MED ORDER — LOSARTAN POTASSIUM-HCTZ 50-12.5 MG PO TABS: 1.0000 | ORAL_TABLET | Freq: Every day | ORAL | 1 refills | Status: DC

## 2017-06-22 ENCOUNTER — Other Ambulatory Visit: Payer: Self-pay | Admitting: General Practice

## 2017-06-22 MED ORDER — HYDROCHLOROTHIAZIDE 12.5 MG PO TABS: 12.5000 mg | ORAL_TABLET | Freq: Every day | ORAL | 1 refills | Status: DC

## 2017-06-29 DIAGNOSIS — H26491 Other secondary cataract, right eye: Secondary | ICD-10-CM | POA: Diagnosis not present

## 2017-06-29 DIAGNOSIS — H40013 Open angle with borderline findings, low risk, bilateral: Secondary | ICD-10-CM | POA: Diagnosis not present

## 2017-06-29 DIAGNOSIS — Z961 Presence of intraocular lens: Secondary | ICD-10-CM | POA: Diagnosis not present

## 2017-06-29 DIAGNOSIS — H26492 Other secondary cataract, left eye: Secondary | ICD-10-CM | POA: Diagnosis not present

## 2017-07-19 ENCOUNTER — Other Ambulatory Visit: Payer: Self-pay | Admitting: Family Medicine

## 2017-08-12 ENCOUNTER — Other Ambulatory Visit: Payer: Self-pay

## 2017-08-12 ENCOUNTER — Ambulatory Visit: Payer: Medicare Other | Admitting: Family Medicine

## 2017-08-12 ENCOUNTER — Encounter: Payer: Self-pay | Admitting: Family Medicine

## 2017-08-12 VITALS — BP 122/82 | HR 58 | Temp 98.0°F | Resp 16 | Ht 68.0 in | Wt 263.2 lb

## 2017-08-12 DIAGNOSIS — I10 Essential (primary) hypertension: Secondary | ICD-10-CM

## 2017-08-12 DIAGNOSIS — E785 Hyperlipidemia, unspecified: Secondary | ICD-10-CM | POA: Diagnosis not present

## 2017-08-12 LAB — CBC WITH DIFFERENTIAL/PLATELET
Basophils Absolute: 0 10*3/uL (ref 0.0–0.1)
Basophils Relative: 0.8 % (ref 0.0–3.0)
Eosinophils Absolute: 0.1 10*3/uL (ref 0.0–0.7)
Eosinophils Relative: 1.8 % (ref 0.0–5.0)
HEMATOCRIT: 41.3 % (ref 36.0–46.0)
HEMOGLOBIN: 14.6 g/dL (ref 12.0–15.0)
LYMPHS PCT: 29.3 % (ref 12.0–46.0)
Lymphs Abs: 0.9 10*3/uL (ref 0.7–4.0)
MCHC: 35.3 g/dL (ref 30.0–36.0)
MCV: 95.1 fl (ref 78.0–100.0)
MONO ABS: 0.3 10*3/uL (ref 0.1–1.0)
MONOS PCT: 8.9 % (ref 3.0–12.0)
Neutro Abs: 1.8 10*3/uL (ref 1.4–7.7)
Neutrophils Relative %: 59.2 % (ref 43.0–77.0)
Platelets: 246 10*3/uL (ref 150.0–400.0)
RBC: 4.34 Mil/uL (ref 3.87–5.11)
RDW: 13.7 % (ref 11.5–15.5)
WBC: 3 10*3/uL — AB (ref 4.0–10.5)

## 2017-08-12 LAB — HEPATIC FUNCTION PANEL
ALBUMIN: 4.2 g/dL (ref 3.5–5.2)
ALT: 25 U/L (ref 0–35)
AST: 23 U/L (ref 0–37)
Alkaline Phosphatase: 64 U/L (ref 39–117)
Bilirubin, Direct: 0.3 mg/dL (ref 0.0–0.3)
Total Bilirubin: 1.4 mg/dL — ABNORMAL HIGH (ref 0.2–1.2)
Total Protein: 6.7 g/dL (ref 6.0–8.3)

## 2017-08-12 LAB — BASIC METABOLIC PANEL
BUN: 13 mg/dL (ref 6–23)
CALCIUM: 10 mg/dL (ref 8.4–10.5)
CO2: 26 mEq/L (ref 19–32)
CREATININE: 0.75 mg/dL (ref 0.40–1.20)
Chloride: 103 mEq/L (ref 96–112)
GFR: 80.5 mL/min (ref >60.00)
Glucose, Bld: 99 mg/dL (ref 70–99)
Potassium: 3.7 mEq/L (ref 3.5–5.1)
Sodium: 138 mEq/L (ref 135–145)

## 2017-08-12 LAB — LIPID PANEL
CHOL/HDL RATIO: 2
Cholesterol: 112 mg/dL (ref 0–200)
HDL: 45.1 mg/dL (ref >39.00)
LDL CALC: 50 mg/dL (ref 0–99)
NONHDL: 66.56
Triglycerides: 82 mg/dL (ref 0.0–149.0)
VLDL: 16.4 mg/dL (ref 0.0–40.0)

## 2017-08-12 LAB — TSH: TSH: 1.91 u[IU]/mL (ref 0.35–4.50)

## 2017-08-12 NOTE — Progress Notes (Signed)
   Subjective:    Patient ID: Shelby Reeves, female    DOB: May 18, 1945, 72 y.o.   MRN: [id number]  HPI HTN- chronic problem, on HCTZ and Losartan HCTZ daily.  No CP, SOB, HAs, visual changes, edema.  Hyperlipidemia- chronic problem, on Pravastatin 40mg  daily.  Denies abd pain, N/V.  Obesity- ongoing issue.  BMI is now 40- pt has lost 10 lbs.  Pt has started Nutrisystem.  No regular exercise   Review of Systems For ROS see HPI     Objective:   Physical Exam  Constitutional: She is oriented to person, place, and time. She appears well-developed and well-nourished. No distress.  HENT:  Head: Normocephalic and atraumatic.  Eyes: Pupils are equal, round, and reactive to light. Conjunctivae and EOM are normal.  Neck: Normal range of motion. Neck supple. Thyromegaly (R sided thyroid nodule) present.  Cardiovascular: Normal rate, regular rhythm, normal heart sounds and intact distal pulses.  No murmur heard. Pulmonary/Chest: Effort normal and breath sounds normal. No respiratory distress.  Abdominal: Soft. She exhibits no distension. There is no tenderness.  Musculoskeletal: She exhibits no edema.  Lymphadenopathy:    She has no cervical adenopathy.  Neurological: She is alert and oriented to person, place, and time.  Skin: Skin is warm and dry.  Psychiatric: She has a normal mood and affect. Her behavior is normal.  Vitals reviewed.         Assessment & Plan:

## 2017-08-12 NOTE — Assessment & Plan Note (Signed)
Applauded her recent weight loss efforts.  She is down 10 lbs since last visit.  Encouraged her to continue.  Will continue to follow.

## 2017-08-12 NOTE — Patient Instructions (Signed)
Schedule your complete physical in 6 months We'll notify you of your lab results and make any changes if needed Continue to work on healthy diet and regular exercise- you are doing great!!! Call with any questions or concerns Happy Easter!!!

## 2017-08-12 NOTE — Assessment & Plan Note (Signed)
Chronic problem.  Well controlled today.  Asymptomatic.  Check labs.  No anticipated med changes.  Will follow. 

## 2017-08-12 NOTE — Assessment & Plan Note (Signed)
Chronic problem.  Tolerating statin w/o difficulty.  Check labs.  Adjust meds prn.  Encouraged healthy diet and regular exercise.  Pt expressed understanding and is in agreement w/ plan.

## 2017-08-13 ENCOUNTER — Encounter: Payer: Self-pay | Admitting: General Practice

## 2017-10-19 ENCOUNTER — Other Ambulatory Visit: Payer: Self-pay | Admitting: Family Medicine

## 2017-10-26 ENCOUNTER — Telehealth: Payer: Self-pay | Admitting: General Practice

## 2017-10-26 DIAGNOSIS — M25512 Pain in left shoulder: Secondary | ICD-10-CM

## 2017-10-26 NOTE — Telephone Encounter (Signed)
This referral was placed today.  Pt advised.

## 2017-10-26 NOTE — Telephone Encounter (Signed)
[place] for referral to [place], dx L shoulder pain

## 2017-10-26 NOTE — Telephone Encounter (Signed)
Ok for referral?   Copied from [place] [id number]. Topic: Referral - Request >> Oct 26, 2017  3:25 PM Marin Olp L wrote: Reason for CRM: Patient would like a referral to Ortho for a stiff left shoulder. Preferred location is [facility] given they are in network. She would like a call back to let her know if and when this will be placed.

## 2017-10-30 ENCOUNTER — Other Ambulatory Visit: Payer: Self-pay | Admitting: Family Medicine

## 2017-11-11 DIAGNOSIS — M7502 Adhesive capsulitis of left shoulder: Secondary | ICD-10-CM | POA: Diagnosis not present

## 2017-11-11 DIAGNOSIS — M25512 Pain in left shoulder: Secondary | ICD-10-CM | POA: Diagnosis not present

## 2017-12-02 DIAGNOSIS — M25512 Pain in left shoulder: Secondary | ICD-10-CM | POA: Diagnosis not present

## 2017-12-02 DIAGNOSIS — M7502 Adhesive capsulitis of left shoulder: Secondary | ICD-10-CM | POA: Diagnosis not present

## 2017-12-07 DIAGNOSIS — M25519 Pain in unspecified shoulder: Secondary | ICD-10-CM | POA: Diagnosis not present

## 2017-12-09 DIAGNOSIS — M25519 Pain in unspecified shoulder: Secondary | ICD-10-CM | POA: Diagnosis not present

## 2017-12-14 DIAGNOSIS — M25512 Pain in left shoulder: Secondary | ICD-10-CM | POA: Diagnosis not present

## 2017-12-16 DIAGNOSIS — M25519 Pain in unspecified shoulder: Secondary | ICD-10-CM | POA: Diagnosis not present

## 2017-12-18 ENCOUNTER — Other Ambulatory Visit: Payer: Self-pay | Admitting: Family Medicine

## 2017-12-21 DIAGNOSIS — M25519 Pain in unspecified shoulder: Secondary | ICD-10-CM | POA: Diagnosis not present

## 2017-12-23 DIAGNOSIS — M25512 Pain in left shoulder: Secondary | ICD-10-CM | POA: Diagnosis not present

## 2017-12-30 DIAGNOSIS — M25519 Pain in unspecified shoulder: Secondary | ICD-10-CM | POA: Diagnosis not present

## 2018-01-01 DIAGNOSIS — M25519 Pain in unspecified shoulder: Secondary | ICD-10-CM | POA: Diagnosis not present

## 2018-01-04 DIAGNOSIS — M25519 Pain in unspecified shoulder: Secondary | ICD-10-CM | POA: Diagnosis not present

## 2018-01-06 DIAGNOSIS — M25519 Pain in unspecified shoulder: Secondary | ICD-10-CM | POA: Diagnosis not present

## 2018-01-17 ENCOUNTER — Other Ambulatory Visit: Payer: Self-pay | Admitting: Family Medicine

## 2018-01-26 ENCOUNTER — Ambulatory Visit: Payer: Self-pay | Admitting: Family Medicine

## 2018-01-26 NOTE — Telephone Encounter (Signed)
Incoming call from Patient with a complaint of mild dizziness . States it started over the weekend.  When she bends over or turn head aroundit occurs. Blood pressure this am was 138/68  HR 57 repeated 10 min later during call was 154/75 hr 65 Denies feeling dizzy now. Rate it mild to moderate.  Sudden moves aggrravtes it.  States this occurs years ago.  Denies headache, weakness, numbness and vomiting.  Per protocol patient scheduled for appointment for 10/2/ 19 at 1100 am with Dr. Birdie Riddle.  Reviewed care advice with patient.  Voiced understanding.    Reason for Disposition . [1] MILD dizziness (e.g., vertigo; walking normally) AND [2] has been evaluated by physician for this  Answer Assessment - Initial Assessment Questions 1. DESCRIPTION: "Describe your dizziness."     Bend over or drop my head backwards 2. VERTIGO: "Do you feel like either you or the room is spinning or tilting?"      Only for a second 3. LIGHTHEADED: "Do you feel lightheaded?" (e.g., somewhat faint, woozy, weak upon standing)     no 4. SEVERITY: "How bad is it?"  "Can you walk?"   - MILD - Feels unsteady but walking normally.   - MODERATE - Feels very unsteady when walking, but not falling; interferes with normal activities (e.g., school, work) .   - SEVERE - Unable to walk without falling (requires assistance).     Mild to moderate 5. ONSET:  "When did the dizziness begin?"     Yesterday morning 6. AGGRAVATING FACTORS: "Does anything make it worse?" (e.g., standing, change in head position)    Sudden moves 7. CAUSE: "What do you think is causing the dizziness?"     dont think it medication 8. RECURRENT SYMPTOM: "Have you had dizziness before?" If so, ask: "When was the last time?" "What happened that time?"     Years ago 9. OTHER SYMPTOMS: "Do you have any other symptoms?" (e.g., headache, weakness, numbness, vomiting, earache)     *No Answer* 10. PREGNANCY: "Is there any chance you are pregnant?" "When was your last  menstrual period?"       *No Answer*  Protocols used: DIZZINESS - VERTIGO-A-AH

## 2018-01-27 ENCOUNTER — Encounter: Payer: Self-pay | Admitting: Family Medicine

## 2018-01-27 ENCOUNTER — Other Ambulatory Visit: Payer: Self-pay

## 2018-01-27 ENCOUNTER — Ambulatory Visit: Payer: Medicare Other | Admitting: Family Medicine

## 2018-01-27 ENCOUNTER — Telehealth: Payer: Self-pay | Admitting: Family Medicine

## 2018-01-27 VITALS — BP 124/84 | HR 64 | Temp 98.2°F | Resp 16 | Ht 68.0 in | Wt 250.5 lb

## 2018-01-27 DIAGNOSIS — R42 Dizziness and giddiness: Secondary | ICD-10-CM

## 2018-01-27 DIAGNOSIS — Z23 Encounter for immunization: Secondary | ICD-10-CM

## 2018-01-27 DIAGNOSIS — R110 Nausea: Secondary | ICD-10-CM

## 2018-01-27 MED ORDER — ONDANSETRON HCL 4 MG PO TABS: 4.0000 mg | ORAL_TABLET | Freq: Three times a day (TID) | ORAL | 0 refills | Status: DC | PRN

## 2018-01-27 MED ORDER — MECLIZINE HCL 25 MG PO TABS: 25.0000 mg | ORAL_TABLET | Freq: Three times a day (TID) | ORAL | 0 refills | Status: DC | PRN

## 2018-01-27 NOTE — Telephone Encounter (Signed)
Copied from [place] [id number]. Topic: General - Other >> Jan 27, 2018  4:52 PM Keene Breath wrote: Reason for CRM: Tye [place] with [employer] called to notify Dr. Birdie Riddle that her patient's medication ondansetron (ZOFRAN) 4 MG tablet was denied because it was prescribed because of vertigo, and this drug is not approved to treat vertigo.  Please advise.  CB# [phone].

## 2018-01-27 NOTE — Progress Notes (Signed)
   Subjective:    Patient ID: Shelby Reeves, female    DOB: 01-21-1946, 72 y.o.   MRN: [id number]  HPI Vertigo- pt was traveling this weekend and when she woke Monday and turned her head, 'everything was spinning'.  Pt reports she feels ok if her head is relatively still.  sxs will occur w/ leaning forward or backwards or 'sudden moves to the side'.  Some nausea.  sxs are better than they were on Monday.  Drinking plenty of water.  Mild R occipital HA.   Review of Systems For ROS see HPI     Objective:   Physical Exam  Constitutional: She is oriented to person, place, and time. She appears well-developed and well-nourished. No distress.  HENT:  Head: Normocephalic and atraumatic.  Mouth/Throat: Uvula is midline and mucous membranes are normal.  TMs WNL No TTP over sinuses Minimal nasal congestion  Eyes: Pupils are equal, round, and reactive to light. Conjunctivae and EOM are normal.  2-3 beats of horizontal nystagmus  Neck: Normal range of motion. Neck supple.  Cardiovascular: Normal rate, regular rhythm, normal heart sounds and intact distal pulses.  Pulmonary/Chest: Effort normal and breath sounds normal. No respiratory distress. She has no wheezes. She has no rales.  Musculoskeletal: She exhibits no edema.  Lymphadenopathy:    She has no cervical adenopathy.  Neurological: She is alert and oriented to person, place, and time. She has normal reflexes. No cranial nerve deficit.  Skin: Skin is warm and dry.  Psychiatric: She has a normal mood and affect. Her behavior is normal. Judgment and thought content normal.  Vitals reviewed.         Assessment & Plan:

## 2018-01-27 NOTE — Patient Instructions (Signed)
Follow up as needed or as scheduled START the Meclizine as needed for dizziness USE the Ondansetron as needed for nausea Do the ear 'exercises' to reposition the inner ear crystals Continue to drink plenty of fluids Call with any questions or concerns Hang in there!!

## 2018-01-27 NOTE — Assessment & Plan Note (Signed)
Recurrent.  Pt's sxs and PE consistent w/ BPV.  Start Meclizine and Zofran PRN.  Pt given handout for modified Eppley maneuver.  Reviewed supportive care and red flags that should prompt return.  Pt expressed understanding and is in agreement w/ plan.

## 2018-01-27 NOTE — Telephone Encounter (Signed)
This is for the nausea associated w/ vertigo.  I wonder if we can try again

## 2018-01-28 MED ORDER — ONDANSETRON HCL 4 MG PO TABS: 4.0000 mg | ORAL_TABLET | Freq: Three times a day (TID) | ORAL | 0 refills | Status: DC | PRN

## 2018-01-28 NOTE — Telephone Encounter (Signed)
I have reordered medication with the Nausea DX

## 2018-01-30 IMAGING — US US THYROID BIOPSY
1 series · 9 of 9 positions shown · non-contrast
Comparison: Ultrasound done 02/15/2016

MEDICATIONS:
None

COMPLICATIONS:
None immediate.

INDICATION: Right thyroid nodule measuring 6.6 x 3.7 x 4.3 cm. Request for
ultrasound guided fine needle aspirate biopsy.

EXAM:
ULTRASOUND GUIDED FINE NEEDLE ASPIRATION OF INDETERMINATE THYROID
NODULE
TECHNIQUE: Informed written consent was obtained from the patient after a
discussion of the risks, benefits and alternatives to treatment.
Questions regarding the procedure were encouraged and answered. A
timeout was performed prior to the initiation of the procedure.

[Series 1: us thyroid biopsy · 0.08mm/px · 9 acquisitions, 9 frames shown]
[im 1/9]
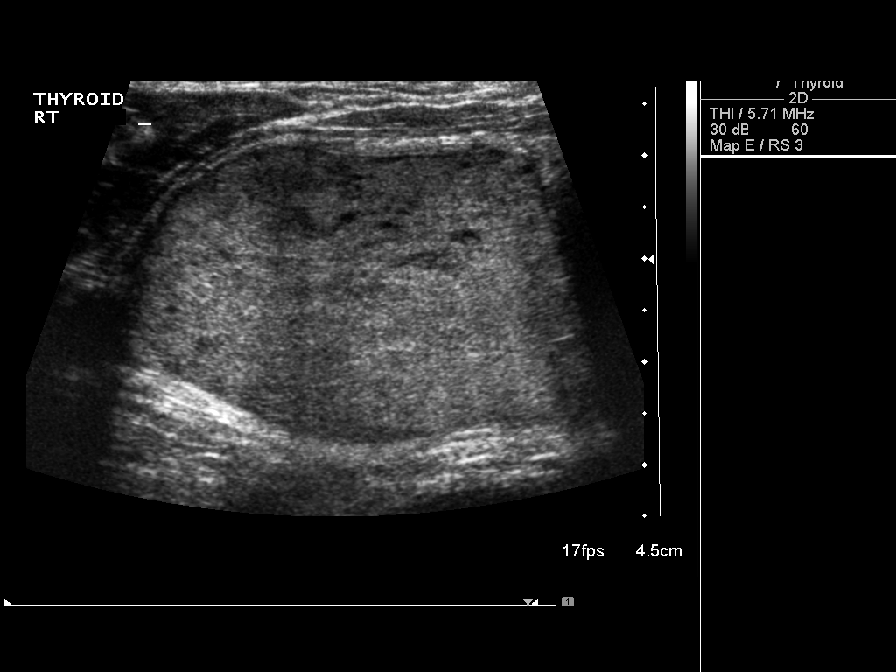
[im 2/9]
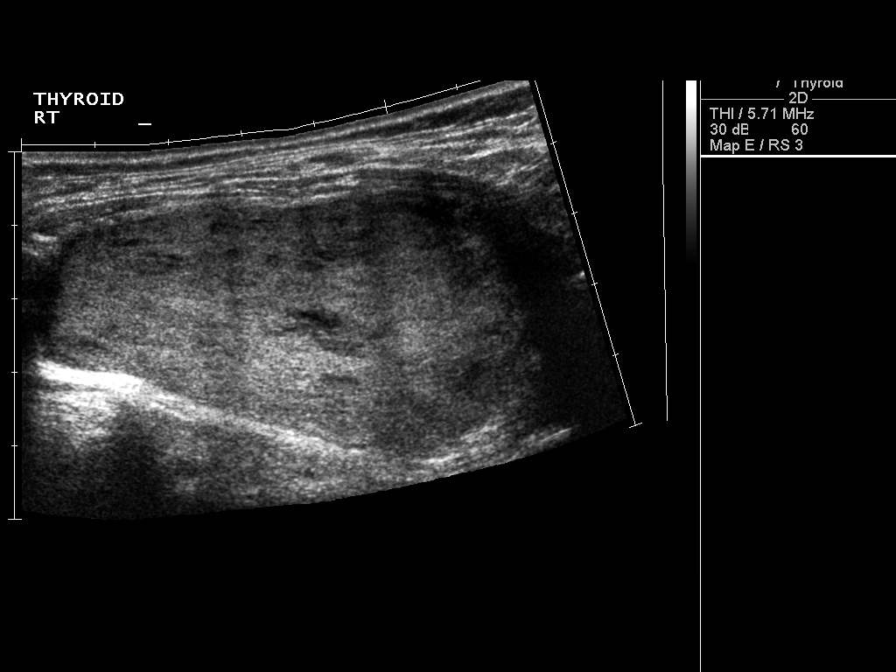
[im 3/9]
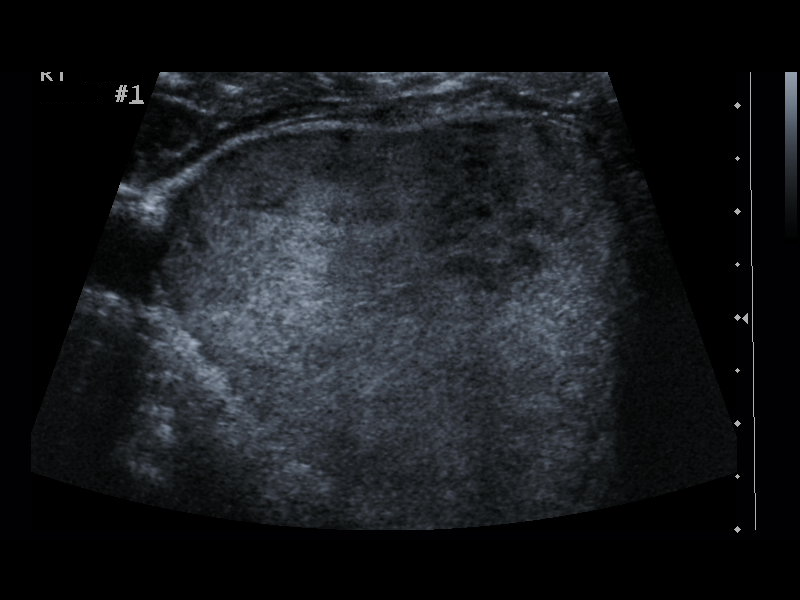
[im 4/9]
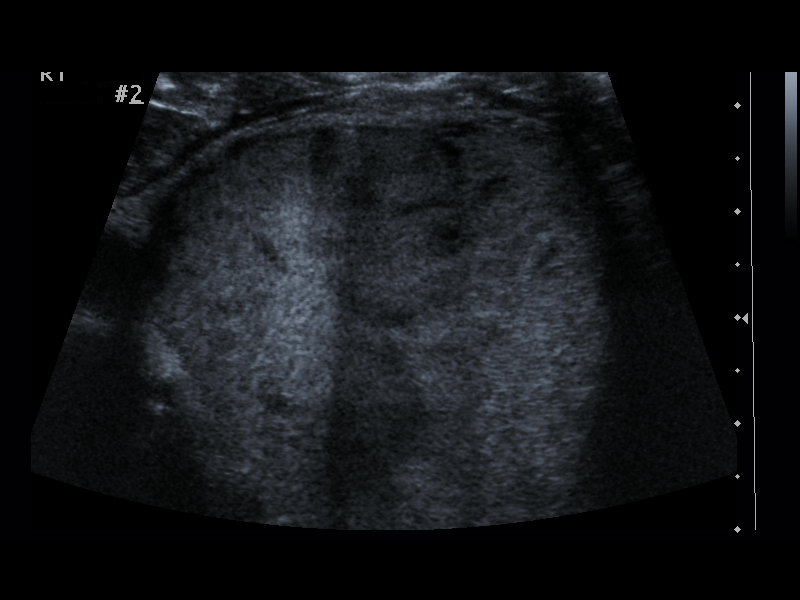
[im 5/9]
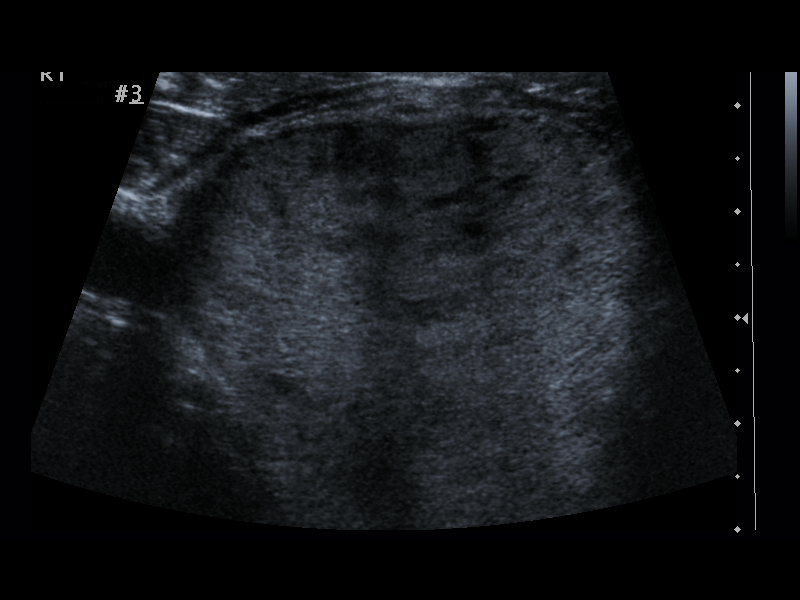
[im 6/9]
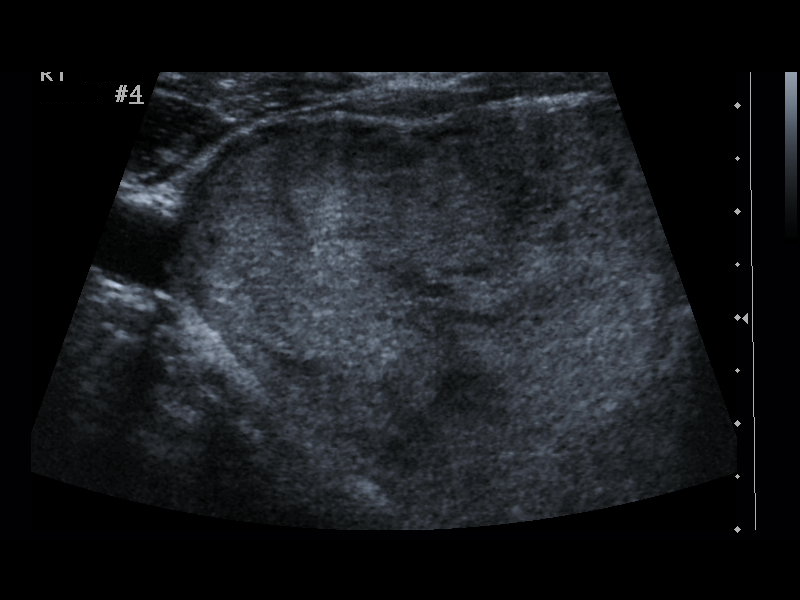
[im 7/9]
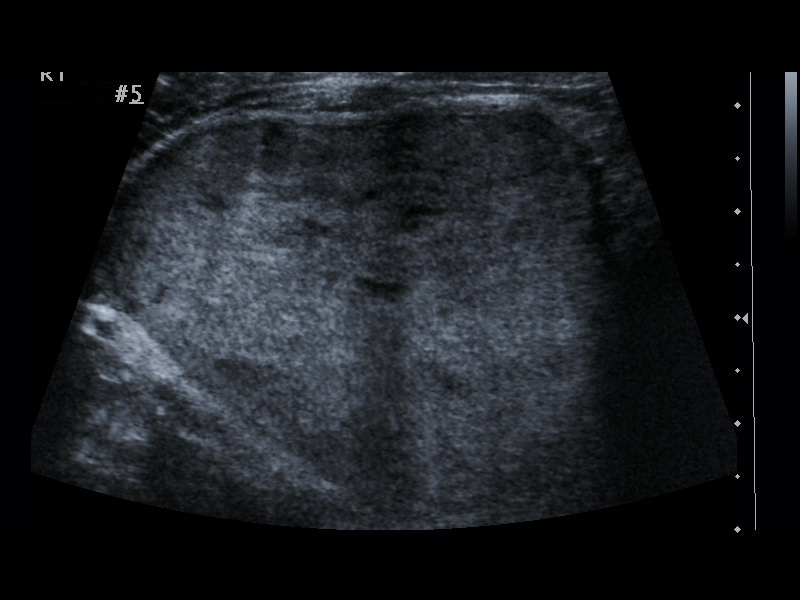
[im 8/9]
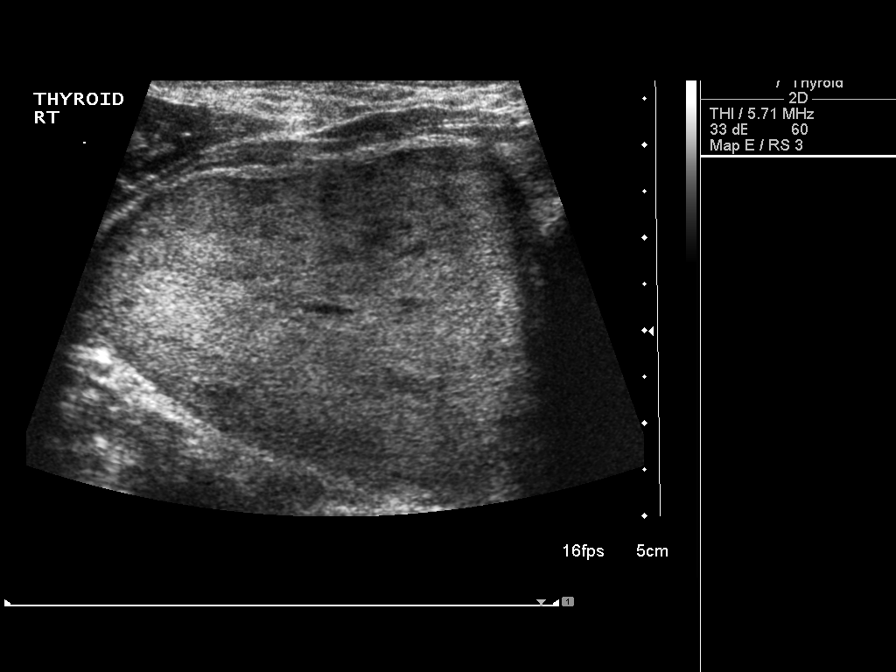
[im 9/9]
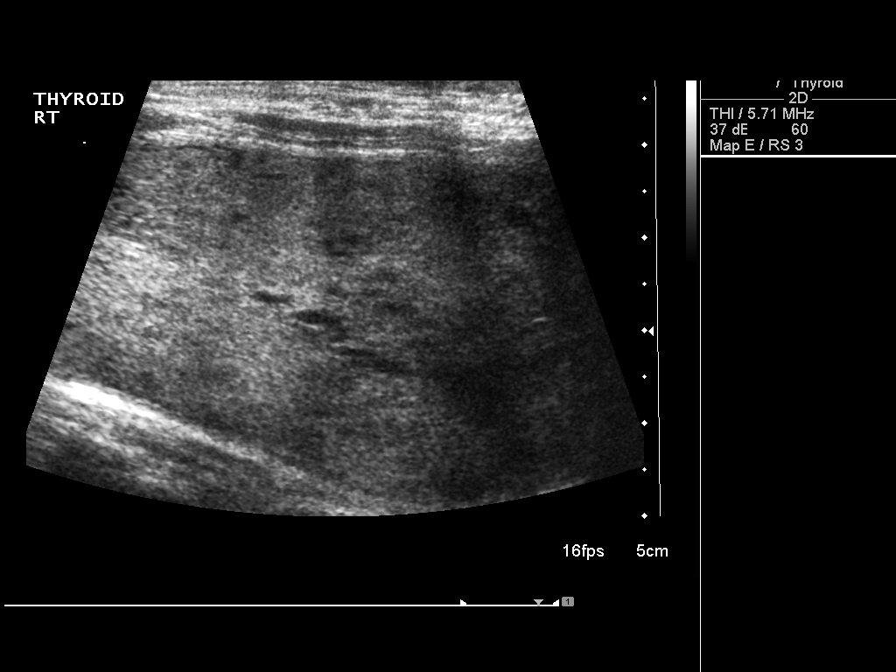

[9 of 9 positions shown; findings below may reference images not displayed]

Pre-procedural ultrasound scanning demonstrated unchanged size and
appearance of the indeterminate nodule within the right lobe of the
thyroid.

The procedure was planned. The neck was prepped in the usual sterile
fashion, and a sterile drape was applied covering the operative
field. A timeout was performed prior to the initiation of the
procedure. Local anesthesia was provided with 1% lidocaine.

Under direct ultrasound guidance, 5 FNA biopsies were performed of
the right thyroid nodule with a 25 gauge needle. Multiple ultrasound
images were saved for procedural documentation purposes. The samples
were prepared and submitted to pathology.

Limited post procedural scanning was negative for hematoma or
additional complication. Dressings were placed. The patient
tolerated the above procedures procedure well without immediate
postprocedural complication.
FINDINGS: FINDINGS
Nodule reference number based on prior diagnostic ultrasound: 1

Maximum size:  6.6 cm

Location: Right  ;  Mid

ACR TI-RADS total points: 4

ACR TI-RADS risk category:  TR4

Prior biopsy:  No

Reason for biopsy: meets ACR TI-RADS criteria

Ultrasound imaging confirms appropriate placement of the needles
within the thyroid nodule.
IMPRESSION: Technically successful ultrasound guided fine needle aspiration of
the right thyroid nodule.

## 2018-02-17 ENCOUNTER — Encounter: Payer: Self-pay | Admitting: Gastroenterology

## 2018-02-24 ENCOUNTER — Encounter: Payer: Medicare Other | Admitting: Family Medicine

## 2018-02-24 ENCOUNTER — Ambulatory Visit: Payer: Medicare Other

## 2018-03-03 ENCOUNTER — Encounter: Payer: Self-pay | Admitting: General Practice

## 2018-03-03 ENCOUNTER — Encounter: Payer: Self-pay | Admitting: Family Medicine

## 2018-03-03 ENCOUNTER — Ambulatory Visit: Payer: Medicare Other | Admitting: Family Medicine

## 2018-03-03 ENCOUNTER — Other Ambulatory Visit: Payer: Self-pay

## 2018-03-03 VITALS — BP 126/82 | HR 81 | Temp 98.4°F | Resp 16 | Ht 68.0 in | Wt 259.1 lb

## 2018-03-03 DIAGNOSIS — Z0000 Encounter for general adult medical examination without abnormal findings: Secondary | ICD-10-CM

## 2018-03-03 LAB — LIPID PANEL
CHOLESTEROL: 141 mg/dL (ref 0–200)
HDL: 54.2 mg/dL (ref >39.00)
LDL CALC: 67 mg/dL (ref 0–99)
NonHDL: 87.2
TRIGLYCERIDES: 102 mg/dL (ref 0.0–149.0)
Total CHOL/HDL Ratio: 3
VLDL: 20.4 mg/dL (ref 0.0–40.0)

## 2018-03-03 LAB — CBC WITH DIFFERENTIAL/PLATELET
BASOS ABS: 0 10*3/uL (ref 0.0–0.1)
Basophils Relative: 0.6 % (ref 0.0–3.0)
Eosinophils Absolute: 0.1 10*3/uL (ref 0.0–0.7)
Eosinophils Relative: 1.6 % (ref 0.0–5.0)
HEMATOCRIT: 40.9 % (ref 36.0–46.0)
Hemoglobin: 14.2 g/dL (ref 12.0–15.0)
LYMPHS PCT: 24.6 % (ref 12.0–46.0)
Lymphs Abs: 0.9 10*3/uL (ref 0.7–4.0)
MCHC: 34.6 g/dL (ref 30.0–36.0)
MCV: 98 fl (ref 78.0–100.0)
MONOS PCT: 9.2 % (ref 3.0–12.0)
Monocytes Absolute: 0.3 10*3/uL (ref 0.1–1.0)
NEUTROS PCT: 64 % (ref 43.0–77.0)
Neutro Abs: 2.4 10*3/uL (ref 1.4–7.7)
Platelets: 247 10*3/uL (ref 150.0–400.0)
RBC: 4.18 Mil/uL (ref 3.87–5.11)
RDW: 13.6 % (ref 11.5–15.5)
WBC: 3.7 10*3/uL — ABNORMAL LOW (ref 4.0–10.5)

## 2018-03-03 LAB — HEPATIC FUNCTION PANEL
ALBUMIN: 4.3 g/dL (ref 3.5–5.2)
ALT: 16 U/L (ref 0–35)
AST: 18 U/L (ref 0–37)
Alkaline Phosphatase: 69 U/L (ref 39–117)
Bilirubin, Direct: 0.2 mg/dL (ref 0.0–0.3)
TOTAL PROTEIN: 7.1 g/dL (ref 6.0–8.3)
Total Bilirubin: 1.3 mg/dL — ABNORMAL HIGH (ref 0.2–1.2)

## 2018-03-03 LAB — BASIC METABOLIC PANEL
BUN: 17 mg/dL (ref 6–23)
CALCIUM: 10.2 mg/dL (ref 8.4–10.5)
CO2: 29 mEq/L (ref 19–32)
Chloride: 102 mEq/L (ref 96–112)
Creatinine, Ser: 0.68 mg/dL (ref 0.40–1.20)
GFR: 90 mL/min (ref >60.00)
GLUCOSE: 99 mg/dL (ref 70–99)
Potassium: 4 mEq/L (ref 3.5–5.1)
Sodium: 138 mEq/L (ref 135–145)

## 2018-03-03 LAB — TSH: TSH: 2.6 u[IU]/mL (ref 0.35–4.50)

## 2018-03-03 MED ORDER — PRAVASTATIN SODIUM 40 MG PO TABS: 40.0000 mg | ORAL_TABLET | Freq: Every day | ORAL | 1 refills | Status: DC

## 2018-03-03 MED ORDER — HYDROCHLOROTHIAZIDE 12.5 MG PO TABS: 12.5000 mg | ORAL_TABLET | Freq: Every day | ORAL | 1 refills | Status: DC

## 2018-03-03 MED ORDER — LOSARTAN POTASSIUM-HCTZ 50-12.5 MG PO TABS: 1.0000 | ORAL_TABLET | Freq: Every day | ORAL | 1 refills | Status: DC

## 2018-03-03 NOTE — Assessment & Plan Note (Signed)
Ongoing issue for pt.  Stressed need for healthy diet and regular exercise.  Check labs to risk stratify.  Will follow 

## 2018-03-03 NOTE — Assessment & Plan Note (Signed)
Pt's PE WNL w/ exception of obesity.  Colonoscopy scheduled, pt to schedule mammo.  UTD on immunizations.  Check labs.  Anticipatory guidance provided.

## 2018-03-03 NOTE — Patient Instructions (Signed)
Follow up in 6 months to recheck BP and cholesterol We'll notify you of your lab results and make any changes if needed Continue to work on healthy diet and regular exercise- you can do it! Call and schedule your mammogram! Call with any questions or concerns Happy Holidays!!!

## 2018-03-03 NOTE — Progress Notes (Addendum)
   Subjective:    Patient ID: Shelby Reeves, female    DOB: [birth year], 72 y.o.   MRN: [id number]  HPI CPE- UTD on mammo (pt prefers every other year screening- due in Dec).  Colonoscopy is scheduled.  UTD on immunizations.   Review of Systems Patient reports no vision/ hearing changes, adenopathy,fever, weight change,  persistant/recurrent hoarseness , swallowing issues, chest pain, palpitations, edema, persistant/recurrent cough, hemoptysis, dyspnea (rest/exertional/paroxysmal nocturnal), gastrointestinal bleeding (melena, rectal bleeding), abdominal pain, significant heartburn, bowel changes, GU symptoms (dysuria, hematuria, incontinence), Gyn symptoms (abnormal  bleeding, pain),  syncope, focal weakness, memory loss, numbness & tingling, skin/hair/nail changes, abnormal bruising or bleeding, anxiety, or depression.     Objective:   Physical Exam General Appearance:    Alert, cooperative, no distress, appears stated age  Head:    Normocephalic, without obvious abnormality, atraumatic  Eyes:    PERRL, conjunctiva/corneas clear, EOM's intact, fundi    benign, both eyes  Ears:    Normal TM's and external ear canals, both ears  Nose:   Nares normal, septum midline, mucosa normal, no drainage    or sinus tenderness  Throat:   Lips, mucosa, and tongue normal; teeth and gums normal  Neck:   Supple, symmetrical, trachea midline, no adenopathy;    Thyroid: R thyromegaly (bx negative)  Back:     Symmetric, no curvature, ROM normal, no CVA tenderness  Lungs:     Clear to auscultation bilaterally, respirations unlabored  Chest Wall:    No tenderness or deformity   Heart:    Regular rate and rhythm, S1 and S2 normal, no murmur, rub   or gallop  Breast Exam:    Deferred to mammo  Abdomen:     Soft, non-tender, bowel sounds active all four quadrants,    no masses, no organomegaly  Genitalia:    Deferred  Rectal:    Extremities:   Extremities normal, atraumatic, no cyanosis or edema  Pulses:    2+ and symmetric all extremities  Skin:   Skin color, texture, turgor normal, no rashes or lesions  Lymph nodes:   Cervical, supraclavicular, and axillary nodes normal  Neurologic:   CNII-XII intact, normal strength, sensation and reflexes    throughout          Assessment & Plan:

## 2018-03-15 ENCOUNTER — Ambulatory Visit (AMBULATORY_SURGERY_CENTER): Payer: Self-pay

## 2018-03-15 ENCOUNTER — Other Ambulatory Visit: Payer: Self-pay

## 2018-03-15 VITALS — Ht 67.0 in | Wt 256.6 lb

## 2018-03-15 DIAGNOSIS — Z86010 Personal history of colonic polyps: Secondary | ICD-10-CM

## 2018-03-15 MED ORDER — NA SULFATE-K SULFATE-MG SULF 17.5-3.13-1.6 GM/177ML PO SOLN: 1.0000 | Freq: Once | ORAL | 0 refills | Status: AC

## 2018-03-15 NOTE — Progress Notes (Signed)
No egg or soy allergy known to patient  No issues with past sedation with any surgeries  or procedures, no intubation problems  No diet pills per patient No home 02 use per patient  No blood thinners per patient  Pt denies issues with constipation  No A fib or A flutter  EMMI video sent to pt's e mail , pt declined    

## 2018-03-22 ENCOUNTER — Telehealth: Payer: Self-pay | Admitting: Gastroenterology

## 2018-03-22 NOTE — Telephone Encounter (Signed)
Prep is not covered by ins will like for something else to be sent in

## 2018-03-22 NOTE — Telephone Encounter (Signed)
Spoke with patient. Suprep $140. Suprep sample given. Pt aware it will be at 3 rd floor front desk. Lot. [id number],exp.10/21 1 kit.

## 2018-03-23 ENCOUNTER — Encounter: Payer: Self-pay | Admitting: Gastroenterology

## 2018-03-29 ENCOUNTER — Encounter: Payer: Medicare Other | Admitting: Gastroenterology

## 2018-04-08 ENCOUNTER — Encounter: Payer: Self-pay | Admitting: Gastroenterology

## 2018-04-08 ENCOUNTER — Ambulatory Visit (AMBULATORY_SURGERY_CENTER): Payer: Medicare Other | Admitting: Gastroenterology

## 2018-04-08 VITALS — BP 128/68 | HR 54 | Temp 97.5°F | Resp 16 | Ht 68.0 in | Wt 259.0 lb

## 2018-04-08 DIAGNOSIS — Z1211 Encounter for screening for malignant neoplasm of colon: Secondary | ICD-10-CM | POA: Diagnosis not present

## 2018-04-08 DIAGNOSIS — D123 Benign neoplasm of transverse colon: Secondary | ICD-10-CM

## 2018-04-08 DIAGNOSIS — K635 Polyp of colon: Secondary | ICD-10-CM | POA: Diagnosis not present

## 2018-04-08 DIAGNOSIS — Z86010 Personal history of colonic polyps: Secondary | ICD-10-CM | POA: Diagnosis not present

## 2018-04-08 MED ORDER — SODIUM CHLORIDE 0.9 % IV SOLN: 500.0000 mL | Freq: Once | INTRAVENOUS | Status: DC

## 2018-04-08 NOTE — Op Note (Signed)
[facility] Patient Name: Shelby Reeves Procedure Date: 04/08/2018 8:29 AM MRN: [id number] Endoscopist: Ladene Artist , MD Age: 72 Referring MD:  Date of Birth: 07/16/1945 Gender: Female Account #: [id number] Procedure:                Colonoscopy Indications:              Surveillance: Personal history of adenomatous                            polyps on last colonoscopy > 5 years ago Medicines:                Monitored Anesthesia Care Procedure:                Pre-Anesthesia Assessment:                           - Prior to the procedure, a History and Physical                            was performed, and patient medications and                            allergies were reviewed. The patient's tolerance of                            previous anesthesia was also reviewed. The risks                            and benefits of the procedure and the sedation                            options and risks were discussed with the patient.                            All questions were answered, and informed consent                            was obtained. Prior Anticoagulants: The patient has                            taken no previous anticoagulant or antiplatelet                            agents. ASA Grade Assessment: II - A patient with                            mild systemic disease. After reviewing the risks                            and benefits, the patient was deemed in                            satisfactory condition to undergo the procedure.  After obtaining informed consent, the colonoscope                            was passed under direct vision. Throughout the                            procedure, the patient's blood pressure, pulse, and                            oxygen saturations were monitored continuously. The                            Colonoscope was introduced through the anus and                            advanced to the the  cecum, identified by                            appendiceal orifice and ileocecal valve. The                            ileocecal valve, appendiceal orifice, and rectum                            were photographed. The quality of the bowel                            preparation was adequate. The colonoscopy was                            performed without difficulty. The patient tolerated                            the procedure well. Scope In: 8:45:32 AM Scope Out: 9:02:05 AM Scope Withdrawal Time: 0 hours 11 minutes 36 seconds  Total Procedure Duration: 0 hours 16 minutes 33 seconds  Findings:                 The perianal and digital rectal examinations were                            normal.                           A 7 mm polyp was found in the transverse colon. The                            polyp was sessile. The polyp was removed with a                            cold snare. Resection and retrieval were complete.                           Multiple medium-mouthed diverticula were found in  the left colon. There was narrowing of the colon in                            association with the diverticular opening. There                            was evidence of diverticular spasm. There was no                            evidence of diverticular bleeding.                           Internal hemorrhoids were found during                            retroflexion. The hemorrhoids were small and Grade                            I (internal hemorrhoids that do not prolapse).                           The exam was otherwise without abnormality on                            direct and retroflexion views. Complications:            No immediate complications. Estimated blood loss:                            None. Estimated Blood Loss:     Estimated blood loss: none. Impression:               - One 7 mm polyp in the transverse colon, removed                             with a cold snare. Resected and retrieved.                           - Moderate diverticulosis in the left colon.                           - Internal hemorrhoids. Recommendation:           - Repeat colonoscopy in 5 years for surveillance.                           - Patient has a contact number available for                            emergencies. The signs and symptoms of potential                            delayed complications were discussed with the                            patient. Return  to normal activities tomorrow.                            Written discharge instructions were provided to the                            patient.                           - High fiber diet.                           - Continue present medications.                           - Await pathology results. Ladene Artist, MD 04/08/2018 9:05:20 AM This report has been signed electronically.

## 2018-04-08 NOTE — Progress Notes (Signed)
Pt's states no medical or surgical changes since previsit or office visit. 

## 2018-04-08 NOTE — Progress Notes (Signed)
To PACU, VSS. Report to [email or web address].tb 

## 2018-04-08 NOTE — Progress Notes (Signed)
Called to room to assist during endoscopic procedure.  Patient ID and intended procedure confirmed with present staff. Received instructions for my participation in the procedure from the performing physician.  

## 2018-04-08 NOTE — Patient Instructions (Signed)
YOU HAD AN ENDOSCOPIC PROCEDURE TODAY AT THE [facility]:   Refer to the procedure report that was given to you for any specific questions about what was found during the examination.  If the procedure report does not answer your questions, please call your gastroenterologist to clarify.  If you requested that your care partner not be given the details of your procedure findings, then the procedure report has been included in a sealed envelope for you to review at your convenience later.  YOU SHOULD EXPECT: Some feelings of bloating in the abdomen. Passage of more gas than usual.  Walking can help get rid of the air that was put into your GI tract during the procedure and reduce the bloating. If you had a lower endoscopy (such as a colonoscopy or flexible sigmoidoscopy) you may notice spotting of blood in your stool or on the toilet paper. If you underwent a bowel prep for your procedure, you may not have a normal bowel movement for a few days.  Please Note:  You might notice some irritation and congestion in your nose or some drainage.  This is from the oxygen used during your procedure.  There is no need for concern and it should clear up in a day or so.  SYMPTOMS TO REPORT IMMEDIATELY:   Following lower endoscopy (colonoscopy or flexible sigmoidoscopy):  Excessive amounts of blood in the stool  Significant tenderness or worsening of abdominal pains  Swelling of the abdomen that is new, acute  Fever of 100F or higher  For urgent or emergent issues, a gastroenterologist can be reached at any hour by calling [phone].   DIET:  We do recommend a small meal at first, but then you may proceed to your regular diet.  Drink plenty of fluids but you should avoid alcoholic beverages for 24 hours.  ACTIVITY:  You should plan to take it easy for the rest of today and you should NOT DRIVE or use heavy machinery until tomorrow (because of the sedation medicines used during the test).     FOLLOW UP: Our staff will call the number listed on your records the next business day following your procedure to check on you and address any questions or concerns that you may have regarding the information given to you following your procedure. If we do not reach you, we will leave a message.  However, if you are feeling well and you are not experiencing any problems, there is no need to return our call.  We will assume that you have returned to your regular daily activities without incident.  If any biopsies were taken you will be contacted by phone or by letter within the next 1-3 weeks.  Please call us at [phone] if you have not heard about the biopsies in 3 weeks.    SIGNATURES/CONFIDENTIALITY: You and/or your care partner have signed paperwork which will be entered into your electronic medical record.  These signatures attest to the fact that that the information above on your After Visit Summary has been reviewed and is understood.  Full responsibility of the confidentiality of this discharge information lies with you and/or your care-partner. 

## 2018-04-09 ENCOUNTER — Telehealth: Payer: Self-pay

## 2018-04-09 NOTE — Telephone Encounter (Signed)
  Follow up Call-  Call back number 04/08/2018  Post procedure Call Back phone  # [phone]  Permission to leave phone message Yes  Some recent data might be hidden     Patient questions:  Do you have a fever, pain , or abdominal swelling? No. Pain Score  0 *  Have you tolerated food without any problems? Yes.    Have you been able to return to your normal activities? Yes.    Do you have any questions about your discharge instructions: Diet   No. Medications  No. Follow up visit  No.  Do you have questions or concerns about your Care? No.  Actions: * If pain score is 4 or above: No action needed, pain <4.

## 2018-04-26 ENCOUNTER — Encounter: Payer: Self-pay | Admitting: Gastroenterology

## 2018-06-03 ENCOUNTER — Telehealth: Payer: Self-pay

## 2018-06-03 MED ORDER — TIZANIDINE HCL 4 MG PO TABS: 4.0000 mg | ORAL_TABLET | Freq: Four times a day (QID) | ORAL | 0 refills | Status: DC | PRN

## 2018-06-03 NOTE — Addendum Note (Signed)
Addended by: Davis Gourd on: 06/03/2018 02:55 PM   Modules accepted: Orders

## 2018-06-03 NOTE — Telephone Encounter (Signed)
Pt notified and med filled to local pharmacy as requested.

## 2018-06-03 NOTE — Telephone Encounter (Signed)
Typically Medicare doesn't cover Flexeril so we can send Tizandine 4mg  TID PRN, #30, no refills.  If no improvement, will need appt

## 2018-06-03 NOTE — Telephone Encounter (Signed)
Ok to send in.  

## 2018-06-03 NOTE — Telephone Encounter (Signed)
Copied from [place] [id number]. Topic: General - Other >> Jun 03, 2018  9:58 AM Oneta Rack wrote: Relation to pt: self  Call back number: [phone]  Pharmacy: [id number] - [place], [place] [phone] (Phone) [phone] (Fax)   Reason for call: Patient requesting flexeril due to back muscle spasm, patient declined appointment , please advise

## 2018-07-01 DIAGNOSIS — H40013 Open angle with borderline findings, low risk, bilateral: Secondary | ICD-10-CM | POA: Diagnosis not present

## 2018-07-01 DIAGNOSIS — Z961 Presence of intraocular lens: Secondary | ICD-10-CM | POA: Diagnosis not present

## 2018-07-01 DIAGNOSIS — H26492 Other secondary cataract, left eye: Secondary | ICD-10-CM | POA: Diagnosis not present

## 2018-07-01 DIAGNOSIS — H26491 Other secondary cataract, right eye: Secondary | ICD-10-CM | POA: Diagnosis not present

## 2018-07-07 ENCOUNTER — Encounter: Payer: Self-pay | Admitting: Family Medicine

## 2018-07-07 ENCOUNTER — Ambulatory Visit (INDEPENDENT_AMBULATORY_CARE_PROVIDER_SITE_OTHER): Payer: Medicare Other | Admitting: Family Medicine

## 2018-07-07 ENCOUNTER — Ambulatory Visit: Payer: Medicare Other

## 2018-07-07 ENCOUNTER — Other Ambulatory Visit: Payer: Self-pay

## 2018-07-07 VITALS — BP 110/74 | HR 78 | Temp 98.8°F | Resp 17 | Ht 68.0 in | Wt 268.0 lb

## 2018-07-07 DIAGNOSIS — Z1231 Encounter for screening mammogram for malignant neoplasm of breast: Secondary | ICD-10-CM | POA: Diagnosis not present

## 2018-07-07 DIAGNOSIS — E2839 Other primary ovarian failure: Secondary | ICD-10-CM | POA: Diagnosis not present

## 2018-07-07 DIAGNOSIS — Z0000 Encounter for general adult medical examination without abnormal findings: Secondary | ICD-10-CM

## 2018-07-07 NOTE — Patient Instructions (Signed)
Follow up in 6 months to recheck BP and cholesterol We'll notify you of your lab results and make any changes if needed Continue to work on healthy diet and regular exercise- you can do it! [facility] should call you with your mammogram and bone density Call with any questions or concerns Happy Early Birthday!!

## 2018-07-07 NOTE — Assessment & Plan Note (Signed)
Pt's PE WNL w/ exception of obesity and known R sided thyroid nodule.  UTD on colonoscopy, immunizations.  Mammo and DEXA ordered.  Check labs.  Anticipatory guidance provided.

## 2018-07-07 NOTE — Progress Notes (Signed)
   Subjective:    Patient ID: Shelby Reeves, female    DOB: [birth year], 73 y.o.   MRN: [id number]  HPI CPE- UTD on colonoscopy, due for mammo and DEXA.  Pt has gained 9 lbs since last visit   Review of Systems Patient reports no vision/ hearing changes, adenopathy,fever, weight change,  persistant/recurrent hoarseness , swallowing issues, chest pain, palpitations, edema, persistant/recurrent cough, hemoptysis, dyspnea (rest/exertional/paroxysmal nocturnal), gastrointestinal bleeding (melena, rectal bleeding), abdominal pain, significant heartburn, bowel changes, GU symptoms (dysuria, hematuria, incontinence), Gyn symptoms (abnormal  bleeding, pain),  syncope, focal weakness, memory loss, numbness & tingling, skin/hair/nail changes, abnormal bruising or bleeding, anxiety, or depression.     Objective:   Physical Exam General Appearance:    Alert, cooperative, no distress, appears stated age, obese  Head:    Normocephalic, without obvious abnormality, atraumatic  Eyes:    PERRL, conjunctiva/corneas clear, EOM's intact, fundi    benign, both eyes  Ears:    Normal TM's and external ear canals, both ears  Nose:   Nares normal, septum midline, mucosa normal, no drainage    or sinus tenderness  Throat:   Lips, mucosa, and tongue normal; teeth and gums normal  Neck:   Supple, symmetrical, trachea midline, no adenopathy;    Thyroid: R sided thyroid nodule  Back:     Symmetric, no curvature, ROM normal, no CVA tenderness  Lungs:     Clear to auscultation bilaterally, respirations unlabored  Chest Wall:    No tenderness or deformity   Heart:    Regular rate and rhythm, S1 and S2 normal, no murmur, rub   or gallop  Breast Exam:    Deferred to mammo  Abdomen:     Soft, non-tender, bowel sounds active all four quadrants,    no masses, no organomegaly  Genitalia:    Deferred  Rectal:    Extremities:   Extremities normal, atraumatic, no cyanosis or edema  Pulses:   2+ and symmetric all extremities   Skin:   Skin color, texture, turgor normal, no rashes or lesions  Lymph nodes:   Cervical, supraclavicular, and axillary nodes normal  Neurologic:   CNII-XII intact, normal strength, sensation and reflexes    throughout          Assessment & Plan:

## 2018-07-07 NOTE — Assessment & Plan Note (Signed)
Ongoing issue for pt.  Worsening as pt continues to gain weight.  Stressed need for healthy diet and regular exercise.  Check labs to risk stratify.  Will follow.

## 2018-07-08 LAB — CBC WITH DIFFERENTIAL/PLATELET
Basophils Absolute: 0 10*3/uL (ref 0.0–0.1)
Basophils Relative: 0.7 % (ref 0.0–3.0)
Eosinophils Absolute: 0.1 10*3/uL (ref 0.0–0.7)
Eosinophils Relative: 1.6 % (ref 0.0–5.0)
HCT: 41.5 % (ref 36.0–46.0)
Hemoglobin: 14.3 g/dL (ref 12.0–15.0)
Lymphocytes Relative: 33.1 % (ref 12.0–46.0)
Lymphs Abs: 1.3 10*3/uL (ref 0.7–4.0)
MCHC: 34.4 g/dL (ref 30.0–36.0)
MCV: 96.5 fl (ref 78.0–100.0)
Monocytes Absolute: 0.5 10*3/uL (ref 0.1–1.0)
Monocytes Relative: 11.5 % (ref 3.0–12.0)
Neutro Abs: 2.1 10*3/uL (ref 1.4–7.7)
Neutrophils Relative %: 53.1 % (ref 43.0–77.0)
Platelets: 256 10*3/uL (ref 150.0–400.0)
RBC: 4.3 Mil/uL (ref 3.87–5.11)
RDW: 13.9 % (ref 11.5–15.5)
WBC: 4 10*3/uL (ref 4.0–10.5)

## 2018-07-08 LAB — BASIC METABOLIC PANEL
BUN: 17 mg/dL (ref 6–23)
CO2: 28 mEq/L (ref 19–32)
Calcium: 10 mg/dL (ref 8.4–10.5)
Chloride: 102 mEq/L (ref 96–112)
Creatinine, Ser: 0.76 mg/dL (ref 0.40–1.20)
GFR: 74.4 mL/min (ref >60.00)
Glucose, Bld: 86 mg/dL (ref 70–99)
Potassium: 3.9 mEq/L (ref 3.5–5.1)
Sodium: 138 mEq/L (ref 135–145)

## 2018-07-08 LAB — HEPATIC FUNCTION PANEL
ALT: 18 U/L (ref 0–35)
AST: 20 U/L (ref 0–37)
Albumin: 4.4 g/dL (ref 3.5–5.2)
Alkaline Phosphatase: 75 U/L (ref 39–117)
BILIRUBIN TOTAL: 1.1 mg/dL (ref 0.2–1.2)
Bilirubin, Direct: 0.2 mg/dL (ref 0.0–0.3)
Total Protein: 6.8 g/dL (ref 6.0–8.3)

## 2018-07-08 LAB — LIPID PANEL
Cholesterol: 141 mg/dL (ref 0–200)
HDL: 52.9 mg/dL (ref >39.00)
LDL Cholesterol: 68 mg/dL (ref 0–99)
NONHDL: 88.44
Total CHOL/HDL Ratio: 3
Triglycerides: 101 mg/dL (ref 0.0–149.0)
VLDL: 20.2 mg/dL (ref 0.0–40.0)

## 2018-07-08 LAB — TSH: TSH: 3.3 u[IU]/mL (ref 0.35–4.50)

## 2018-07-14 ENCOUNTER — Other Ambulatory Visit: Payer: Self-pay | Admitting: General Practice

## 2018-07-14 MED ORDER — LOSARTAN POTASSIUM-HCTZ 50-12.5 MG PO TABS: 1.0000 | ORAL_TABLET | Freq: Every day | ORAL | 1 refills | Status: DC

## 2018-07-16 ENCOUNTER — Ambulatory Visit (HOSPITAL_BASED_OUTPATIENT_CLINIC_OR_DEPARTMENT_OTHER): Payer: Medicare Other

## 2018-07-16 ENCOUNTER — Other Ambulatory Visit (HOSPITAL_BASED_OUTPATIENT_CLINIC_OR_DEPARTMENT_OTHER): Payer: Medicare Other

## 2018-08-11 ENCOUNTER — Ambulatory Visit: Payer: Medicare Other

## 2018-09-06 ENCOUNTER — Encounter (HOSPITAL_BASED_OUTPATIENT_CLINIC_OR_DEPARTMENT_OTHER): Payer: Self-pay

## 2018-09-06 ENCOUNTER — Ambulatory Visit (HOSPITAL_BASED_OUTPATIENT_CLINIC_OR_DEPARTMENT_OTHER)
Admission: RE | Admit: 2018-09-06 | Discharge: 2018-09-06 | Disposition: A | Discharge status: Home / Self Care | Payer: Medicare Other | Source: Ambulatory Visit | Attending: Family Medicine | Admitting: Family Medicine

## 2018-09-06 ENCOUNTER — Other Ambulatory Visit: Payer: Self-pay

## 2018-09-06 DIAGNOSIS — Z1231 Encounter for screening mammogram for malignant neoplasm of breast: Secondary | ICD-10-CM

## 2018-09-06 DIAGNOSIS — E2839 Other primary ovarian failure: Secondary | ICD-10-CM

## 2018-09-09 ENCOUNTER — Encounter: Payer: Self-pay | Admitting: Family Medicine

## 2018-09-10 ENCOUNTER — Other Ambulatory Visit: Payer: Self-pay

## 2018-09-10 ENCOUNTER — Ambulatory Visit (INDEPENDENT_AMBULATORY_CARE_PROVIDER_SITE_OTHER): Payer: Medicare Other | Admitting: Family Medicine

## 2018-09-10 ENCOUNTER — Telehealth: Payer: Self-pay | Admitting: Family Medicine

## 2018-09-10 ENCOUNTER — Encounter: Payer: Self-pay | Admitting: Family Medicine

## 2018-09-10 VITALS — BP 122/78 | HR 70 | Temp 98.8°F | Resp 16 | Ht 68.0 in | Wt 270.0 lb

## 2018-09-10 DIAGNOSIS — H6690 Otitis media, unspecified, unspecified ear: Secondary | ICD-10-CM

## 2018-09-10 MED ORDER — AMOXICILLIN-POT CLAVULANATE 875-125 MG PO TABS: 1.0000 | ORAL_TABLET | Freq: Two times a day (BID) | ORAL | 0 refills | Status: DC

## 2018-09-10 NOTE — Progress Notes (Signed)
Pre visit review using our clinic review tool, if applicable. No additional management support is needed unless otherwise documented below in the visit note. 

## 2018-09-10 NOTE — Telephone Encounter (Signed)
Per Dr. Birdie Riddle, okay for in office if she passes screening.

## 2018-09-10 NOTE — Progress Notes (Signed)
   Subjective:    Patient ID: Magdalen Spatz, female    DOB: 1945-06-26, 73 y.o.   MRN: [id number]  HPI Ear pain- R sided.  sxs started ~2 weeks ago.  'it just feels like it's stopped up'.  'I feel that throbbing like you're coming off a mountain'.  No drainage from ear.  No fevers.  No N/V.  No known sick contacts.   Review of Systems For ROS see HPI     Objective:   Physical Exam Vitals signs reviewed.  Constitutional:      General: She is not in acute distress.    Appearance: Normal appearance. She is not ill-appearing.  HENT:     Head: Normocephalic and atraumatic.     Left Ear: Tympanic membrane and ear canal normal.     Ears:     Comments: Pearly white drainage in R ear canal obstructing view of TM.  Irrigated.  Unable to remove all debris but visible portions of eardrum are opaque. Neck:     Musculoskeletal: Normal range of motion and neck supple.  Lymphadenopathy:     Cervical: No cervical adenopathy.  Neurological:     Mental Status: She is alert.           Assessment & Plan:  Otitis media- new.  Pt had no relief w/ irrigation and has dull, opaque TM.  Start Augmentin.  If no improvement may need ENT.  Reviewed supportive care and red flags that should prompt return.  Pt expressed understanding and is in agreement w/ plan.

## 2018-09-10 NOTE — Telephone Encounter (Signed)
Pt is scheduled °

## 2018-09-10 NOTE — Telephone Encounter (Signed)
Can this an in office visit or VV?    Copied from [place] [id number]. Topic: Appointment Scheduling - Scheduling Inquiry for Clinic >> Sep 09, 2018  4:16 PM Mathis Bud wrote: Reason for CRM: Patient is calling to make an app with PCP asap, Patient does know about virtual appts.  Patient believes she has ear infection. Ear has been bothering her for a couple days now. Patient call back # [phone]

## 2018-09-10 NOTE — Patient Instructions (Signed)
Follow up as needed or as scheduled START the Augmentin twice daily- take w/ food Continue the peroxide drops as needed Call with any questions or concerns Stay Safe!

## 2018-10-05 NOTE — Progress Notes (Addendum)
Subjective:   Shelby Reeves is a 73 y.o. female who presents for Medicare Annual (Subsequent) preventive examination.  Review of Systems:   Cardiac Risk Factors include: advanced age (>61men, >29 women);dyslipidemia;hypertension;obesity (BMI >30kg/m2);family history of premature cardiovascular disease   No ROS.  Medicare Wellness Virtual Visit.  Visual/audio telehealth visit, UTA vital signs.   See social history for additional risk factors.   Sleep patterns: Sleeps 3-6 hours.  Home Safety/Smoke Alarms: Feels safe in home. Smoke alarms in place.  Living environment; residence and Firearm Safety: Lives alone in 1 story home.  Seat Belt Safety/Bike Helmet: Wears seat belt.   Female:   [id number]       Mammo-09/06/2018, BI-RADS CATEGORY  1: Negative.       Dexa scan-09/06/2018,  Normal.      CCS-04/08/2018, polyp. Recall 5 years.      Objective:     Vitals: There were no vitals taken for this visit.  There is no height or weight on file to calculate BMI.  Advanced Directives 10/06/2018 02/18/2017  Does Patient Have a Medical Advance Directive? Yes Yes  Type of Paramedic of [place];Living will Living will;Healthcare Power of [place] in Chart? No - copy requested No - copy requested    Tobacco Social History   Tobacco Use  Smoking Status Never Smoker  Smokeless Tobacco Never Used     Counseling given: Not Answered    Past Medical History:  Diagnosis Date  . Allergy   . Carpal tunnel syndrome   . Fibroid   . GERD (gastroesophageal reflux disease)   . History of colon polyps 2002  . Hyperlipidemia   . Hypertension   . Vertigo    Past Surgical History:  Procedure Laterality Date  . BIOPSY THYROID     2018  . CARPAL TUNNEL RELEASE     bilateral  . CATARACT EXTRACTION W/ INTRAOCULAR LENS  IMPLANT, BILATERAL Bilateral 2018   July and August  . COLONOSCOPY     hx tubular adenomas 2003  .  ENDOMETRIAL BIOPSY  09-12-09   --benign/Dr. Romine  . HYSTEROSCOPY  10-22-09   resection of polyps and D & C-Dr. Joan Flores  . HYSTEROSCOPY  06-17-91   and D & C  . KNEE ARTHROSCOPY  2010   right  . POLYPECTOMY    . UTERINE FIBROID SURGERY     Family History  Problem Relation Age of Onset  . Heart disease Mother   . Hypertension Mother   . Diabetes Mother   . Cancer Father        colon cancer?  . Stroke Sister   . Lung cancer Sister   . Colon cancer Neg Hx   . Stomach cancer Neg Hx   . Esophageal cancer Neg Hx   . Rectal cancer Neg Hx    Social History   Socioeconomic History  . Marital status: Widowed    Spouse name: Not on file  . Number of children: Not on file  . Years of education: Not on file  . Highest education level: Not on file  Occupational History  . Not on file  Social Needs  . Financial resource strain: Not on file  . Food insecurity:    Worry: Not on file    Inability: Not on file  . Transportation needs:    Medical: Not on file    Non-medical: Not on file  Tobacco Use  . Smoking status: Never  Smoker  . Smokeless tobacco: Never Used  Substance and Sexual Activity  . Alcohol use: Yes    Comment: rarely  . Drug use: No  . Sexual activity: Yes    Partners: Male  Lifestyle  . Physical activity:    Days per week: Not on file    Minutes per session: Not on file  . Stress: Not on file  Relationships  . Social connections:    Talks on phone: Not on file    Gets together: Not on file    Attends religious service: Not on file    Active member of club or organization: Not on file    Attends meetings of clubs or organizations: Not on file    Relationship status: Not on file  Other Topics Concern  . Not on file  Social History Narrative  . Not on file    Outpatient Encounter Medications as of 10/06/2018  Medication Sig  . aspirin 81 MG tablet Take 81 mg by mouth daily.    . hydrochlorothiazide (HYDRODIURIL) 12.5 MG tablet Take 1 tablet (12.5 mg  total) by mouth daily.  Marland Kitchen losartan (COZAAR) 50 MG tablet Take 50 mg by mouth daily.  . Multiple Vitamin (MULTIVITAMIN) tablet Take 1 tablet by mouth daily.    . Pantoprazole Sodium (PROTONIX PO) Take by mouth 3 times/day as needed-between meals & bedtime.  . pravastatin (PRAVACHOL) 40 MG tablet Take 1 tablet (40 mg total) by mouth daily.  Marland Kitchen losartan-hydrochlorothiazide (HYZAAR) 50-12.5 MG tablet Take 1 tablet by mouth daily. (Patient not taking: Reported on 10/06/2018)  . Tdap (BOOSTRIX) 5-2.5-18.5 LF-MCG/0.5 injection Inject 0.5 mLs into the muscle once for 1 dose.  Marland Kitchen tiZANidine (ZANAFLEX) 4 MG tablet Take 1 tablet (4 mg total) by mouth every 6 (six) hours as needed for muscle spasms. (Patient not taking: Reported on 10/06/2018)  . [DISCONTINUED] amoxicillin-clavulanate (AUGMENTIN) 875-125 MG tablet Take 1 tablet by mouth 2 (two) times daily.  . [DISCONTINUED] lisinopril-hydrochlorothiazide (PRINZIDE,ZESTORETIC) 10-12.5 MG per tablet Take 1 tablet by mouth daily.   No facility-administered encounter medications on file as of 10/06/2018.     Activities of Daily Living In your present state of health, do you have any difficulty performing the following activities: 10/06/2018 07/07/2018  Hearing? N N  Vision? N N  Difficulty concentrating or making decisions? N N  Walking or climbing stairs? N N  Dressing or bathing? N N  Doing errands, shopping? N N  Preparing Food and eating ? N -  Using the Toilet? N -  In the past six months, have you accidently leaked urine? N -  Do you have problems with loss of bowel control? N -  Managing your Medications? N -  Managing your Finances? N -  Housekeeping or managing your Housekeeping? N -  Some recent data might be hidden    Patient Care Team: [name], MD as PCP - General Ladene Artist, MD as Consulting Physician (Gastroenterology) Jerrell Belfast, MD as Consulting Physician (Otolaryngology) Konrad Felix, MD as Referring Physician  (Ophthalmology)    Assessment:   This is a routine wellness examination for [place].  Exercise Activities and Dietary recommendations Type of exercise: Other - see comments(stationary bike), Time (Minutes): 15, Frequency (Times/Week): 5, Weekly Exercise (Minutes/Week): 75, Exercise limited by: None identified   Diet (meal preparation, eat out, water intake, caffeinated beverages, dairy products, fruits and vegetables): Drinks water, milk, unsweet tea.   Breakfast: coffee, eggs, granola bars, oatmeal Lunch: pb cracker Dinner:  Salad, protein, veggies  Goals    . Weight (lb) < 250 lb (113.4 kg)     Lose weight by watching diet and continuing to exercise.     . Weight (lb) < 250 lb (113.4 kg)     Lose weight        Fall Risk Fall Risk  10/06/2018 07/07/2018 03/03/2018 01/27/2018 08/12/2017  Falls in the past year? 0 0 0 No No  Number falls in past yr: 0 0 - - -  Injury with Fall? - 0 - - -  Risk for fall due to : - - - - -  Risk for fall due to: Comment - - - - -    Depression Screen PHQ 2/9 Scores 10/06/2018 07/07/2018 03/03/2018 01/27/2018  PHQ - 2 Score 0 0 0 0  PHQ- 9 Score - 0 0 0     Cognitive Function MMSE - Mini Mental State Exam 10/06/2018 02/18/2017  Orientation to time 5 5  Orientation to Place 5 5  Registration 3 3  Attention/ Calculation 5 5  Recall 3 3  Language- name 2 objects 2 2  Language- repeat 1 1  Language- follow 3 step command 3 3  Language- read & follow direction 1 1  Write a sentence 1 1  Copy design 1 1  Total score 30 30        Immunization History  Administered Date(s) Administered  . Influenza Split 02/12/2011, 04/29/2012  . Influenza, High Dose Seasonal PF 02/18/2017, 01/27/2018  . Influenza,inj,Quad PF,6+ Mos 01/26/2013, 01/05/2014, 02/14/2016  . Pneumococcal Conjugate-13 02/07/2014  . Pneumococcal Polysaccharide-23 03/17/2008, 08/13/2016  . Zoster Recombinat (Shingrix) 04/03/2017, 07/20/2017    Screening Tests Health Maintenance   Topic Date Due  . DEXA SCAN  03/12/2017  . TETANUS/TDAP  07/07/2019 (Originally 07/27/1964)  . Hepatitis C Screening  07/07/2019 (Originally [birth year])  . INFLUENZA VACCINE  11/27/2018  . MAMMOGRAM  09/06/2019  . COLONOSCOPY  04/09/2023  . PNA vac Low Risk Adult  Completed        Plan:    Bring a copy of your living will and/or healthcare power of attorney to your next office visit.  Continue doing brain stimulating activities (puzzles, reading, adult coloring books, staying active) to keep memory sharp.   I have personally reviewed and noted the following in the patient's chart:   . Medical and social history . Use of alcohol, tobacco or illicit drugs  . Current medications and supplements . Functional ability and status . Nutritional status . Physical activity . Advanced directives . List of other physicians . Hospitalizations, surgeries, and ER visits in previous 12 months . Vitals . Screenings to include cognitive, depression, and falls . Referrals and appointments  In addition, I have reviewed and discussed with patient certain preventive protocols, quality metrics, and best practice recommendations. A written personalized care plan for preventive services as well as general preventive health recommendations were provided to patient.     Gerilyn Nestle, RN  10/06/2018  Reviewed documentation provided by RN and agree w/ above.  Annye Asa, MD

## 2018-10-06 ENCOUNTER — Ambulatory Visit (INDEPENDENT_AMBULATORY_CARE_PROVIDER_SITE_OTHER): Payer: Medicare Other

## 2018-10-06 DIAGNOSIS — Z1159 Encounter for screening for other viral diseases: Secondary | ICD-10-CM | POA: Diagnosis not present

## 2018-10-06 DIAGNOSIS — Z0000 Encounter for general adult medical examination without abnormal findings: Secondary | ICD-10-CM | POA: Diagnosis not present

## 2018-10-06 DIAGNOSIS — Z23 Encounter for immunization: Secondary | ICD-10-CM

## 2018-10-06 MED ORDER — TETANUS-DIPHTH-ACELL PERTUSSIS 5-2.5-18.5 LF-MCG/0.5 IM SUSP: 0.5000 mL | Freq: Once | INTRAMUSCULAR | 0 refills | Status: AC

## 2018-10-06 NOTE — Patient Instructions (Addendum)
Bring a copy of your living will and/or healthcare power of attorney to your next office visit.  Continue doing brain stimulating activities (puzzles, reading, adult coloring books, staying active) to keep memory sharp.   Fall Prevention in the Home, Adult Falls can cause injuries. They can happen to people of all ages. There are many things you can do to make your home safe and to help prevent falls. Ask for help when making these changes, if needed. What actions can I take to prevent falls? General Instructions  Use good lighting in all rooms. Replace any light bulbs that burn out.  Turn on the lights when you go into a dark area. Use night-lights.  Keep items that you use often in easy-to-reach places. Lower the shelves around your home if necessary.  Set up your furniture so you have a clear path. Avoid moving your furniture around.  Do not have throw rugs and other things on the floor that can make you trip.  Avoid walking on wet floors.  If any of your floors are uneven, fix them.  Add color or contrast paint or tape to clearly mark and help you see: ? Any grab bars or handrails. ? First and last steps of stairways. ? Where the edge of each step is.  If you use a stepladder: ? Make sure that it is fully opened. Do not climb a closed stepladder. ? Make sure that both sides of the stepladder are locked into place. ? Ask someone to hold the stepladder for you while you use it.  If there are any pets around you, be aware of where they are. What can I do in the bathroom?      Keep the floor dry. Clean up any water that spills onto the floor as soon as it happens.  Remove soap buildup in the tub or shower regularly.  Use non-skid mats or decals on the floor of the tub or shower.  Attach bath mats securely with double-sided, non-slip rug tape.  If you need to sit down in the shower, use a plastic, non-slip stool.  Install grab bars by the toilet and in the tub and  shower. Do not use towel bars as grab bars. What can I do in the bedroom?  Make sure that you have a light by your bed that is easy to reach.  Do not use any sheets or blankets that are too big for your bed. They should not hang down onto the floor.  Have a firm chair that has side arms. You can use this for support while you get dressed. What can I do in the kitchen?  Clean up any spills right away.  If you need to reach something above you, use a strong step stool that has a grab bar.  Keep electrical cords out of the way.  Do not use floor polish or wax that makes floors slippery. If you must use wax, use non-skid floor wax. What can I do with my stairs?  Do not leave any items on the stairs.  Make sure that you have a light switch at the top of the stairs and the bottom of the stairs. If you do not have them, ask someone to add them for you.  Make sure that there are handrails on both sides of the stairs, and use them. Fix handrails that are broken or loose. Make sure that handrails are as long as the stairways.  Install non-slip stair treads on all stairs  in your home.  Avoid having throw rugs at the top or bottom of the stairs. If you do have throw rugs, attach them to the floor with carpet tape.  Choose a carpet that does not hide the edge of the steps on the stairway.  Check any carpeting to make sure that it is firmly attached to the stairs. Fix any carpet that is loose or worn. What can I do on the outside of my home?  Use bright outdoor lighting.  Regularly fix the edges of walkways and driveways and fix any cracks.  Remove anything that might make you trip as you walk through a door, such as a raised step or threshold.  Trim any bushes or trees on the path to your home.  Regularly check to see if handrails are loose or broken. Make sure that both sides of any steps have handrails.  Install guardrails along the edges of any raised decks and porches.  Clear  walking paths of anything that might make someone trip, such as tools or rocks.  Have any leaves, snow, or ice cleared regularly.  Use sand or salt on walking paths during winter.  Clean up any spills in your garage right away. This includes grease or oil spills. What other actions can I take?  Wear shoes that: ? Have a low heel. Do not wear high heels. ? Have rubber bottoms. ? Are comfortable and fit you well. ? Are closed at the toe. Do not wear open-toe sandals.  Use tools that help you move around (mobility aids) if they are needed. These include: ? Canes. ? Walkers. ? Scooters. ? Crutches.  Review your medicines with your doctor. Some medicines can make you feel dizzy. This can increase your chance of falling. Ask your doctor what other things you can do to help prevent falls. Where to find more information  Centers for Disease Control and Prevention, STEADI: https://garcia.biz/  [employer] on Aging: BrainJudge.co.uk Contact a doctor if:  You are afraid of falling at home.  You feel weak, drowsy, or dizzy at home.  You fall at home. Summary  There are many simple things that you can do to make your home safe and to help prevent falls.  Ways to make your home safe include removing tripping hazards and installing grab bars in the bathroom.  Ask for help when making these changes in your home. This information is not intended to replace advice given to you by your health care provider. Make sure you discuss any questions you have with your health care provider. Document Released: 02/08/2009 Document Revised: 11/27/2016 Document Reviewed: 11/27/2016 Elsevier Interactive Patient Education  2019 [place] Maintenance, Female Adopting a healthy lifestyle and getting preventive care can go a long way to promote health and wellness. Talk with your health care provider about what schedule of regular examinations is right for you. This is a good  chance for you to check in with your provider about disease prevention and staying healthy. In between checkups, there are plenty of things you can do on your own. Experts have done a lot of research about which lifestyle changes and preventive measures are most likely to keep you healthy. Ask your health care provider for more information. Weight and diet Eat a healthy diet  Be sure to include plenty of vegetables, fruits, low-fat dairy products, and lean protein.  Do not eat a lot of foods high in solid fats, added sugars, or salt.  Get regular exercise.  This is one of the most important things you can do for your health. ? Most adults should exercise for at least 150 minutes each week. The exercise should increase your heart rate and make you sweat (moderate-intensity exercise). ? Most adults should also do strengthening exercises at least twice a week. This is in addition to the moderate-intensity exercise. Maintain a healthy weight  Body mass index (BMI) is a measurement that can be used to identify possible weight problems. It estimates body fat based on height and weight. Your health care provider can help determine your BMI and help you achieve or maintain a healthy weight.  For females 82 years of age and older: ? A BMI below 18.5 is considered underweight. ? A BMI of 18.5 to 24.9 is normal. ? A BMI of 25 to 29.9 is considered overweight. ? A BMI of 30 and above is considered obese. Watch levels of cholesterol and blood lipids  You should start having your blood tested for lipids and cholesterol at 74 years of age, then have this test every 5 years.  You may need to have your cholesterol levels checked more often if: ? Your lipid or cholesterol levels are high. ? You are older than 74 years of age. ? You are at high risk for heart disease. Cancer screening Lung Cancer  Lung cancer screening is recommended for adults 66-70 years old who are at high risk for lung cancer because  of a history of smoking.  A yearly low-dose CT scan of the lungs is recommended for people who: ? Currently smoke. ? Have quit within the past 15 years. ? Have at least a 30-pack-year history of smoking. A pack year is smoking an average of one pack of cigarettes a day for 1 year.  Yearly screening should continue until it has been 15 years since you quit.  Yearly screening should stop if you develop a health problem that would prevent you from having lung cancer treatment. Breast Cancer  Practice breast self-awareness. This means understanding how your breasts normally appear and feel.  It also means doing regular breast self-exams. Let your health care provider know about any changes, no matter how small.  If you are in your 20s or 30s, you should have a clinical breast exam (CBE) by a health care provider every 1-3 years as part of a regular health exam.  If you are 67 or older, have a CBE every year. Also consider having a breast X-ray (mammogram) every year.  If you have a family history of breast cancer, talk to your health care provider about genetic screening.  If you are at high risk for breast cancer, talk to your health care provider about having an MRI and a mammogram every year.  Breast cancer gene (BRCA) assessment is recommended for women who have family members with BRCA-related cancers. BRCA-related cancers include: ? Breast. ? Ovarian. ? Tubal. ? Peritoneal cancers.  Results of the assessment will determine the need for genetic counseling and BRCA1 and BRCA2 testing. Cervical Cancer Your health care provider may recommend that you be screened regularly for cancer of the pelvic organs (ovaries, uterus, and vagina). This screening involves a pelvic examination, including checking for microscopic changes to the surface of your cervix (Pap test). You may be encouraged to have this screening done every 3 years, beginning at age 67.  For women ages 28-65, health care  providers may recommend pelvic exams and Pap testing every 3 years, or they may recommend  the Pap and pelvic exam, combined with testing for human papilloma virus (HPV), every 5 years. Some types of HPV increase your risk of cervical cancer. Testing for HPV may also be done on women of any age with unclear Pap test results.  Other health care providers may not recommend any screening for nonpregnant women who are considered low risk for pelvic cancer and who do not have symptoms. Ask your health care provider if a screening pelvic exam is right for you.  If you have had past treatment for cervical cancer or a condition that could lead to cancer, you need Pap tests and screening for cancer for at least 20 years after your treatment. If Pap tests have been discontinued, your risk factors (such as having a new sexual partner) need to be reassessed to determine if screening should resume. Some women have medical problems that increase the chance of getting cervical cancer. In these cases, your health care provider may recommend more frequent screening and Pap tests. Colorectal Cancer  This type of cancer can be detected and often prevented.  Routine colorectal cancer screening usually begins at 74 years of age and continues through 74 years of age.  Your health care provider may recommend screening at an earlier age if you have risk factors for colon cancer.  Your health care provider may also recommend using home test kits to check for hidden blood in the stool.  A small camera at the end of a tube can be used to examine your colon directly (sigmoidoscopy or colonoscopy). This is done to check for the earliest forms of colorectal cancer.  Routine screening usually begins at age 6.  Direct examination of the colon should be repeated every 5-10 years through 74 years of age. However, you may need to be screened more often if early forms of precancerous polyps or small growths are found. Skin Cancer   Check your skin from head to toe regularly.  Tell your health care provider about any new moles or changes in moles, especially if there is a change in a mole's shape or color.  Also tell your health care provider if you have a mole that is larger than the size of a pencil eraser.  Always use sunscreen. Apply sunscreen liberally and repeatedly throughout the day.  Protect yourself by wearing long sleeves, pants, a wide-brimmed hat, and sunglasses whenever you are outside. Heart disease, diabetes, and high blood pressure  High blood pressure causes heart disease and increases the risk of stroke. High blood pressure is more likely to develop in: ? People who have blood pressure in the high end of the normal range (130-139/85-89 mm Hg). ? People who are overweight or obese. ? People who are African American.  If you are 69-81 years of age, have your blood pressure checked every 3-5 years. If you are 69 years of age or older, have your blood pressure checked every year. You should have your blood pressure measured twice-once when you are at a hospital or clinic, and once when you are not at a hospital or clinic. Record the average of the two measurements. To check your blood pressure when you are not at a hospital or clinic, you can use: ? An automated blood pressure machine at a pharmacy. ? A home blood pressure monitor.  If you are between 82 years and 53 years old, ask your health care provider if you should take aspirin to prevent strokes.  Have regular diabetes screenings. This involves taking  a blood sample to check your fasting blood sugar level. ? If you are at a normal weight and have a low risk for diabetes, have this test once every three years after 74 years of age. ? If you are overweight and have a high risk for diabetes, consider being tested at a younger age or more often. Preventing infection Hepatitis B  If you have a higher risk for hepatitis B, you should be screened  for this virus. You are considered at high risk for hepatitis B if: ? You were born in a country where hepatitis B is common. Ask your health care provider which countries are considered high risk. ? Your parents were born in a high-risk country, and you have not been immunized against hepatitis B (hepatitis B vaccine). ? You have HIV or AIDS. ? You use needles to inject street drugs. ? You live with someone who has hepatitis B. ? You have had sex with someone who has hepatitis B. ? You get hemodialysis treatment. ? You take certain medicines for conditions, including cancer, organ transplantation, and autoimmune conditions. Hepatitis C  Blood testing is recommended for: ? Everyone born from [birth year] through 1966. ? Anyone with known risk factors for hepatitis C. Sexually transmitted infections (STIs)  You should be screened for sexually transmitted infections (STIs) including gonorrhea and chlamydia if: ? You are sexually active and are younger than 74 years of age. ? You are older than 74 years of age and your health care provider tells you that you are at risk for this type of infection. ? Your sexual activity has changed since you were last screened and you are at an increased risk for chlamydia or gonorrhea. Ask your health care provider if you are at risk.  If you do not have HIV, but are at risk, it may be recommended that you take a prescription medicine daily to prevent HIV infection. This is called pre-exposure prophylaxis (PrEP). You are considered at risk if: ? You are sexually active and do not regularly use condoms or know the HIV status of your partner(s). ? You take drugs by injection. ? You are sexually active with a partner who has HIV. Talk with your health care provider about whether you are at high risk of being infected with HIV. If you choose to begin PrEP, you should first be tested for HIV. You should then be tested every 3 months for as long as you are taking PrEP.  Pregnancy  If you are premenopausal and you may become pregnant, ask your health care provider about preconception counseling.  If you may become pregnant, take 400 to 800 micrograms (mcg) of folic acid every day.  If you want to prevent pregnancy, talk to your health care provider about birth control (contraception). Osteoporosis and menopause  Osteoporosis is a disease in which the bones lose minerals and strength with aging. This can result in serious bone fractures. Your risk for osteoporosis can be identified using a bone density scan.  If you are 55 years of age or older, or if you are at risk for osteoporosis and fractures, ask your health care provider if you should be screened.  Ask your health care provider whether you should take a calcium or vitamin D supplement to lower your risk for osteoporosis.  Menopause may have certain physical symptoms and risks.  Hormone replacement therapy may reduce some of these symptoms and risks. Talk to your health care provider about whether hormone replacement therapy is right  for you. Follow these instructions at home:  Schedule regular health, dental, and eye exams.  Stay current with your immunizations.  Do not use any tobacco products including cigarettes, chewing tobacco, or electronic cigarettes.  If you are pregnant, do not drink alcohol.  If you are breastfeeding, limit how much and how often you drink alcohol.  Limit alcohol intake to no more than 1 drink per day for nonpregnant women. One drink equals 12 ounces of beer, 5 ounces of wine, or 1 ounces of hard liquor.  Do not use street drugs.  Do not share needles.  Ask your health care provider for help if you need support or information about quitting drugs.  Tell your health care provider if you often feel depressed.  Tell your health care provider if you have ever been abused or do not feel safe at home. This information is not intended to replace advice given to  you by your health care provider. Make sure you discuss any questions you have with your health care provider. Document Released: 10/28/2010 Document Revised: 09/20/2015 Document Reviewed: 01/16/2015 Elsevier Interactive Patient Education  2019 [employer].

## 2018-10-06 NOTE — Progress Notes (Signed)
  I connected with patient 10/06/18 at 10:00 AM EDT by a video/audio enabled telemedicine application and verified that I am speaking with the correct person using two identifiers. Patient stated full name and DOB. Patient gave permission to continue with virtual visit. Patient's location was at home and Nurse's location was at [place] office.

## 2018-10-27 ENCOUNTER — Other Ambulatory Visit: Payer: Self-pay | Admitting: Family Medicine

## 2018-12-02 DIAGNOSIS — H2640 Unspecified secondary cataract: Secondary | ICD-10-CM | POA: Diagnosis not present

## 2018-12-02 DIAGNOSIS — H04123 Dry eye syndrome of bilateral lacrimal glands: Secondary | ICD-10-CM | POA: Diagnosis not present

## 2018-12-06 ENCOUNTER — Encounter: Payer: Self-pay | Admitting: Physician Assistant

## 2018-12-06 ENCOUNTER — Other Ambulatory Visit: Payer: Self-pay

## 2018-12-06 ENCOUNTER — Ambulatory Visit (INDEPENDENT_AMBULATORY_CARE_PROVIDER_SITE_OTHER): Payer: Medicare Other | Admitting: Physician Assistant

## 2018-12-06 DIAGNOSIS — H6981 Other specified disorders of Eustachian tube, right ear: Secondary | ICD-10-CM | POA: Diagnosis not present

## 2018-12-06 DIAGNOSIS — H6690 Otitis media, unspecified, unspecified ear: Secondary | ICD-10-CM

## 2018-12-06 MED ORDER — AMOXICILLIN 875 MG PO TABS: 875.0000 mg | ORAL_TABLET | Freq: Two times a day (BID) | ORAL | 0 refills | Status: DC

## 2018-12-06 NOTE — Patient Instructions (Signed)
Instructions sent to MyChart.  Please keep hydrated. Start a daily non-drowsy Claritin over the counter. Restart your Flonase taking every day for the next 2 weeks.  I have sent an antibiotic in to the pharmacy for you to take as directed. If symptoms are not resolving or if there is any further recurrence of symptoms, we will want to assess in office or have you evaluated by ENT.

## 2018-12-06 NOTE — Progress Notes (Signed)
I have discussed the procedure for the virtual visit with the patient who has given consent to proceed with assessment and treatment.   Alyas Creary S Sloane Junkin, CMA     

## 2018-12-06 NOTE — Progress Notes (Signed)
Virtual Visit via Video   I connected with patient on 12/06/18 at 11:00 AM EDT by a video enabled telemedicine application and verified that I am speaking with the correct person using two identifiers.  Location patient: Home Location provider: [facility] Office Persons participating in the virtual visit: Patient, Provider, [place] (Patina Moore)  I discussed the limitations of evaluation and management by telemedicine and the availability of in person appointments. The patient expressed understanding and agreed to proceed.  Subjective:   HPI:   Patient presents via [facility] c/o pain in R ear x 7 days. Patient describes pain is intermittent and is a pressure sensation. Notes feeling like the ear needs to pop. Some occasional ringing the ear. Denies dizziness. Denies change in hearing with this. Denies drainage from the ear. Denies swimming. Denies fever, chills, sinus pressure or pain. Notes nasal congestion chronically from her allergies. Is taking OTC antihistamine.   ROS:   See pertinent positives and negatives per HPI.  Patient Active Problem List   Diagnosis Date Noted  . Cyst of finger 10/03/2015  . Severe obesity (BMI >= 40) ([place]) 12/07/2013  . Ventral hernia 02/25/2013  . Paresthesia of left arm 09/23/2012  . Generalized anxiety disorder 09/23/2012  . General medical examination 02/12/2011  . Back pain 08/27/2010  . ORTHOSTATIC HYPOTENSION 06/28/2010  . VERTIGO 06/28/2010  . Hyperlipidemia 05/06/2010  . Essential hypertension 05/06/2010  . RHINITIS 05/06/2010    Social History   Tobacco Use  . Smoking status: Never Smoker  . Smokeless tobacco: Never Used  Substance Use Topics  . Alcohol use: Yes    Comment: rarely    Current Outpatient Medications:  .  aspirin 81 MG tablet, Take 81 mg by mouth daily.  , Disp: , Rfl:  .  hydrochlorothiazide (HYDRODIURIL) 12.5 MG tablet, Take 1 tablet (12.5 mg total) by mouth daily., Disp: 90 tablet, Rfl: 1 .  losartan  (COZAAR) 50 MG tablet, Take 50 mg by mouth daily., Disp: , Rfl:  .  losartan-hydrochlorothiazide (HYZAAR) 50-12.5 MG tablet, Take 1 tablet by mouth daily., Disp: 90 tablet, Rfl: 1 .  Multiple Vitamin (MULTIVITAMIN) tablet, Take 1 tablet by mouth daily.  , Disp: , Rfl:  .  Pantoprazole Sodium (PROTONIX PO), Take by mouth 3 times/day as needed-between meals & bedtime., Disp: , Rfl:  .  pravastatin (PRAVACHOL) 40 MG tablet, TAKE 1 TABLET(40 MG) BY MOUTH DAILY, Disp: 90 tablet, Rfl: 1 .  tiZANidine (ZANAFLEX) 4 MG tablet, Take 1 tablet (4 mg total) by mouth every 6 (six) hours as needed for muscle spasms., Disp: 30 tablet, Rfl: 0 .  amoxicillin (AMOXIL) 875 MG tablet, Take 1 tablet (875 mg total) by mouth 2 (two) times daily., Disp: 20 tablet, Rfl: 0  Allergies  Allergen Reactions  . Iodine Hives  . Shellfish Allergy Hives    Objective:   There were no vitals taken for this visit.  Patient is well-developed, well-nourished in no acute distress.  Resting comfortably at home.  Head is normocephalic, atraumatic.  No labored breathing.  Speech is clear and coherent with logical contest.  Patient is alert and oriented at baseline.   Assessment and Plan:    1. Dysfunction of right eustachian tube 2. Acute otitis media, unspecified otitis media type Start daily Claritin. Resume Flonase taking daily. Rx Amoxicillin for suspected mild AOM 2/2 ETD. Follow-up in office if not resolving.   - amoxicillin (AMOXIL) 875 MG tablet; Take 1 tablet (875 mg total) by mouth 2 (  two) times daily.  Dispense: 20 tablet; Refill: 0 .   Leeanne Rio, PA-C 12/06/2018

## 2018-12-16 DIAGNOSIS — H26492 Other secondary cataract, left eye: Secondary | ICD-10-CM | POA: Diagnosis not present

## 2018-12-31 ENCOUNTER — Encounter: Payer: Self-pay | Admitting: Family Medicine

## 2018-12-31 ENCOUNTER — Ambulatory Visit (INDEPENDENT_AMBULATORY_CARE_PROVIDER_SITE_OTHER): Payer: Medicare Other | Admitting: Family Medicine

## 2018-12-31 ENCOUNTER — Other Ambulatory Visit: Payer: Self-pay

## 2018-12-31 VITALS — BP 126/78 | HR 66 | Temp 98.6°F | Ht 68.0 in | Wt 272.0 lb

## 2018-12-31 DIAGNOSIS — Z23 Encounter for immunization: Secondary | ICD-10-CM

## 2018-12-31 DIAGNOSIS — E785 Hyperlipidemia, unspecified: Secondary | ICD-10-CM

## 2018-12-31 DIAGNOSIS — I10 Essential (primary) hypertension: Secondary | ICD-10-CM | POA: Diagnosis not present

## 2018-12-31 LAB — LIPID PANEL
Cholesterol: 136 mg/dL (ref 0–200)
HDL: 46.6 mg/dL (ref >39.00)
LDL Cholesterol: 67 mg/dL (ref 0–99)
NonHDL: 89.25
Total CHOL/HDL Ratio: 3
Triglycerides: 110 mg/dL (ref 0.0–149.0)
VLDL: 22 mg/dL (ref 0.0–40.0)

## 2018-12-31 LAB — HEPATIC FUNCTION PANEL
ALT: 23 U/L (ref 0–35)
AST: 23 U/L (ref 0–37)
Albumin: 4.2 g/dL (ref 3.5–5.2)
Alkaline Phosphatase: 69 U/L (ref 39–117)
Bilirubin, Direct: 0.2 mg/dL (ref 0.0–0.3)
Total Bilirubin: 1.5 mg/dL — ABNORMAL HIGH (ref 0.2–1.2)
Total Protein: 6.9 g/dL (ref 6.0–8.3)

## 2018-12-31 LAB — CBC WITH DIFFERENTIAL/PLATELET
Basophils Absolute: 0 10*3/uL (ref 0.0–0.1)
Basophils Relative: 0.7 % (ref 0.0–3.0)
Eosinophils Absolute: 0.1 10*3/uL (ref 0.0–0.7)
Eosinophils Relative: 2.1 % (ref 0.0–5.0)
HCT: 41.5 % (ref 36.0–46.0)
Hemoglobin: 14.2 g/dL (ref 12.0–15.0)
Lymphocytes Relative: 32.4 % (ref 12.0–46.0)
Lymphs Abs: 1.1 10*3/uL (ref 0.7–4.0)
MCHC: 34.3 g/dL (ref 30.0–36.0)
MCV: 98.1 fl (ref 78.0–100.0)
Monocytes Absolute: 0.4 10*3/uL (ref 0.1–1.0)
Monocytes Relative: 12.1 % — ABNORMAL HIGH (ref 3.0–12.0)
Neutro Abs: 1.7 10*3/uL (ref 1.4–7.7)
Neutrophils Relative %: 52.7 % (ref 43.0–77.0)
Platelets: 252 10*3/uL (ref 150.0–400.0)
RBC: 4.23 Mil/uL (ref 3.87–5.11)
RDW: 13.7 % (ref 11.5–15.5)
WBC: 3.3 10*3/uL — ABNORMAL LOW (ref 4.0–10.5)

## 2018-12-31 LAB — BASIC METABOLIC PANEL
BUN: 17 mg/dL (ref 6–23)
CO2: 30 mEq/L (ref 19–32)
Calcium: 10 mg/dL (ref 8.4–10.5)
Chloride: 102 mEq/L (ref 96–112)
Creatinine, Ser: 0.76 mg/dL (ref 0.40–1.20)
GFR: 74.31 mL/min (ref >60.00)
Glucose, Bld: 92 mg/dL (ref 70–99)
Potassium: 4.2 mEq/L (ref 3.5–5.1)
Sodium: 139 mEq/L (ref 135–145)

## 2018-12-31 LAB — TSH: TSH: 2.92 u[IU]/mL (ref 0.35–4.50)

## 2018-12-31 MED ORDER — HYDROCHLOROTHIAZIDE 12.5 MG PO TABS: ORAL_TABLET | ORAL | 1 refills | Status: DC

## 2018-12-31 MED ORDER — TETANUS-DIPHTH-ACELL PERTUSSIS 5-2.5-18.5 LF-MCG/0.5 IM SUSP: 0.5000 mL | Freq: Once | INTRAMUSCULAR | 0 refills | Status: AC

## 2018-12-31 NOTE — Assessment & Plan Note (Signed)
Chronic problem.  Tolerating statin w/o difficulty.  Check labs.  Adjust meds prn  

## 2018-12-31 NOTE — Assessment & Plan Note (Signed)
Chronic problem.  Well controlled.  Currently asymptomatic w/ exception of intermittent swelling which improves w/ extra dose of HCTZ.  Check labs.  No anticipated med changes.  Will follow.

## 2018-12-31 NOTE — Patient Instructions (Addendum)
Schedule your complete physical in 6 months We'll notify you of your lab results and make any changes if needed Continue to work on healthy diet and regular exercise- you're doing it!! Continue to add an HCTZ if you have swelling Drink plenty of water I sent the Tdap to the Pharmacy Call with any questions or concerns Stay Safe!

## 2018-12-31 NOTE — Assessment & Plan Note (Signed)
Ongoing issue for pt.  Stressed need for healthy diet and addition to regular exercise.  Will continue to follow.

## 2018-12-31 NOTE — Progress Notes (Signed)
   Subjective:    Patient ID: Shelby Reeves, female    DOB: 1946-02-14, 73 y.o.   MRN: [id number]  HPI HTN- chronic problem, on HCTZ 12.5mg , Losartan 50mg  daily w/ good control.  No CP, SOB, HAs, visual changes, edema (periodically will take extra HCTZ if legs swell).  Hyperlipidemia- chronic problem, on Pravastatin 40mg  daily.  Exercising regularly- riding bike 15-20 minutes/day.  Denies abd pain, N/V  Obesity- ongoing issue.  Weight is fairly stable.  Riding bike x3 months now.  Not following a particular diet.   Review of Systems For ROS see HPI     Objective:   Physical Exam Vitals signs reviewed.  Constitutional:      General: She is not in acute distress.    Appearance: She is well-developed. She is obese.  HENT:     Head: Normocephalic and atraumatic.  Eyes:     Conjunctiva/sclera: Conjunctivae normal.     Pupils: Pupils are equal, round, and reactive to light.  Neck:     Musculoskeletal: Normal range of motion and neck supple.     Thyroid: No thyromegaly.  Cardiovascular:     Rate and Rhythm: Normal rate and regular rhythm.     Heart sounds: Normal heart sounds. No murmur.  Pulmonary:     Effort: Pulmonary effort is normal. No respiratory distress.     Breath sounds: Normal breath sounds.  Abdominal:     General: There is no distension.     Palpations: Abdomen is soft.     Tenderness: There is no abdominal tenderness.  Lymphadenopathy:     Cervical: No cervical adenopathy.  Skin:    General: Skin is warm and dry.  Neurological:     Mental Status: She is alert and oriented to person, place, and time.  Psychiatric:        Behavior: Behavior normal.           Assessment & Plan:

## 2019-01-05 ENCOUNTER — Ambulatory Visit: Payer: Medicare Other | Admitting: Family Medicine

## 2019-01-23 ENCOUNTER — Encounter: Payer: Self-pay | Admitting: Family Medicine

## 2019-01-24 ENCOUNTER — Other Ambulatory Visit: Payer: Self-pay

## 2019-01-24 MED ORDER — LOSARTAN POTASSIUM 50 MG PO TABS: 50.0000 mg | ORAL_TABLET | Freq: Every day | ORAL | 0 refills | Status: DC

## 2019-02-24 ENCOUNTER — Encounter: Payer: Self-pay | Admitting: General Practice

## 2019-03-18 ENCOUNTER — Other Ambulatory Visit: Payer: Self-pay | Admitting: *Deleted

## 2019-03-18 MED ORDER — PANTOPRAZOLE SODIUM 40 MG PO TBEC: 40.0000 mg | DELAYED_RELEASE_TABLET | Freq: Two times a day (BID) | ORAL | 1 refills | Status: DC | PRN

## 2019-03-21 ENCOUNTER — Telehealth: Payer: Self-pay

## 2019-03-21 NOTE — Telephone Encounter (Signed)
Patient called in stating that the pharmacy told her that insurance will not cover her [place] script. Patient stated that this is the best medication for her. Informed patient that I would route message to PCP. Please advise

## 2019-03-21 NOTE — Telephone Encounter (Signed)
Can we initiate a prior authorization?

## 2019-03-21 NOTE — Telephone Encounter (Signed)
PA began in covermymeds 

## 2019-03-22 ENCOUNTER — Other Ambulatory Visit: Payer: Self-pay | Admitting: General Practice

## 2019-03-22 MED ORDER — HYDROCHLOROTHIAZIDE 12.5 MG PO TABS: ORAL_TABLET | ORAL | 1 refills | Status: DC

## 2019-03-22 NOTE — Telephone Encounter (Signed)
Patient is aware that a prior auth has been sent to covermymeds.

## 2019-03-31 ENCOUNTER — Telehealth: Payer: Self-pay

## 2019-03-31 MED ORDER — PANTOPRAZOLE SODIUM 40 MG PO TBEC: 40.0000 mg | DELAYED_RELEASE_TABLET | Freq: Every day | ORAL | 1 refills | Status: DC

## 2019-03-31 NOTE — Telephone Encounter (Signed)
Ok to send in a Rx for 1 tablet daily

## 2019-03-31 NOTE — Telephone Encounter (Signed)
Medication filled to pharmacy as requested.   

## 2019-03-31 NOTE — Addendum Note (Signed)
Addended by: Davis Gourd on: 03/31/2019 01:24 PM   Modules accepted: Orders

## 2019-03-31 NOTE — Telephone Encounter (Signed)
Patient has been notified that her PA for PROTONIX was denied. Stated that she is only taking once daily if needed. Informed patient that it was sent in saying that she was taking it 3 times a day. Informed patient that we will send in a new PA thru covermymeds.

## 2019-03-31 NOTE — Telephone Encounter (Signed)
Yes, please change script to 1 tab po daily, #90

## 2019-04-26 ENCOUNTER — Other Ambulatory Visit: Payer: Self-pay | Admitting: *Deleted

## 2019-04-26 MED ORDER — LOSARTAN POTASSIUM 50 MG PO TABS: 50.0000 mg | ORAL_TABLET | Freq: Every day | ORAL | 0 refills | Status: DC

## 2019-05-14 ENCOUNTER — Encounter: Payer: Self-pay | Admitting: Family Medicine

## 2019-05-18 ENCOUNTER — Encounter: Payer: Self-pay | Admitting: Family Medicine

## 2019-05-19 ENCOUNTER — Other Ambulatory Visit: Payer: Self-pay

## 2019-05-19 ENCOUNTER — Other Ambulatory Visit: Payer: Self-pay | Admitting: General Practice

## 2019-05-19 MED ORDER — PRAVASTATIN SODIUM 40 MG PO TABS: ORAL_TABLET | ORAL | 1 refills | Status: DC

## 2019-05-19 MED ORDER — LOSARTAN POTASSIUM 50 MG PO TABS: 50.0000 mg | ORAL_TABLET | Freq: Every day | ORAL | 0 refills | Status: DC

## 2019-05-20 ENCOUNTER — Ambulatory Visit: Payer: Medicare Other | Attending: Internal Medicine

## 2019-05-20 DIAGNOSIS — Z23 Encounter for immunization: Secondary | ICD-10-CM | POA: Insufficient documentation

## 2019-05-20 NOTE — Progress Notes (Signed)
   Covid-19 Vaccination Clinic  Name:  Shelby Reeves    MRN: [id number] DOB: [birth year]  05/20/2019  [name] was observed post Covid-19 immunization for 15 minutes without incidence. She was provided with Vaccine Information Sheet and instruction to access the V-Safe system.   [name] was instructed to call 911 with any severe reactions post vaccine: Marland Kitchen Difficulty breathing  . Swelling of your face and throat  . A fast heartbeat  . A bad rash all over your body  . Dizziness and weakness    Immunizations Administered    Name Date Dose VIS Date Route   Pfizer COVID-19 Vaccine 05/20/2019 10:38 AM 0.3 mL 04/08/2019 Intramuscular   Manufacturer: [employer]   Lot: [id number]   [place]: [id number]

## 2019-06-08 ENCOUNTER — Ambulatory Visit: Payer: Medicare Other | Attending: Internal Medicine

## 2019-06-08 DIAGNOSIS — Z23 Encounter for immunization: Secondary | ICD-10-CM

## 2019-06-08 NOTE — Progress Notes (Signed)
   Covid-19 Vaccination Clinic  Name:  Shelby Reeves    MRN: [id number] DOB: January 08, 1946  06/08/2019  [name] was observed post Covid-19 immunization for 15 minutes without incidence. She was provided with Vaccine Information Sheet and instruction to access the V-Safe system.   Ms. Heart was instructed to call 911 with any severe reactions post vaccine: Marland Kitchen Difficulty breathing  . Swelling of your face and throat  . A fast heartbeat  . A bad rash all over your body  . Dizziness and weakness    Immunizations Administered    Name Date Dose VIS Date Route   Pfizer COVID-19 Vaccine 06/08/2019 10:38 AM 0.3 mL 04/08/2019 Intramuscular   Manufacturer: [employer], [employer]   Lot: [id number]   [place]: [id number]

## 2019-07-08 ENCOUNTER — Other Ambulatory Visit: Payer: Self-pay

## 2019-07-11 ENCOUNTER — Encounter: Payer: Self-pay | Admitting: Family Medicine

## 2019-07-11 ENCOUNTER — Ambulatory Visit (INDEPENDENT_AMBULATORY_CARE_PROVIDER_SITE_OTHER): Payer: Medicare Other | Admitting: Family Medicine

## 2019-07-11 ENCOUNTER — Other Ambulatory Visit: Payer: Self-pay

## 2019-07-11 VITALS — BP 125/76 | HR 65 | Temp 97.6°F | Resp 16 | Ht 68.0 in | Wt 279.4 lb

## 2019-07-11 DIAGNOSIS — Z0000 Encounter for general adult medical examination without abnormal findings: Secondary | ICD-10-CM

## 2019-07-11 LAB — BASIC METABOLIC PANEL
BUN: 14 mg/dL (ref 6–23)
CO2: 25 mEq/L (ref 19–32)
Calcium: 10 mg/dL (ref 8.4–10.5)
Chloride: 104 mEq/L (ref 96–112)
Creatinine, Ser: 0.66 mg/dL (ref 0.40–1.20)
GFR: 87.32 mL/min (ref >60.00)
Glucose, Bld: 94 mg/dL (ref 70–99)
Potassium: 3.7 mEq/L (ref 3.5–5.1)
Sodium: 137 mEq/L (ref 135–145)

## 2019-07-11 LAB — LIPID PANEL
Cholesterol: 122 mg/dL (ref 0–200)
HDL: 48.3 mg/dL (ref >39.00)
LDL Cholesterol: 53 mg/dL (ref 0–99)
NonHDL: 73.27
Total CHOL/HDL Ratio: 3
Triglycerides: 102 mg/dL (ref 0.0–149.0)
VLDL: 20.4 mg/dL (ref 0.0–40.0)

## 2019-07-11 LAB — CBC WITH DIFFERENTIAL/PLATELET
Basophils Absolute: 0 10*3/uL (ref 0.0–0.1)
Basophils Relative: 0.7 % (ref 0.0–3.0)
Eosinophils Absolute: 0.1 10*3/uL (ref 0.0–0.7)
Eosinophils Relative: 2.5 % (ref 0.0–5.0)
HCT: 40.2 % (ref 36.0–46.0)
Hemoglobin: 14.1 g/dL (ref 12.0–15.0)
Lymphocytes Relative: 29.9 % (ref 12.0–46.0)
Lymphs Abs: 1 10*3/uL (ref 0.7–4.0)
MCHC: 35 g/dL (ref 30.0–36.0)
MCV: 96.7 fl (ref 78.0–100.0)
Monocytes Absolute: 0.3 10*3/uL (ref 0.1–1.0)
Monocytes Relative: 10 % (ref 3.0–12.0)
Neutro Abs: 1.9 10*3/uL (ref 1.4–7.7)
Neutrophils Relative %: 56.9 % (ref 43.0–77.0)
Platelets: 224 10*3/uL (ref 150.0–400.0)
RBC: 4.16 Mil/uL (ref 3.87–5.11)
RDW: 13.6 % (ref 11.5–15.5)
WBC: 3.4 10*3/uL — ABNORMAL LOW (ref 4.0–10.5)

## 2019-07-11 LAB — HEPATIC FUNCTION PANEL
ALT: 24 U/L (ref 0–35)
AST: 22 U/L (ref 0–37)
Albumin: 4.1 g/dL (ref 3.5–5.2)
Alkaline Phosphatase: 71 U/L (ref 39–117)
Bilirubin, Direct: 0.2 mg/dL (ref 0.0–0.3)
Total Bilirubin: 1.3 mg/dL — ABNORMAL HIGH (ref 0.2–1.2)
Total Protein: 6.9 g/dL (ref 6.0–8.3)

## 2019-07-11 LAB — TSH: TSH: 4.27 u[IU]/mL (ref 0.35–4.50)

## 2019-07-11 MED ORDER — PHENTERMINE HCL 37.5 MG PO CAPS: 37.5000 mg | ORAL_CAPSULE | ORAL | 0 refills | Status: DC

## 2019-07-11 NOTE — Assessment & Plan Note (Signed)
PE unchanged from previous.  UTD on colonoscopy, mammo, immunizations.  Check labs.  Anticipatory guidance provided.

## 2019-07-11 NOTE — Progress Notes (Signed)
   Subjective:    Patient ID: Shelby Reeves, female    DOB: [birth year], 74 y.o.   MRN: [id number]  HPI CPE- UTD on colonoscopy, mammo, immunizations.  Pt has gained 7 lbs since last visit.  Pt is interested in restarting 'diet pills'   Review of Systems Patient reports no vision/ hearing changes, adenopathy,fever, persistant/recurrent hoarseness, swallowing issues, chest pain, palpitations, edema, persistant/recurrent cough, hemoptysis, dyspnea (rest/exertional/paroxysmal nocturnal), gastrointestinal bleeding (melena, rectal bleeding), abdominal pain, significant heartburn, bowel changes, GU symptoms (dysuria, hematuria, incontinence), Gyn symptoms (abnormal  bleeding, pain),  syncope, focal weakness, memory loss, numbness & tingling, skin/hair/nail changes, abnormal bruising or bleeding, anxiety, or depression.   This visit occurred during the SARS-CoV-2 public health emergency.  Safety protocols were in place, including screening questions prior to the visit, additional usage of staff PPE, and extensive cleaning of exam room while observing appropriate contact time as indicated for disinfecting solutions.       Objective:   Physical Exam General Appearance:    Alert, cooperative, no distress, appears stated age, obese  Head:    Normocephalic, without obvious abnormality, atraumatic  Eyes:    PERRL, conjunctiva/corneas clear, EOM's intact, fundi    benign, both eyes  Ears:    Normal TM's and external ear canals, both ears  Nose:   Nares normal, septum midline, mucosa normal, no drainage    or sinus tenderness  Throat:   Lips, mucosa, and tongue normal; teeth and gums normal  Neck:   Supple, symmetrical, trachea midline, no adenopathy;    Thyroid: known R sided nodule  Back:     Symmetric, no curvature, ROM normal, no CVA tenderness  Lungs:     Clear to auscultation bilaterally, respirations unlabored  Chest Wall:    No tenderness or deformity   Heart:    Regular rate and rhythm, S1 and  S2 normal, no murmur, rub   or gallop  Breast Exam:    Deferred to mammo  Abdomen:     Soft, non-tender, bowel sounds active all four quadrants,    no masses, no organomegaly  Genitalia:    Deferred  Rectal:    Extremities:   Extremities normal, atraumatic, no cyanosis or edema  Pulses:   2+ and symmetric all extremities  Skin:   Skin color, texture, turgor normal, no rashes or lesions  Lymph nodes:   Cervical, supraclavicular, and axillary nodes normal  Neurologic:   CNII-XII intact, normal strength, sensation and reflexes    throughout          Assessment & Plan:

## 2019-07-11 NOTE — Patient Instructions (Signed)
Follow up in 6 months to recheck BP, cholesterol, and weight loss We'll notify you of your lab results and make any changes if needed Continue to work on healthy diet and regular exercise- you can do it! Restart the phentermine daily Call with any questions or concerns Stay Safe!  Stay Healthy!

## 2019-07-11 NOTE — Assessment & Plan Note (Signed)
Deteriorated.  Pt has gained 7 lbs since last visit.  Stressed need for healthy diet and regular exercise.  Will restart phentermine at pt's request and follow closely.

## 2019-07-12 ENCOUNTER — Encounter: Payer: Self-pay | Admitting: General Practice

## 2019-07-18 ENCOUNTER — Telehealth: Payer: Self-pay | Admitting: Family Medicine

## 2019-07-18 NOTE — Chronic Care Management (AMB) (Signed)
°  Chronic Care Management   Note  07/18/2019 Name: Shelby Reeves MRN: [id number] DOB: Sep 16, 1945  Shelby Reeves is a 74 y.o. year old female who is a primary care patient of Birdie Riddle, Aundra Millet, MD. I reached out to Magdalen Spatz by phone today in response to a referral sent by Ms. Christean Grief Archibald's PCP, [name], MD.   Ms. Burcher was given information about Chronic Care Management services today including:  1. CCM service includes personalized support from designated clinical staff supervised by her physician, including individualized plan of care and coordination with other care providers 2. 24/7 contact phone numbers for assistance for urgent and routine care needs. 3. Service will only be billed when office clinical staff spend 20 minutes or more in a month to coordinate care. 4. Only one practitioner may furnish and bill the service in a calendar month. 5. The patient may stop CCM services at any time (effective at the end of the month) by phone call to the office staff.   Patient agreed to services and verbal consent obtained.   Follow up plan:   [place]

## 2019-07-21 ENCOUNTER — Ambulatory Visit: Payer: Medicare Other

## 2019-07-21 DIAGNOSIS — E785 Hyperlipidemia, unspecified: Secondary | ICD-10-CM

## 2019-07-21 DIAGNOSIS — I10 Essential (primary) hypertension: Secondary | ICD-10-CM

## 2019-07-21 NOTE — Progress Notes (Signed)
Chronic Care Management Pharmacy  Name: Shelby Reeves  MRN: [id number] DOB: October 12, 1945  Chief Complaint/ HPI  Shelby Reeves,  74 y.o. , female presents for their Initial CCM visit with the clinical pharmacist via telephone due to COVID-19 Pandemic.  PCP : [name], MD  Their chronic conditions include: hyperlipidemia, HTN, obesity   Office Visits: -3/15 (PCP): 7 lb weight gain since 9/4 OV. Restarted on phentermine 37.5 every morning -9/4 (PCP): flu vaccine administered, Tdap sent to pharmacy   Medications: Outpatient Encounter Medications as of 07/21/2019  Medication Sig  . aspirin 81 MG tablet Take 81 mg by mouth daily.    . hydrochlorothiazide (HYDRODIURIL) 12.5 MG tablet 1-2 tablets daily depending on swelling  . losartan (COZAAR) 50 MG tablet Take 1 tablet (50 mg total) by mouth daily.  . Melatonin 5 MG CAPS Take by mouth.  . Multiple Vitamin (MULTIVITAMIN) tablet Take 1 tablet by mouth daily.    . pantoprazole (PROTONIX) 40 MG tablet Take 1 tablet (40 mg total) by mouth daily.  . phentermine 37.5 MG capsule Take 1 capsule (37.5 mg total) by mouth every morning.  . pravastatin (PRAVACHOL) 40 MG tablet TAKE 1 TABLET(40 MG) BY MOUTH DAILY  . neomycin-polymyxin b-dexamethasone (MAXITROL) 3.5-10000-0.1 OINT APPLY 1 APPLICATION TO OD BID  . [DISCONTINUED] lisinopril-hydrochlorothiazide (PRINZIDE,ZESTORETIC) 10-12.5 MG per tablet Take 1 tablet by mouth daily.   No facility-administered encounter medications on file as of 07/21/2019.    Current Diagnosis/Assessment:  Goals Addressed            This Visit's Progress   . PharmD Care Plan       CARE PLAN ENTRY  Current Barriers:  . Chronic Disease Management support, education, and care coordination needs related to HTN, HLD, and obesity  Pharmacist Clinical Goal(s):  . Weight loss of 7 lbs by 6/25  - Call pharmacist if goal is met before 6/25 . Blood pressure <140/90 . Total cholesterol <200   Interventions: . Comprehensive medication review performed. . Consider adding melatonin 2.5 to 5 mg delayed release to current nightly melatonin dose to help you stay asleep . Start taking pantoprazole 17m every day in the morning at the same time to improve acid reflux symptoms  Patient Self Care Activities:  . Patient verbalizes understanding of plan to lose weight and interventions discussed above.  Keep up the good work with diet and exercise, please call [phone] any questions!   Initial goal documentation       Hypertension   Office blood pressures are  BP Readings from Last 3 Encounters:  07/11/19 125/76  12/31/18 126/78  09/10/18 122/78   Patient is currently controlled on HCTZ 12.567m1-2 tab daily and losartan 50 mg daily  Patient checks BP at home only when feeling symptomatic. No dizziness or falls reported, office visit readings have been consistently at goal.   We discussed diet and exercise extensively  Plan Continue current medications   Hyperlipidemia   Lipid Panel     Component Value Date/Time   CHOL 122 07/11/2019 1042   TRIG 102.0 07/11/2019 1042   HDL 48.30 07/11/2019 1042   CHOLHDL 3 07/11/2019 1042   VLDL 20.4 07/11/2019 1042   LDLCALC 53 07/11/2019 1042   The ASCVD Risk score (Goff DC Jr., et al., 2013) failed to calculate for the following reasons:   The valid total cholesterol range is 130 to 320 mg/dL   Patient has failed these meds in past: n/a  Patient is currently controlled on pravastatin last several yrs. No side effects have been reported today or during previous office visits. Lipids may be further improved through diet and exercise, which were discussed. Note Chronic Obesity assessment and plan.   Plan Continue current medications.    Chronic Obesity   Estimated body mass index is 42.48 kg/m as calculated from the following:   Height as of 07/11/19: 5' 8" (1.727 m).   Weight as of 07/11/19: 279 lb 6 oz (126.7 kg).    Ref Range & Units 10 d ago 6 mo ago 1 yr ago  TSH 0.35 - 4.50 uIU/mL 4.27  2.92  3.30    Patient has tired these meds in past: success with phentermine previously  Patient is currently controlled on the following medications: phentermine 37.100m once daily in the morning. Denies any new side effects such as decreased sleep since restarting phentermine. Had started taking melatonin prior to restarting phentermine.   TSH within upper limit of normal. No previous diagnosis of hypothyroidism. TSH has not been consistently elevated.   We discussed:  diet and exercise extensively. Agreed on goal weight loss of 7 lbs minimum by next clinical pharmacist follow-up call, 6/25. Uses stationary bike 3-5 times week for 15-30 minutes. Previously enjoyed taking part in silver sneakers program, encouraged to begin once again once she feels comfortable and safe.   Plan Continue current medications. Goal to lose at least 7 lbs by 6/25.  Acid Reflux   Acid reflux symptoms have not been controlled recently on pantoprazole 449monce daily. Reports reflux symptoms at least 3x/wk. Currently using pantoprazole 4090ms needed. Counseled on daily use of pantoprazole and taking first thing in am on consistent basis. Reflux symptoms could also be further improved with weight loss.   Patient verbalized understanding of proper medication administration.   Plan Change pantoprazole administration to same time every morning.   Sleep  Currently taking melatonin 5mg47mR every night at bedtime for sleep. Reports being able to fall asleep but wakes up ~3 hours after sleep onset. Discussed adding delayed release formulation of melatonin.   Plan Recommend adding 2.5-5mg 59mayed release melatonin.

## 2019-07-21 NOTE — Patient Instructions (Addendum)
Visit Information  Goals Addressed            This Visit's Progress   . PharmD Care Plan       CARE PLAN ENTRY  Current Barriers:  . Chronic Disease Management support, education, and care coordination needs related to HTN, HLD, and obesity  Pharmacist Clinical Goal(s):  . Weight loss of 7 lbs by 6/25  - Call pharmacist if goal is met before 6/25 . Blood pressure <140/90 . Total cholesterol <200  Interventions: . Comprehensive medication review performed. . Consider adding melatonin 2.5 to 5 mg delayed release to current nightly melatonin dose to help you stay asleep . Start taking pantoprazole 31m every day in the morning at the same time to improve acid reflux symptoms  Patient Self Care Activities:  . Patient verbalizes understanding of plan to lose weight and interventions discussed above.  Keep up the good work with diet and exercise, please call [phone] any questions!   Initial goal documentation        Shelby Reeves was given information about Chronic Care Management services today including:  1. CCM service includes personalized support from designated clinical staff supervised by her physician, including individualized plan of care and coordination with other care providers 2. 24/7 contact phone numbers for assistance for urgent and routine care needs. 3. Standard insurance, coinsurance, copays and deductibles apply for chronic care management only during months in which we provide at least 20 minutes of these services. Most insurances cover these services at 100%, however patients may be responsible for any copay, coinsurance and/or deductible if applicable. This service may help you avoid the need for more expensive face-to-face services. 4. Only one practitioner may furnish and bill the service in a calendar month. 5. The patient may stop CCM services at any time (effective at the end of the month) by phone call to the office staff.  Patient agreed to  services and verbal consent obtained.   The patient verbalized understanding of instructions provided today and agreed to receive a mailed copy of patient instruction and/or educational materials. Telephone follow up appointment with pharmacy team member scheduled for: 06/25  JMadelin Rear Pharm.D. Clinical Pharmacist LOlivetPrimary Care at [phone] to Lose Weight Exercise is structured, repetitive physical activity to improve fitness and health. Getting regular exercise is important for everyone. It is especially important if you are overweight. Being overweight increases your risk of heart disease, stroke, diabetes, high blood pressure, and several types of cancer. Reducing your calorie intake and exercising can help you lose weight. Exercise is usually categorized as moderate or vigorous intensity. To lose weight, most people need to do a certain amount of moderate-intensity or vigorous-intensity exercise each week. Moderate-intensity exercise  Moderate-intensity exercise is any activity that gets you moving enough to burn at least three times more energy (calories) than if you were sitting. Examples of moderate exercise include:  Walking a mile in 15 minutes.  Doing light yard work.  Biking at an easy pace. Most people should get at least 150 minutes (2 hours and 30 minutes) a week of moderate-intensity exercise to maintain their body weight. Vigorous-intensity exercise Vigorous-intensity exercise is any activity that gets you moving enough to burn at least six times more calories than if you were sitting. When you exercise at this intensity, you should be working hard enough that you are not able to carry on a conversation. Examples of vigorous exercise include:  Running.  Playing a team sport, such as football, basketball, and soccer.  Jumping rope. Most people should get at least 75 minutes (1 hour and 15 minutes) a week of vigorous-intensity  exercise to maintain their body weight. How can exercise affect me? When you exercise enough to burn more calories than you eat, you lose weight. Exercise also reduces body fat and builds muscle. The more muscle you have, the more calories you burn. Exercise also:  Improves mood.  Reduces stress and tension.  Improves your overall fitness, flexibility, and endurance.  Increases bone strength. The amount of exercise you need to lose weight depends on:  Your age.  The type of exercise.  Any health conditions you have.  Your overall physical ability. Talk to your health care provider about how much exercise you need and what types of activities are safe for you. What actions can I take to lose weight? Nutrition   Make changes to your diet as told by your health care provider or diet and nutrition specialist (dietitian). This may include: ? Eating fewer calories. ? Eating more protein. ? Eating less unhealthy fats. ? Eating a diet that includes fresh fruits and vegetables, whole grains, low-fat dairy products, and lean protein. ? Avoiding foods with added fat, salt, and sugar.  Drink plenty of water while you exercise to prevent dehydration or heat stroke. Activity  Choose an activity that you enjoy and set realistic goals. Your health care provider can help you make an exercise plan that works for you.  Exercise at a moderate or vigorous intensity most days of the week. ? The intensity of exercise may vary from person to person. You can tell how intense a workout is for you by paying attention to your breathing and heartbeat. Most people will notice their breathing and heartbeat get faster with more intense exercise.  Do resistance training twice each week, such as: ? Push-ups. ? Sit-ups. ? Lifting weights. ? Using resistance bands.  Getting short amounts of exercise can be just as helpful as long structured periods of exercise. If you have trouble finding time to exercise,  try to include exercise in your daily routine. ? Get up, stretch, and walk around every 30 minutes throughout the day. ? Go for a walk during your lunch break. ? Park your car farther away from your destination. ? If you take public transportation, get off one stop early and walk the rest of the way. ? Make phone calls while standing up and walking around. ? Take the stairs instead of elevators or escalators.  Wear comfortable clothes and shoes with good support.  Do not exercise so much that you hurt yourself, feel dizzy, or get very short of breath. Where to find more information  U.S. Department of Health and Human Services: [employer]  Centers for Disease Control and Prevention (CDC): http://www.wolf.info/ Contact a health care provider:  Before starting a new exercise program.  If you have questions or concerns about your weight.  If you have a medical problem that keeps you from exercising. Get help right away if you have any of the following while exercising:  Injury.  Dizziness.  Difficulty breathing or shortness of breath that does not go away when you stop exercising.  Chest pain.  Rapid heartbeat. Summary  Being overweight increases your risk of heart disease, stroke, diabetes, high blood pressure, and several types of cancer.  Losing weight happens when you burn more calories than you eat.  Reducing the amount of calories you  eat in addition to getting regular moderate or vigorous exercise each week helps you lose weight. This information is not intended to replace advice given to you by your health care provider. Make sure you discuss any questions you have with your health care provider. Document Revised: 04/27/2017 Document Reviewed: 04/27/2017 Elsevier Patient Education  2020 [employer].

## 2019-08-03 ENCOUNTER — Encounter: Payer: Self-pay | Admitting: Family Medicine

## 2019-08-04 DIAGNOSIS — H04123 Dry eye syndrome of bilateral lacrimal glands: Secondary | ICD-10-CM | POA: Diagnosis not present

## 2019-08-04 DIAGNOSIS — Z961 Presence of intraocular lens: Secondary | ICD-10-CM | POA: Diagnosis not present

## 2019-08-04 DIAGNOSIS — H40013 Open angle with borderline findings, low risk, bilateral: Secondary | ICD-10-CM | POA: Diagnosis not present

## 2019-08-18 DIAGNOSIS — H35363 Drusen (degenerative) of macula, bilateral: Secondary | ICD-10-CM | POA: Diagnosis not present

## 2019-08-18 DIAGNOSIS — Z961 Presence of intraocular lens: Secondary | ICD-10-CM | POA: Diagnosis not present

## 2019-08-18 DIAGNOSIS — H40013 Open angle with borderline findings, low risk, bilateral: Secondary | ICD-10-CM | POA: Diagnosis not present

## 2019-10-12 ENCOUNTER — Ambulatory Visit: Payer: Medicare Other

## 2019-10-14 ENCOUNTER — Other Ambulatory Visit: Payer: Self-pay | Admitting: General Practice

## 2019-10-14 MED ORDER — HYDROCHLOROTHIAZIDE 12.5 MG PO TABS: ORAL_TABLET | ORAL | 1 refills | Status: DC

## 2019-10-19 ENCOUNTER — Other Ambulatory Visit: Payer: Self-pay | Admitting: General Practice

## 2019-10-19 MED ORDER — LOSARTAN POTASSIUM 50 MG PO TABS: 50.0000 mg | ORAL_TABLET | Freq: Every day | ORAL | 0 refills | Status: DC

## 2019-10-21 ENCOUNTER — Telehealth: Payer: Medicare Other

## 2019-10-24 ENCOUNTER — Ambulatory Visit (INDEPENDENT_AMBULATORY_CARE_PROVIDER_SITE_OTHER): Payer: Medicare Other

## 2019-10-24 VITALS — BP 136/86 | HR 69 | Temp 98.5°F | Resp 16 | Ht 68.0 in | Wt 270.8 lb

## 2019-10-24 DIAGNOSIS — Z1231 Encounter for screening mammogram for malignant neoplasm of breast: Secondary | ICD-10-CM

## 2019-10-24 DIAGNOSIS — Z0000 Encounter for general adult medical examination without abnormal findings: Secondary | ICD-10-CM | POA: Diagnosis not present

## 2019-10-24 NOTE — Progress Notes (Signed)
Subjective:   MABLE DARA is a 74 y.o. female who presents for an Initial Medicare Annual Wellness Visit.   Review of Systems     Cardiac Risk Factors include: advanced age (>52mn, >>37women);dyslipidemia;hypertension;obesity (BMI >30kg/m2)     Objective:    Today's Vitals   10/24/19 1533  BP: 136/86  Pulse: 69  Resp: 16  Temp: 98.5 F (36.9 C)  TempSrc: Temporal  SpO2: 95%  Weight: 270 lb 12.8 oz (122.8 kg)  Height: 5' 8" (1.727 m)   Body mass index is 41.17 kg/m.  Advanced Directives 10/24/2019 10/06/2018 02/18/2017  Does Patient Have a Medical Advance Directive? Yes Yes Yes  Type of AParamedicof ALittle FlockLiving will HViolaLiving will Living will;Healthcare Power of A[place]waldin Chart? No - copy requested No - copy requested No - copy requested    Current Medications (verified) Outpatient Encounter Medications as of 10/24/2019  Medication Sig  . aspirin 81 MG tablet Take 81 mg by mouth daily.    . hydrochlorothiazide (HYDRODIURIL) 12.5 MG tablet 1-2 tablets daily depending on swelling  . losartan (COZAAR) 50 MG tablet Take 1 tablet (50 mg total) by mouth daily.  . Melatonin 5 MG CAPS Take by mouth.  . Multiple Vitamin (MULTIVITAMIN) tablet Take 1 tablet by mouth daily.    . pantoprazole (PROTONIX) 40 MG tablet Take 1 tablet (40 mg total) by mouth daily.  . pravastatin (PRAVACHOL) 40 MG tablet TAKE 1 TABLET(40 MG) BY MOUTH DAILY  . neomycin-polymyxin b-dexamethasone (MAXITROL) 3.5-10000-0.1 OINT APPLY 1 APPLICATION TO OD BID (Patient not taking: Reported on 10/24/2019)  . phentermine 37.5 MG capsule Take 1 capsule (37.5 mg total) by mouth every morning. (Patient not taking: Reported on 10/24/2019)  . [DISCONTINUED] lisinopril-hydrochlorothiazide (PRINZIDE,ZESTORETIC) 10-12.5 MG per tablet Take 1 tablet by mouth daily.   No facility-administered encounter medications on file as of  10/24/2019.    Allergies (verified) Iodine and Shellfish allergy   History: Past Medical History:  Diagnosis Date  . Allergy   . Carpal tunnel syndrome   . Fibroid   . GERD (gastroesophageal reflux disease)   . History of colon polyps 2002  . Hyperlipidemia   . Hypertension   . Vertigo    Past Surgical History:  Procedure Laterality Date  . BIOPSY THYROID     2018  . CARPAL TUNNEL RELEASE     bilateral  . CATARACT EXTRACTION W/ INTRAOCULAR LENS  IMPLANT, BILATERAL Bilateral 2018   July and August  . COLONOSCOPY     hx tubular adenomas 2003  . ENDOMETRIAL BIOPSY  09-12-09   --benign/Dr. Romine  . HYSTEROSCOPY  10-22-09   resection of polyps and D & C-Dr. RJoan Flores . HYSTEROSCOPY  06-17-91   and D & C  . KNEE ARTHROSCOPY  2010   right  . POLYPECTOMY    . UTERINE FIBROID SURGERY     Family History  Problem Relation Age of Onset  . Heart disease Mother   . Hypertension Mother   . Diabetes Mother   . Cancer Father        colon cancer?  . Stroke Sister   . Lung cancer Sister   . Colon cancer Neg Hx   . Stomach cancer Neg Hx   . Esophageal cancer Neg Hx   . Rectal cancer Neg Hx    Social History   Socioeconomic History  . Marital status: Widowed  Spouse name: Not on file  . Number of children: Not on file  . Years of education: Not on file  . Highest education level: Not on file  Occupational History  . Occupation: Retired  Tobacco Use  . Smoking status: Never Smoker  . Smokeless tobacco: Never Used  Vaping Use  . Vaping Use: Never used  Substance and Sexual Activity  . Alcohol use: Yes    Comment: rarely  . Drug use: No  . Sexual activity: Yes    Partners: Male  Other Topics Concern  . Not on file  Social History Narrative  . Not on file   Social Determinants of Health   Financial Resource Strain: Low Risk   . Difficulty of Paying Living Expenses: Not hard at all  Food Insecurity: No Food Insecurity  . Worried About Charity fundraiser in the  Last Year: Never true  . Ran Out of Food in the Last Year: Never true  Transportation Needs: No Transportation Needs  . Lack of Transportation (Medical): No  . Lack of Transportation (Non-Medical): No  Physical Activity: Insufficiently Active  . Days of Exercise per Week: 5 days  . Minutes of Exercise per Session: 20 min  Stress: No Stress Concern Present  . Feeling of Stress : Not at all  Social Connections: Moderately Isolated  . Frequency of Communication with Friends and Family: More than three times a week  . Frequency of Social Gatherings with Friends and Family: Twice a week  . Attends Religious Services: Never  . Active Member of Clubs or Organizations: Yes  . Attends Archivist Meetings: More than 4 times per year  . Marital Status: Widowed    Tobacco Counseling Counseling given: Not Answered   Clinical Intake:  Pre-visit preparation completed: Yes  Pain : No/denies pain     Nutritional Status: BMI > 30  Obese Nutritional Risks: None Diabetes: No  How often do you need to have someone help you when you read instructions, pamphlets, or other written materials from your doctor or pharmacy?: 1 - Never What is the last grade level you completed in school?: high school graduate  Diabetic?No  Interpreter Needed?: No  Information entered by :: Caroleen Hamman LPN   Activities of Daily Living In your present state of health, do you have any difficulty performing the following activities: 10/24/2019 07/11/2019  Hearing? Y N  Comment Bilateral hearing aids -  Vision? N N  Difficulty concentrating or making decisions? N N  Walking or climbing stairs? N N  Dressing or bathing? N N  Doing errands, shopping? N N  Preparing Food and eating ? N -  Using the Toilet? N -  In the past six months, have you accidently leaked urine? Y -  Comment wears a pad -  Do you have problems with loss of bowel control? N -  Managing your Medications? N -  Managing your  Finances? N -  Housekeeping or managing your Housekeeping? N -  Some recent data might be hidden    Patient Care Team: [name], MD as PCP - General Ladene Artist, MD as Consulting Physician (Gastroenterology) Jerrell Belfast, MD as Consulting Physician (Otolaryngology) Konrad Felix, MD as Referring Physician (Ophthalmology) Madelin Rear, [facility] as Pharmacist (Pharmacist)  Indicate any recent Medical Services you may have received from other than [facility] providers in the past year (date may be approximate).     Assessment:   This is a routine wellness examination  for [place].  Hearing/Vision screen  Hearing Screening   125Hz 250Hz 500Hz 1000Hz 2000Hz 3000Hz 4000Hz 6000Hz 8000Hz  Right ear:           Left ear:           Comments: Bilateral hearing aids  Vision Screening Comments: No issues Last eye exam 06/2019-sees Dr. Kathlen Mody  Dietary issues and exercise activities discussed: Current Exercise Habits: Home exercise routine, Type of exercise: Other - see comments (exercise bike), Time (Minutes): 20, Frequency (Times/Week): 5, Weekly Exercise (Minutes/Week): 100  Goals    . PharmD Care Plan     CARE PLAN ENTRY  Current Barriers:  . Chronic Disease Management support, education, and care coordination needs related to HTN, HLD, and obesity  Pharmacist Clinical Goal(s):  . Weight loss of 7 lbs by 6/25  - Call pharmacist if goal is met before 6/25 . Blood pressure <140/90 . Total cholesterol <200  Interventions: . Comprehensive medication review performed. . Consider adding melatonin 2.5 to 5 mg delayed release to current nightly melatonin dose to help you stay asleep . Start taking pantoprazole 104m every day in the morning at the same time to improve acid reflux symptoms  Patient Self Care Activities:  . Patient verbalizes understanding of plan to lose weight and interventions discussed above.  Keep up the good work with diet and exercise, please call [phone] with any questions!   Initial goal documentation     . Weight (lb) < 250 lb (113.4 kg)     Lose weight by watching diet and continuing to exercise.     . Weight (lb) < 250 lb (113.4 kg)     Lose weight       Depression Screen PHQ 2/9 Scores 10/24/2019 07/11/2019 10/06/2018 07/07/2018 03/03/2018 01/27/2018 08/12/2017  PHQ - 2 Score 1 0 0 0 0 0 0  PHQ- 9 Score - 0 - 0 0 0 0    Fall Risk Fall Risk  10/24/2019 07/11/2019 12/06/2018 10/06/2018 07/07/2018  Falls in the past year? 0 0 0 0 0  Number falls in past yr: 0 0 0 0 0  Injury with Fall? 0 0 0 - 0  Risk for fall due to : No Fall Risks - - - -  Risk for fall due to: Comment - - - - -  Follow up Falls prevention discussed Falls evaluation completed Falls evaluation completed - -    Any stairs in or around the home? No  Home free of loose throw rugs in walkways, pet beds, electrical cords, etc? Yes  Adequate lighting in your home to reduce risk of falls? Yes   ASSISTIVE DEVICES UTILIZED TO PREVENT FALLS:  Life alert? No  Use of a cane, walker or w/c? No  Grab bars in the bathroom? Yes  Shower chair or bench in shower? No  Elevated toilet seat or a handicapped toilet? No   TIMED UP AND GO:  Was the test performed? Yes.   Length of time to ambulate 10 feet: 11sec.   GAIT:  Appearance of gait: Gait slow and steady without the use of an assistive device.  Education: Fall risk prevention has been discussed.  Intervention(s) required? No   DME/home health order needed?  No  .    Cognitive Function:No cognitive impairment noted. Patient states she plays games on her computer frequently.  MMSE - Mini Mental State Exam 10/06/2018 02/18/2017  Orientation to time 5 5  Orientation to Place 5 5  Registration 3 3  Attention/ Calculation 5 5  Recall 3 3  Language- name 2 objects 2 2  Language- repeat 1 1  Language- follow 3 step command 3 3  Language- read & follow direction 1 1  Write a sentence 1 1  Copy design 1 1  Total  score 30 30        Immunizations Immunization History  Administered Date(s) Administered  . Fluad Quad(high Dose 65+) 12/31/2018  . Influenza Split 02/12/2011, 04/29/2012  . Influenza, High Dose Seasonal PF 02/18/2017, 01/27/2018  . Influenza,inj,Quad PF,6+ Mos 01/26/2013, 01/05/2014, 02/14/2016  . PFIZER SARS-COV-2 Vaccination 05/20/2019, 06/08/2019  . Pneumococcal Conjugate-13 02/07/2014  . Pneumococcal Polysaccharide-23 03/17/2008, 08/13/2016  . Tdap 12/31/2018  . Zoster Recombinat (Shingrix) 04/03/2017, 07/20/2017    TDAP status: Up to date   Flu Vaccine status: Up to date Due 12/2019  Pneumococcal vaccine status: Up to date   Covid-19 vaccine status: Completed vaccines  Qualifies for Shingles Vaccine?   Shingrix Completed?: Yes  Screening Tests Health Maintenance  Topic Date Due  . MAMMOGRAM  09/06/2019  . Hepatitis C Screening  07/10/2020 (Originally 05-Jul-1945)  . INFLUENZA VACCINE  11/27/2019  . DEXA SCAN  09/05/2020  . COLONOSCOPY  04/09/2023  . TETANUS/TDAP  12/30/2028  . COVID-19 Vaccine  Completed  . PNA vac Low Risk Adult  Completed    Health Maintenance  Health Maintenance Due  Topic Date Due  . MAMMOGRAM  09/06/2019    Colorectal cancer screening: Completed 04/08/2018. Repeat every 10 years  Mammogram: Ordered today. Patient advised that someone will be calling to schedule.  Bone Density status: Completed 09/06/2018. Results reflect: Bone density results: NORMAL. Repeat every 2 years.  Lung Cancer Screening: (Low Dose CT Chest recommended if Age 11-80 years, 30 pack-year currently smoking OR have quit w/in 15years.) does not qualify.    Additional Screening:  Hepatitis C Screening: does qualify.Discuss with PCP  Vision Screening: Recommended annual ophthalmology exams for early detection of glaucoma and other disorders of the eye. Is the patient up to date with their annual eye exam?  Yes  Who is the provider or what is the name of the  office in which the patient attends annual eye exams? Dr. Kathlen Mody  Dental Screening: Recommended annual dental exams for proper oral hygiene  Community Resource Referral / Chronic Care Management: CRR required this visit?  No   CCM required this visit?  No      Plan:     I have personally reviewed and noted the following in the patient's chart:   . Medical and social history . Use of alcohol, tobacco or illicit drugs  . Current medications and supplements . Functional ability and status . Nutritional status . Physical activity . Advanced directives . List of other physicians . Hospitalizations, surgeries, and ER visits in previous 12 months . Vitals . Screenings to include cognitive, depression, and falls . Referrals and appointments  In addition, I have reviewed and discussed with patient certain preventive protocols, quality metrics, and best practice recommendations. A written personalized care plan for preventive services as well as general preventive health recommendations were provided to patient.      Marta Antu, LPN   04/29/5850  Nurse Health Advisor  Nurse Notes:

## 2019-10-24 NOTE — Patient Instructions (Addendum)
Shelby Reeves , Thank you for taking time to come for your Medicare Wellness Visit. I appreciate your ongoing commitment to your health goals. Please review the following plan we discussed and let me know if I can assist you in the future.   Screening recommendations/referrals: Colonoscopy: Completed 04/08/2018-No longer indicated Mammogram: Completed 09/06/2018-Due-Referral placed. Someone will be calling you to schedule an appointment Bone Density: Completed 09/06/2018- Due 09/05/2020 Recommended yearly ophthalmology/optometry visit for glaucoma screening and checkup Recommended yearly dental visit for hygiene and checkup  Vaccinations: Influenza vaccine: Up to date-Due-12/2019 Pneumococcal vaccine: Completed vaccines Tdap vaccine: Up to date-Next due-12/30/2028 Shingles vaccine: Completed vaccines   Covid-19:Completed vaccines  Advanced directives: Please bring a copy to next office visit  Conditions/risks identified:See problem list  Next appointment: Follow up in one year for your annual wellness visit  11/05/2020 @ 11:15   Preventive Care 75 Years and Older, Female Preventive care refers to lifestyle choices and visits with your health care provider that can promote health and wellness. What does preventive care include?  A yearly physical exam. This is also called an annual well check.  Dental exams once or twice a year.  Routine eye exams. Ask your health care provider how often you should have your eyes checked.  Personal lifestyle choices, including:  Daily care of your teeth and gums.  Regular physical activity.  Eating a healthy diet.  Avoiding tobacco and drug use.  Limiting alcohol use.  Practicing safe sex.  Taking low-dose aspirin every day.  Taking vitamin and mineral supplements as recommended by your health care provider. What happens during an annual well check? The services and screenings done by your health care provider during your annual well check  will depend on your age, overall health, lifestyle risk factors, and family history of disease. Counseling  Your health care provider may ask you questions about your:  Alcohol use.  Tobacco use.  Drug use.  Emotional well-being.  Home and relationship well-being.  Sexual activity.  Eating habits.  History of falls.  Memory and ability to understand (cognition).  Work and work Statistician.  Reproductive health. Screening  You may have the following tests or measurements:  Height, weight, and BMI.  Blood pressure.  Lipid and cholesterol levels. These may be checked every 5 years, or more frequently if you are over 39 years old.  Skin check.  Lung cancer screening. You may have this screening every year starting at age 34 if you have a 30-pack-year history of smoking and currently smoke or have quit within the past 15 years.  Fecal occult blood test (FOBT) of the stool. You may have this test every year starting at age 15.  Flexible sigmoidoscopy or colonoscopy. You may have a sigmoidoscopy every 5 years or a colonoscopy every 10 years starting at age 40.  Hepatitis C blood test.  Hepatitis B blood test.  Sexually transmitted disease (STD) testing.  Diabetes screening. This is done by checking your blood sugar (glucose) after you have not eaten for a while (fasting). You may have this done every 1-3 years.  Bone density scan. This is done to screen for osteoporosis. You may have this done starting at age 77.  Mammogram. This may be done every 1-2 years. Talk to your health care provider about how often you should have regular mammograms. Talk with your health care provider about your test results, treatment options, and if necessary, the need for more tests. Vaccines  Your health care provider may recommend  certain vaccines, such as:  Influenza vaccine. This is recommended every year.  Tetanus, diphtheria, and acellular pertussis (Tdap, Td) vaccine. You may  need a Td booster every 10 years.  Zoster vaccine. You may need this after age 50.  Pneumococcal 13-valent conjugate (PCV13) vaccine. One dose is recommended after age 54.  Pneumococcal polysaccharide (PPSV23) vaccine. One dose is recommended after age 5. Talk to your health care provider about which screenings and vaccines you need and how often you need them. This information is not intended to replace advice given to you by your health care provider. Make sure you discuss any questions you have with your health care provider. Document Released: 05/11/2015 Document Revised: 01/02/2016 Document Reviewed: 02/13/2015 Elsevier Interactive Patient Education  2017 [employer] Prevention in the Home Falls can cause injuries. They can happen to people of all ages. There are many things you can do to make your home safe and to help prevent falls. What can I do on the outside of my home?  Regularly fix the edges of walkways and driveways and fix any cracks.  Remove anything that might make you trip as you walk through a door, such as a raised step or threshold.  Trim any bushes or trees on the path to your home.  Use bright outdoor lighting.  Clear any walking paths of anything that might make someone trip, such as rocks or tools.  Regularly check to see if handrails are loose or broken. Make sure that both sides of any steps have handrails.  Any raised decks and porches should have guardrails on the edges.  Have any leaves, snow, or ice cleared regularly.  Use sand or salt on walking paths during winter.  Clean up any spills in your garage right away. This includes oil or grease spills. What can I do in the bathroom?  Use night lights.  Install grab bars by the toilet and in the tub and shower. Do not use towel bars as grab bars.  Use non-skid mats or decals in the tub or shower.  If you need to sit down in the shower, use a plastic, non-slip stool.  Keep the floor  dry. Clean up any water that spills on the floor as soon as it happens.  Remove soap buildup in the tub or shower regularly.  Attach bath mats securely with double-sided non-slip rug tape.  Do not have throw rugs and other things on the floor that can make you trip. What can I do in the bedroom?  Use night lights.  Make sure that you have a light by your bed that is easy to reach.  Do not use any sheets or blankets that are too big for your bed. They should not hang down onto the floor.  Have a firm chair that has side arms. You can use this for support while you get dressed.  Do not have throw rugs and other things on the floor that can make you trip. What can I do in the kitchen?  Clean up any spills right away.  Avoid walking on wet floors.  Keep items that you use a lot in easy-to-reach places.  If you need to reach something above you, use a strong step stool that has a grab bar.  Keep electrical cords out of the way.  Do not use floor polish or wax that makes floors slippery. If you must use wax, use non-skid floor wax.  Do not have throw rugs and other  things on the floor that can make you trip. What can I do with my stairs?  Do not leave any items on the stairs.  Make sure that there are handrails on both sides of the stairs and use them. Fix handrails that are broken or loose. Make sure that handrails are as long as the stairways.  Check any carpeting to make sure that it is firmly attached to the stairs. Fix any carpet that is loose or worn.  Avoid having throw rugs at the top or bottom of the stairs. If you do have throw rugs, attach them to the floor with carpet tape.  Make sure that you have a light switch at the top of the stairs and the bottom of the stairs. If you do not have them, ask someone to add them for you. What else can I do to help prevent falls?  Wear shoes that:  Do not have high heels.  Have rubber bottoms.  Are comfortable and fit you  well.  Are closed at the toe. Do not wear sandals.  If you use a stepladder:  Make sure that it is fully opened. Do not climb a closed stepladder.  Make sure that both sides of the stepladder are locked into place.  Ask someone to hold it for you, if possible.  Clearly mark and make sure that you can see:  Any grab bars or handrails.  First and last steps.  Where the edge of each step is.  Use tools that help you move around (mobility aids) if they are needed. These include:  Canes.  Walkers.  Scooters.  Crutches.  Turn on the lights when you go into a dark area. Replace any light bulbs as soon as they burn out.  Set up your furniture so you have a clear path. Avoid moving your furniture around.  If any of your floors are uneven, fix them.  If there are any pets around you, be aware of where they are.  Review your medicines with your doctor. Some medicines can make you feel dizzy. This can increase your chance of falling. Ask your doctor what other things that you can do to help prevent falls. This information is not intended to replace advice given to you by your health care provider. Make sure you discuss any questions you have with your health care provider. Document Released: 02/08/2009 Document Revised: 09/20/2015 Document Reviewed: 05/19/2014 Elsevier Interactive Patient Education  2017 [employer].

## 2019-11-02 ENCOUNTER — Telehealth: Payer: Self-pay

## 2019-11-02 ENCOUNTER — Ambulatory Visit: Payer: Medicare Other

## 2019-11-02 NOTE — Progress Notes (Unsigned)
Chronic Care Management Pharmacy  Name: Shelby Reeves  MRN: [id number] DOB: 1945/08/29  Chief Complaint/ HPI  Shelby Reeves,  74 y.o. , female presents for their Follow-Up CCM visit with the clinical pharmacist via telephone due to COVID-19 Pandemic.  PCP : Shelby Minium, MD  Their chronic conditions include: hyperlipidemia, HTN, obesity   Reflux Weight loss - stationary bike, silver sneakers? Blood pressure  Office Visits: 10/24/2019 ([facility]): up to date on vaccines, including COVID, 10/24/2019: 270 lb. 07/11/19: 279 lbs, 07/21/2019 [facility]): discussed consistent use of pantoprazole due to ongoing reflux, stationary bike and silver sneakers encouraged.  07/11/2019 (PCP): 7 lb weight gain since 9/4 OV. Restarted on phentermine 37.5 every morning 12/31/2019 (PCP): flu vaccine administered, Tdap sent to pharmacy   Outpatient Encounter Medications as of 11/02/2019  Medication Sig  . aspirin 81 MG tablet Take 81 mg by mouth daily.    . hydrochlorothiazide (HYDRODIURIL) 12.5 MG tablet 1-2 tablets daily depending on swelling  . losartan (COZAAR) 50 MG tablet Take 1 tablet (50 mg total) by mouth daily.  . Melatonin 5 MG CAPS Take by mouth.  . Multiple Vitamin (MULTIVITAMIN) tablet Take 1 tablet by mouth daily.    Marland Kitchen neomycin-polymyxin b-dexamethasone (MAXITROL) 3.5-10000-0.1 OINT APPLY 1 APPLICATION TO OD BID (Patient not taking: Reported on 10/24/2019)  . pantoprazole (PROTONIX) 40 MG tablet Take 1 tablet (40 mg total) by mouth daily.  . phentermine 37.5 MG capsule Take 1 capsule (37.5 mg total) by mouth every morning. (Patient not taking: Reported on 10/24/2019)  . pravastatin (PRAVACHOL) 40 MG tablet TAKE 1 TABLET(40 MG) BY MOUTH DAILY  . [DISCONTINUED] lisinopril-hydrochlorothiazide (PRINZIDE,ZESTORETIC) 10-12.5 MG per tablet Take 1 tablet by mouth daily.   No facility-administered encounter medications on file as of 11/02/2019.   Current Diagnosis/Assessment:  Goals Addressed   None     Hypertension   Office blood pressures are  BP Readings from Last 3 Encounters:  10/24/19 136/86  07/11/19 125/76  12/31/18 126/78   Patient checks BP at home only when feeling symptomatic. No dizziness or falls reported, office visit readings have been consistently at goal. Patient is currently controlled on:  Hydrochlorothiazide 12.5mg  1-2 tab depending on swelling  Losartan 50 mg daily  We discussed diet and exercise extensively  Plan  Continue current medications   Hyperlipidemia   Lipid Panel     Component Value Date/Time   CHOL 122 07/11/2019 1042   TRIG 102.0 07/11/2019 1042   HDL 48.30 07/11/2019 1042   CHOLHDL 3 07/11/2019 1042   VLDL 20.4 07/11/2019 1042   LDLCALC 53 07/11/2019 1042   The ASCVD Risk score (Goff DC Jr., et al., 2013) failed to calculate for the following reasons:   The valid total cholesterol range is 130 to 320 mg/dL   LDL 53 on 06/2019. ***Denies any muscle or abdominal pain or n/v. Patient is currently {CHL Controlled/Uncontrolled:[id number]} on the following medications:  . Pravastatin 40 mg once daily   Plan  Continue current medications.    Chronic Obesity   Estimated body mass index is 42.48 kg/m as calculated from the following:   Height as of 07/11/19: 5\' 8"  (1.727 m).   Weight as of 07/11/19: 279 lb 6 oz (126.7 kg).  Patient has tired these meds in past: success with phentermine previously  Patient is currently controlled on the following medications: phentermine 37.5mg  once daily in the morning. Denies any new side effects such as decreased sleep since restarting phentermine. Had started taking melatonin  prior to restarting phentermine.   TSH within upper limit of normal. No previous diagnosis of hypothyroidism. TSH has not been consistently elevated.   We discussed:  diet and exercise extensively. Agreed on goal weight loss of 7 lbs minimum by next clinical pharmacist follow-up call, 6/25. Uses stationary bike 3-5 times week  for 15-30 minutes. Previously enjoyed taking part in silver sneakers program, encouraged to begin once again once she feels comfortable and safe.   Plan Continue current medications. Goal to lose at least 7 lbs by 6/25.  Acid Reflux   Acid reflux symptoms have not been controlled recently on pantoprazole 40mg  once daily. Reports reflux symptoms at least 3x/wk. Currently using pantoprazole 40mg  as needed. Counseled on daily use of pantoprazole and taking first thing in am on consistent basis. Reflux symptoms could also be further improved with weight loss.   Patient verbalized understanding of proper medication administration.   Plan Change pantoprazole administration to same time every morning.   Sleep  Currently taking melatonin 5mg   IR every night at bedtime for sleep. Reports being able to fall asleep but wakes up ~3 hours after sleep onset. Discussed adding delayed release formulation of melatonin.   Plan Recommend adding 2.5-5mg  delayed release melatonin.

## 2019-11-02 NOTE — Telephone Encounter (Signed)
  Chronic Care Management   Outreach Note   Name: Shelby Reeves MRN: [id number] DOB: [birth year]  Referred by: [name], MD Reason for referral: Telephone Appointment with [facility] Pharmacist, Madelin Rear.   An unsuccessful telephone outreach was attempted today. The patient was referred to the pharmacist for assistance with care management and care coordination.    Telephone appointment with clinical pharmacist today (11/02/2019) at 1pm. If patient immediately returns call, transfer to ext [phone]. Otherwise, please provide number below to patient to reschedule visit.   Madelin Rear, Pharm.D., BCGP Clinical Pharmacist [id number] [phone]

## 2019-11-07 ENCOUNTER — Other Ambulatory Visit: Payer: Self-pay

## 2019-11-07 ENCOUNTER — Encounter (HOSPITAL_BASED_OUTPATIENT_CLINIC_OR_DEPARTMENT_OTHER): Payer: Self-pay

## 2019-11-07 ENCOUNTER — Ambulatory Visit (HOSPITAL_BASED_OUTPATIENT_CLINIC_OR_DEPARTMENT_OTHER)
Admission: RE | Admit: 2019-11-07 | Discharge: 2019-11-07 | Disposition: A | Discharge status: Home / Self Care | Payer: Medicare Other | Source: Ambulatory Visit | Attending: Family Medicine | Admitting: Family Medicine

## 2019-11-07 DIAGNOSIS — Z1231 Encounter for screening mammogram for malignant neoplasm of breast: Secondary | ICD-10-CM | POA: Insufficient documentation

## 2019-11-23 ENCOUNTER — Other Ambulatory Visit: Payer: Self-pay | Admitting: Family Medicine

## 2020-01-10 ENCOUNTER — Other Ambulatory Visit: Payer: Self-pay

## 2020-01-10 ENCOUNTER — Ambulatory Visit (INDEPENDENT_AMBULATORY_CARE_PROVIDER_SITE_OTHER): Payer: Medicare Other | Admitting: Family Medicine

## 2020-01-10 ENCOUNTER — Encounter: Payer: Self-pay | Admitting: Family Medicine

## 2020-01-10 VITALS — BP 114/80 | HR 76 | Temp 98.0°F | Resp 16 | Ht 68.0 in | Wt 273.4 lb

## 2020-01-10 DIAGNOSIS — E785 Hyperlipidemia, unspecified: Secondary | ICD-10-CM

## 2020-01-10 DIAGNOSIS — I10 Essential (primary) hypertension: Secondary | ICD-10-CM

## 2020-01-10 DIAGNOSIS — Z23 Encounter for immunization: Secondary | ICD-10-CM

## 2020-01-10 LAB — BASIC METABOLIC PANEL
BUN: 19 mg/dL (ref 6–23)
CO2: 27 mEq/L (ref 19–32)
Calcium: 9.9 mg/dL (ref 8.4–10.5)
Chloride: 102 mEq/L (ref 96–112)
Creatinine, Ser: 0.68 mg/dL (ref 0.40–1.20)
GFR: 84.25 mL/min (ref >60.00)
Glucose, Bld: 91 mg/dL (ref 70–99)
Potassium: 3.9 mEq/L (ref 3.5–5.1)
Sodium: 137 mEq/L (ref 135–145)

## 2020-01-10 LAB — LIPID PANEL
Cholesterol: 133 mg/dL (ref 0–200)
HDL: 50.1 mg/dL (ref >39.00)
LDL Cholesterol: 62 mg/dL (ref 0–99)
NonHDL: 82.67
Total CHOL/HDL Ratio: 3
Triglycerides: 102 mg/dL (ref 0.0–149.0)
VLDL: 20.4 mg/dL (ref 0.0–40.0)

## 2020-01-10 LAB — CBC WITH DIFFERENTIAL/PLATELET
Basophils Absolute: 0 10*3/uL (ref 0.0–0.1)
Basophils Relative: 0.9 % (ref 0.0–3.0)
Eosinophils Absolute: 0.1 10*3/uL (ref 0.0–0.7)
Eosinophils Relative: 1.9 % (ref 0.0–5.0)
HCT: 41.3 % (ref 36.0–46.0)
Hemoglobin: 14.1 g/dL (ref 12.0–15.0)
Lymphocytes Relative: 26.7 % (ref 12.0–46.0)
Lymphs Abs: 1.1 10*3/uL (ref 0.7–4.0)
MCHC: 34.2 g/dL (ref 30.0–36.0)
MCV: 97.3 fl (ref 78.0–100.0)
Monocytes Absolute: 0.4 10*3/uL (ref 0.1–1.0)
Monocytes Relative: 10.8 % (ref 3.0–12.0)
Neutro Abs: 2.4 10*3/uL (ref 1.4–7.7)
Neutrophils Relative %: 59.7 % (ref 43.0–77.0)
Platelets: 238 10*3/uL (ref 150.0–400.0)
RBC: 4.24 Mil/uL (ref 3.87–5.11)
RDW: 14.4 % (ref 11.5–15.5)
WBC: 4 10*3/uL (ref 4.0–10.5)

## 2020-01-10 LAB — TSH: TSH: 3.27 u[IU]/mL (ref 0.35–4.50)

## 2020-01-10 LAB — HEPATIC FUNCTION PANEL
ALT: 17 U/L (ref 0–35)
AST: 17 U/L (ref 0–37)
Albumin: 4.3 g/dL (ref 3.5–5.2)
Alkaline Phosphatase: 70 U/L (ref 39–117)
Bilirubin, Direct: 0.2 mg/dL (ref 0.0–0.3)
Total Bilirubin: 1.1 mg/dL (ref 0.2–1.2)
Total Protein: 7 g/dL (ref 6.0–8.3)

## 2020-01-10 NOTE — Assessment & Plan Note (Signed)
Ongoing issue for pt.  BMI is 41.57  She is interested in meeting with a nutritionist to learn how to eat better.  Referral placed.

## 2020-01-10 NOTE — Assessment & Plan Note (Signed)
Chronic problem.  Well controlled today on Losartan 50mg  and HCTZ 12.5mg  daily.  Currently asymptomatic.  Check labs but no anticipated med changes.

## 2020-01-10 NOTE — Patient Instructions (Signed)
Schedule your complete physical in 6 months We'll notify you of your lab results and make any changes if needed Continue to work on healthy diet and regular exercise- you can do it! Call with any questions or concerns Stay Safe!  Stay Healthy! 

## 2020-01-10 NOTE — Assessment & Plan Note (Signed)
Chronic problem.  Tolerating Pravastatin 40mg daily w/o difficulty.  Check labs.  Adjust meds prn  

## 2020-01-10 NOTE — Progress Notes (Signed)
   Subjective:    Patient ID: Shelby Reeves, female    DOB: 10/24/1945, 74 y.o.   MRN: [id number]  HPI HTN- chronic problem, on Losartan 50mg  daily and HCTZ 12.5mg  as needed for swelling.  No CP, SOB, HAs, visual changes, edema.  Hyperlipidemia- chronic problem, on Pravastatin 40mg  daily.  No abd pain, N/V  Obesity- pt's BMI is 41.57  Pt is doing exercise bike 20 minutes nightly   Review of Systems For ROS see HPI   This visit occurred during the SARS-CoV-2 public health emergency.  Safety protocols were in place, including screening questions prior to the visit, additional usage of staff PPE, and extensive cleaning of exam room while observing appropriate contact time as indicated for disinfecting solutions.       Objective:   Physical Exam Vitals reviewed.  Constitutional:      General: She is not in acute distress.    Appearance: Normal appearance. She is well-developed. She is obese.  HENT:     Head: Normocephalic and atraumatic.  Eyes:     Conjunctiva/sclera: Conjunctivae normal.     Pupils: Pupils are equal, round, and reactive to light.  Neck:     Thyroid: No thyromegaly.  Cardiovascular:     Rate and Rhythm: Normal rate and regular rhythm.     Heart sounds: Normal heart sounds. No murmur heard.   Pulmonary:     Effort: Pulmonary effort is normal. No respiratory distress.     Breath sounds: Normal breath sounds.  Abdominal:     General: There is no distension.     Palpations: Abdomen is soft.     Tenderness: There is no abdominal tenderness.  Musculoskeletal:     Cervical back: Normal range of motion and neck supple.  Lymphadenopathy:     Cervical: No cervical adenopathy.  Skin:    General: Skin is warm and dry.  Neurological:     Mental Status: She is alert and oriented to person, place, and time.  Psychiatric:        Behavior: Behavior normal.           Assessment & Plan:

## 2020-01-11 ENCOUNTER — Encounter: Payer: Self-pay | Admitting: General Practice

## 2020-01-20 ENCOUNTER — Other Ambulatory Visit: Payer: Self-pay | Admitting: Family Medicine

## 2020-01-21 DIAGNOSIS — Z20828 Contact with and (suspected) exposure to other viral communicable diseases: Secondary | ICD-10-CM | POA: Diagnosis not present

## 2020-01-30 ENCOUNTER — Ambulatory Visit: Payer: Medicare Other

## 2020-02-06 ENCOUNTER — Telehealth: Payer: Self-pay

## 2020-02-06 NOTE — Progress Notes (Signed)
Spoke with patient and rescheduled Follow up appointment to 03-09-20 at 2 pm.  Georgiana Shore ,[place] Pharmacist Assistant [phone]

## 2020-03-09 ENCOUNTER — Telehealth: Payer: Medicare Other

## 2020-04-23 ENCOUNTER — Other Ambulatory Visit: Payer: Self-pay | Admitting: Family Medicine

## 2020-04-30 ENCOUNTER — Ambulatory Visit: Payer: Medicare Other | Admitting: Podiatry

## 2020-04-30 ENCOUNTER — Other Ambulatory Visit: Payer: Self-pay

## 2020-04-30 ENCOUNTER — Encounter: Payer: Self-pay | Admitting: Podiatry

## 2020-04-30 DIAGNOSIS — B351 Tinea unguium: Secondary | ICD-10-CM | POA: Diagnosis not present

## 2020-04-30 DIAGNOSIS — L603 Nail dystrophy: Secondary | ICD-10-CM | POA: Diagnosis not present

## 2020-04-30 DIAGNOSIS — Z79899 Other long term (current) drug therapy: Secondary | ICD-10-CM

## 2020-04-30 LAB — CBC WITH DIFFERENTIAL/PLATELET
Absolute Monocytes: 507 cells/uL (ref 200–950)
Basophils Absolute: 39 cells/uL (ref 0–200)
Basophils Relative: 1 %
Eosinophils Absolute: 70 cells/uL (ref 15–500)
Eosinophils Relative: 1.8 %
HCT: 40.4 % (ref 35.0–45.0)
Hemoglobin: 14.1 g/dL (ref 11.7–15.5)
Lymphs Abs: 1217 cells/uL (ref 850–3900)
MCH: 33.5 pg — ABNORMAL HIGH (ref 27.0–33.0)
MCHC: 34.9 g/dL (ref 32.0–36.0)
MCV: 96 fL (ref 80.0–100.0)
MPV: 9.8 fL (ref 7.5–12.5)
Monocytes Relative: 13 %
Neutro Abs: 2067 cells/uL (ref 1500–7800)
Neutrophils Relative %: 53 %
Platelets: 241 10*3/uL (ref 140–400)
RBC: 4.21 10*6/uL (ref 3.80–5.10)
RDW: 13 % (ref 11.0–15.0)
Total Lymphocyte: 31.2 %
WBC: 3.9 10*3/uL (ref 3.8–10.8)

## 2020-04-30 LAB — HEPATIC FUNCTION PANEL
AG Ratio: 1.8 (calc) (ref 1.0–2.5)
ALT: 23 U/L (ref 6–29)
AST: 23 U/L (ref 10–35)
Albumin: 4.3 g/dL (ref 3.6–5.1)
Alkaline phosphatase (APISO): 68 U/L (ref 37–153)
Bilirubin, Direct: 0.2 mg/dL (ref 0.0–0.2)
Globulin: 2.4 g/dL (calc) (ref 1.9–3.7)
Indirect Bilirubin: 1 mg/dL (calc) (ref 0.2–1.2)
Total Bilirubin: 1.2 mg/dL (ref 0.2–1.2)
Total Protein: 6.7 g/dL (ref 6.1–8.1)

## 2020-04-30 NOTE — Patient Instructions (Signed)
Terbinafine oral granules What is this medicine? TERBINAFINE (TER bin a feen) is an antifungal medicine. It is used to treat certain kinds of fungal or yeast infections. This medicine may be used for other purposes; ask your health care provider or pharmacist if you have questions. COMMON BRAND NAME(S): Lamisil What should I tell my health care provider before I take this medicine? They need to know if you have any of these conditions:  drink alcoholic beverages  kidney disease  liver disease  an unusual or allergic reaction to Terbinafine, other medicines, foods, dyes, or preservatives  pregnant or trying to get pregnant  breast-feeding How should I use this medicine? Take this medicine by mouth. Follow the directions on the prescription label. Hold packet with cut line on top. Shake packet gently to settle contents. Tear packet open along cut line, or use scissors to cut across line. Carefully pour the entire contents of packet onto a spoonful of a soft food, such as pudding or other soft, non-acidic food such as mashed potatoes (do NOT use applesauce or a fruit-based food). If two packets are required for each dose, you may either sprinkle the content of both packets on one spoonful of non-acidic food, or sprinkle the contents of both packets on two spoonfuls of non-acidic food. Make sure that no granules remain in the packet. Swallow the mxiture of the food and granules without chewing. Take your medicine at regular intervals. Do not take it more often than directed. Take all of your medicine as directed even if you think you are better. Do not skip doses or stop your medicine early. Contact your pediatrician or health care professional regarding the use of this medicine in children. While this medicine may be prescribed for children as young as 4 years for selected conditions, precautions do apply. Overdosage: If you think you have taken too much of this medicine contact a poison control  center or emergency room at once. NOTE: This medicine is only for you. Do not share this medicine with others. What if I miss a dose? If you miss a dose, take it as soon as you can. If it is almost time for your next dose, take only that dose. Do not take double or extra doses. What may interact with this medicine? Do not take this medicine with any of the following medications:  thioridazine This medicine may also interact with the following medications:  beta-blockers  caffeine  cimetidine  cyclosporine  MAOIs like Carbex, Eldepryl, Marplan, Nardil, and Parnate  medicines for fungal infections like fluconazole and ketoconazole  medicines for irregular heartbeat like amiodarone, flecainide and propafenone  rifampin  SSRIs like citalopram, escitalopram, fluoxetine, fluvoxamine, paroxetine and sertraline  tricyclic antidepressants like amitriptyline, clomipramine, desipramine, imipramine, nortriptyline, and others  warfarin This list may not describe all possible interactions. Give your health care provider a list of all the medicines, herbs, non-prescription drugs, or dietary supplements you use. Also tell them if you smoke, drink alcohol, or use illegal drugs. Some items may interact with your medicine. What should I watch for while using this medicine? Your doctor may monitor your liver function. Tell your doctor right away if you have nausea or vomiting, loss of appetite, stomach pain on your right upper side, yellow skin, dark urine, light stools, or are over tired. This medicine may cause serious skin reactions. They can happen weeks to months after starting the medicine. Contact your health care provider right away if you notice fevers or flu-like symptoms   with a rash. The rash may be red or purple and then turn into blisters or peeling of the skin. Or, you might notice a red rash with swelling of the face, lips or lymph nodes in your neck or under your arms. You need to take  this medicine for 6 weeks or longer to cure the fungal infection. Take your medicine regularly for as long as your doctor or health care provider tells you to. What side effects may I notice from receiving this medicine? Side effects that you should report to your doctor or health care professional as soon as possible:  allergic reactions like skin rash or hives, swelling of the face, lips, or tongue  change in vision  dark urine  fever or infection  general ill feeling or flu-like symptoms  light-colored stools  loss of appetite, nausea  rash, fever, and swollen lymph nodes  redness, blistering, peeling or loosening of the skin, including inside the mouth  right upper belly pain  unusually weak or tired  yellowing of the eyes or skin Side effects that usually do not require medical attention (report to your doctor or health care professional if they continue or are bothersome):  changes in taste  diarrhea  hair loss  muscle or joint pain  stomach upset This list may not describe all possible side effects. Call your doctor for medical advice about side effects. You may report side effects to FDA at 1-800-FDA-1088. Where should I keep my medicine? Keep out of the reach of children. Store at room temperature between 15 and 30 degrees C (59 and 86 degrees F). Throw away any unused medicine after the expiration date. NOTE: This sheet is a summary. It may not cover all possible information. If you have questions about this medicine, talk to your doctor, pharmacist, or health care provider.  2020 Elsevier/Gold Standard (2018-07-23 15:35:11)  

## 2020-05-01 ENCOUNTER — Other Ambulatory Visit: Payer: Self-pay | Admitting: Podiatry

## 2020-05-01 MED ORDER — TERBINAFINE HCL 250 MG PO TABS
250.0000 mg | ORAL_TABLET | Freq: Every day | ORAL | 0 refills | Status: DC
Start: 2020-05-01 — End: 2020-08-09

## 2020-05-01 NOTE — Progress Notes (Signed)
Sent lamisil. Pt aware

## 2020-05-02 ENCOUNTER — Telehealth: Payer: Self-pay | Admitting: Podiatry

## 2020-05-02 NOTE — Telephone Encounter (Signed)
Patient called in stating she read over instructions on meds and may have possible allergic reaction if taken due to shell fish and has requested to be prescribed something else, Please Advise

## 2020-05-03 ENCOUNTER — Telehealth: Payer: Self-pay | Admitting: Podiatry

## 2020-05-03 ENCOUNTER — Other Ambulatory Visit: Payer: Self-pay | Admitting: Podiatry

## 2020-05-03 MED ORDER — FLUCONAZOLE 150 MG PO TABS: 150.0000 mg | ORAL_TABLET | ORAL | 0 refills | Status: DC

## 2020-05-03 NOTE — Telephone Encounter (Signed)
The patient said she was reading the info about her Lamisil and she said that she is allergic to shellfish. It gives her hives. She wanted to know if it is safe for her to take or if something else needs to be called in

## 2020-05-03 NOTE — Progress Notes (Signed)
Sent fluconazole Is going to hold Lamisil

## 2020-05-03 NOTE — Telephone Encounter (Signed)
Heather called and spoke with the patient. Going to hold Lamisil and sent fluconazole

## 2020-05-07 NOTE — Progress Notes (Signed)
Subjective:   Patient ID: Shelby Reeves, female   DOB: 75 y.o.   MRN: [id number]   HPI 75 year old female presents the office today for concerns of her big toe and fifth toe nails becoming thick and discolored.  She previously had a right big toenail removed and is grown back again and she does not want that happen the other nails.  That is not causing significant pain and denies any redness or drainage but did become infected.  She is also interested in other treatment options as well.  No recent treatment.  No other concerns.   Review of Systems  All other systems reviewed and are negative.  Past Medical History:  Diagnosis Date  . Allergy   . Carpal tunnel syndrome   . Fibroid   . GERD (gastroesophageal reflux disease)   . History of colon polyps 2002  . Hyperlipidemia   . Hypertension   . Vertigo     Past Surgical History:  Procedure Laterality Date  . BIOPSY THYROID     2018  . CARPAL TUNNEL RELEASE     bilateral  . CATARACT EXTRACTION W/ INTRAOCULAR LENS  IMPLANT, BILATERAL Bilateral 2018   July and August  . COLONOSCOPY     hx tubular adenomas 2003  . ENDOMETRIAL BIOPSY  09-12-09   --benign/Dr. Romine  . HYSTEROSCOPY  10-22-09   resection of polyps and D & C-Dr. Joan Flores  . HYSTEROSCOPY  06-17-91   and D & C  . KNEE ARTHROSCOPY  2010   right  . POLYPECTOMY    . UTERINE FIBROID SURGERY       Current Outpatient Medications:  .  aspirin 81 MG tablet, Take 81 mg by mouth daily.  , Disp: , Rfl:  .  fluconazole (DIFLUCAN) 150 MG tablet, Take 1 tablet (150 mg total) by mouth once a week., Disp: 12 tablet, Rfl: 0 .  hydrochlorothiazide (HYDRODIURIL) 12.5 MG tablet, 1-2 tablets daily depending on swelling, Disp: 180 tablet, Rfl: 1 .  losartan (COZAAR) 50 MG tablet, TAKE 1 TABLET(50 MG) BY MOUTH DAILY, Disp: 90 tablet, Rfl: 1 .  Melatonin 5 MG CAPS, Take by mouth., Disp: , Rfl:  .  Multiple Vitamin (MULTIVITAMIN) tablet, Take 1 tablet by mouth daily.  , Disp: , Rfl:  .   neomycin-polymyxin b-dexamethasone (MAXITROL) 3.5-10000-0.1 OINT, APPLY 1 APPLICATION TO OD BID, Disp: , Rfl:  .  pantoprazole (PROTONIX) 40 MG tablet, Take 1 tablet (40 mg total) by mouth daily., Disp: 90 tablet, Rfl: 1 .  phentermine 37.5 MG capsule, Take 1 capsule (37.5 mg total) by mouth every morning., Disp: 90 capsule, Rfl: 0 .  pravastatin (PRAVACHOL) 40 MG tablet, TAKE 1 TABLET(40 MG) BY MOUTH DAILY, Disp: 90 tablet, Rfl: 1 .  terbinafine (LAMISIL) 250 MG tablet, Take 1 tablet (250 mg total) by mouth daily., Disp: 90 tablet, Rfl: 0  Allergies  Allergen Reactions  . Iodine Hives  . Shellfish Allergy Hives         Objective:  Physical Exam  General: AAO x3, NAD  Dermatological: Bilateral hallux toenails as well as foot that returns are hypertrophic and dystrophic with yellow-brown discoloration.  No hyperpigmented changes.  There is no open lesions.  There is no pain there is no redness or drainage or any signs of infection today.  Vascular: Dorsalis Pedis artery and Posterior Tibial artery pedal pulses are 2/4 bilateral with immedate capillary fill time. There is no pain with calf compression, swelling, warmth, erythema.   Neruologic:  Grossly intact via light touch bilateral.   Musculoskeletal: No gross boney pedal deformities bilateral. No pain, crepitus, or limitation noted with foot and ankle range of motion bilateral. Muscular strength 5/5 in all groups tested bilateral.  Gait: Unassisted, Nonantalgic.       Assessment:   Onychomycosis, onychodystrophy    Plan:  -Treatment options discussed including all alternatives, risks, and complications -Etiology of symptoms were discussed -I discussed the total nail removal with chemical matricectomy versus conservative treatment.  After discussion chose to proceed with conservative treatment and repeating all movement nails in the future.  We will check CBC and LFT for Lamisil.  Discussed topical and other options as well.  At  this time she wants to proceed with Lamisil.  *Patient later called with concerns about side effects and was switched to fluconazole weekly.  Trula Slade DPM

## 2020-05-16 ENCOUNTER — Telehealth: Payer: Self-pay

## 2020-05-16 NOTE — Chronic Care Management (AMB) (Signed)
    Chronic Care Management Pharmacy Assistant   Name: Shelby Reeves  MRN: [id number] DOB: 07/04/1945  Reason for Encounter: Disease State/ General Adherence Call   PCP : [name], MD  Allergies:   Allergies  Allergen Reactions  . Iodine Hives  . Shellfish Allergy Hives    Medications: Outpatient Encounter Medications as of 05/16/2020  Medication Sig  . aspirin 81 MG tablet Take 81 mg by mouth daily.    . fluconazole (DIFLUCAN) 150 MG tablet Take 1 tablet (150 mg total) by mouth once a week.  . hydrochlorothiazide (HYDRODIURIL) 12.5 MG tablet 1-2 tablets daily depending on swelling  . losartan (COZAAR) 50 MG tablet TAKE 1 TABLET(50 MG) BY MOUTH DAILY  . Melatonin 5 MG CAPS Take by mouth.  . Multiple Vitamin (MULTIVITAMIN) tablet Take 1 tablet by mouth daily.    Marland Kitchen neomycin-polymyxin b-dexamethasone (MAXITROL) 3.5-10000-0.1 OINT APPLY 1 APPLICATION TO OD BID  . pantoprazole (PROTONIX) 40 MG tablet Take 1 tablet (40 mg total) by mouth daily.  . phentermine 37.5 MG capsule Take 1 capsule (37.5 mg total) by mouth every morning.  . pravastatin (PRAVACHOL) 40 MG tablet TAKE 1 TABLET(40 MG) BY MOUTH DAILY  . terbinafine (LAMISIL) 250 MG tablet Take 1 tablet (250 mg total) by mouth daily.  . [DISCONTINUED] lisinopril-hydrochlorothiazide (PRINZIDE,ZESTORETIC) 10-12.5 MG per tablet Take 1 tablet by mouth daily.   No facility-administered encounter medications on file as of 05/16/2020.    Current Diagnosis: Patient Active Problem List   Diagnosis Date Noted  . Cyst of finger 10/03/2015  . Severe obesity (BMI >= 40) ([place]) 12/07/2013  . Ventral hernia 02/25/2013  . Paresthesia of left arm 09/23/2012  . Generalized anxiety disorder 09/23/2012  . General medical examination 02/12/2011  . Back pain 08/27/2010  . ORTHOSTATIC HYPOTENSION 06/28/2010  . VERTIGO 06/28/2010  . Hyperlipidemia 05/06/2010  . Essential hypertension 05/06/2010  . RHINITIS 05/06/2010    Have you  seen any other providers since your last visit with Madelin Rear, Pharm.D., BCGP?  04/30/2020 Campbell Stall, DPM (Podiatry) started Fluconazole 150 mg tablets once a week.  Have you had any problems recently with your health?  Patient states she has not had any problems recently with her health.  Have you had any problems with your pharmacy?  Patient states she has not had any problems recently with her pharmacy.  What issues or side effects are you having with your medications?  Patient states she is not currently having any side effects or medications from her medications.  What would you like me to pass along to Madelin Rear, [place].D., BCGP for them to help you with?   Nothing at this time. Patient states Madelin Rear, [place].D., BCGP is a very nice gentleman.  What can we do to take care of you better?  Patient states we are doing a great job at taking care of her and she wouldn't change anything.  April D Calhoun, [place] Pharmacist Assistant [phone]   Follow-Up:  Pharmacist Review

## 2020-05-22 ENCOUNTER — Other Ambulatory Visit: Payer: Self-pay | Admitting: Family Medicine

## 2020-06-11 ENCOUNTER — Other Ambulatory Visit: Payer: Self-pay

## 2020-06-11 ENCOUNTER — Ambulatory Visit: Payer: Medicare Other | Admitting: Podiatry

## 2020-06-11 DIAGNOSIS — Z79899 Other long term (current) drug therapy: Secondary | ICD-10-CM | POA: Diagnosis not present

## 2020-06-11 DIAGNOSIS — B351 Tinea unguium: Secondary | ICD-10-CM | POA: Diagnosis not present

## 2020-06-11 DIAGNOSIS — L600 Ingrowing nail: Secondary | ICD-10-CM | POA: Diagnosis not present

## 2020-06-11 MED ORDER — FLUCONAZOLE 150 MG PO TABS
150.0000 mg | ORAL_TABLET | ORAL | 0 refills | Status: DC
Start: 2020-06-11 — End: 2021-01-09

## 2020-06-11 NOTE — Patient Instructions (Signed)
Fluconazole tablets What is this medicine? FLUCONAZOLE (floo KON na zole) is an antifungal medicine. It is used to treat certain kinds of fungal or yeast infections. This medicine may be used for other purposes; ask your health care provider or pharmacist if you have questions. COMMON BRAND NAME(S): Diflucan What should I tell my health care provider before I take this medicine? They need to know if you have any of these conditions:  irregular heartbeat or rhythm  kidney disease  liver disease  low levels of potassium in the blood  an unusual or allergic reaction to fluconazole, other azole antifungals, medicines, foods, dyes, or preservatives  pregnant or trying to get pregnant  breast-feeding How should I use this medicine? Take this medicine by mouth. Follow the directions on the prescription label. Do not take your medicine more often than directed. Talk to your pediatrician regarding the use of this medicine in children. Special care may be needed. This medicine has been used in children as young as 6 months of age. Overdosage: If you think you have taken too much of this medicine contact a poison control center or emergency room at once. NOTE: This medicine is only for you. Do not share this medicine with others. What if I miss a dose? If you miss a dose, take it as soon as you can. If it is almost time for your next dose, take only that dose. Do not take double or extra doses. What may interact with this medicine? Do not take this medicine with any of the following medications:  flibanserin  lomitapide  lonafarnib  other medicines that prolong the QT interval (cause an abnormal heart rhythm)  triazolam This medicine may also interact with the following medications:  certain antibiotics like rifabutin, rifampin  certain antivirals for HIV or hepatitis  certain medicines for blood pressure, heart disease, irregular heartbeat  certain medicines for cholesterol like  atorvastatin, lovastatin, and simvastatin  certain medicines for depression, like amitriptyline, nortriptyline  certain medicines for diabetes like glipizide or glyburide  certain medicines for seizures like carbamazepine, phenytoin  certain medicines that treat or prevent blood clots like warfarin  certain narcotic medicines for pain like alfentanil, fentanyl, methadone  cyclophosphamide  cyclosporine  ibrutinib  lemborexant  midazolam  NSAIDS, medicines for pain and inflammation, like ibuprofen or naproxen  olaparib  sirolimus  steroid medicines like prednisone  tacrolimus  theophylline  tofacitinib  tolvaptan  vinblastine  vincristine  vitamin A  voriconazole This list may not describe all possible interactions. Give your health care provider a list of all the medicines, herbs, non-prescription drugs, or dietary supplements you use. Also tell them if you smoke, drink alcohol, or use illegal drugs. Some items may interact with your medicine. What should I watch for while using this medicine? Visit your doctor or health care professional for regular checkups. If you are taking this medicine for a long time you may need blood work. Tell your doctor if your symptoms do not improve. Some fungal infections need many weeks or months of treatment to cure. Alcohol can increase possible damage to your liver. Avoid alcoholic drinks. If you have a vaginal infection, do not have sex until you have finished your treatment. You can wear a sanitary napkin. Do not use tampons. Wear freshly washed cotton, not synthetic, panties. What side effects may I notice from receiving this medicine? Side effects that you should report to your doctor or health care professional as soon as possible:  allergic reactions like   skin rash or itching, hives, swelling of the lips, mouth, tongue, or throat  dark urine  feeling dizzy or faint  irregular heartbeat or chest pain  redness,  blistering, peeling or loosening of the skin, including inside the mouth  trouble breathing  unusual bruising or bleeding  vomiting  yellowing of the eyes or skin Side effects that usually do not require medical attention (report to your doctor or health care professional if they continue or are bothersome):  changes in how food tastes  diarrhea  headache  stomach upset or nausea This list may not describe all possible side effects. Call your doctor for medical advice about side effects. You may report side effects to FDA at 1-800-FDA-1088. Where should I keep my medicine? Keep out of the reach of children. Store at room temperature below 30 degrees C (86 degrees F). Throw away any medicine after the expiration date. NOTE: This sheet is a summary. It may not cover all possible information. If you have questions about this medicine, talk to your doctor, pharmacist, or health care provider.  2021 Elsevier/Gold Standard (2020-02-22 08:51:42)  

## 2020-06-14 NOTE — Progress Notes (Signed)
Subjective: 75 year old female presents the office today for follow-up evaluation of her starting fluconazole.  Overall she is doing better she is already able to see a difference with the medication.  She has only been taking this for the last 5 weeks. Denies any systemic complaints such as fevers, chills, nausea, vomiting. No acute changes since last appointment, and no other complaints at this time.   Objective: AAO x3, NAD DP/PT pulses palpable bilaterally, CRT less than 3 seconds There is clearing on the proximal nail fold.  The nails are growing out.  The distal portion nails are hypertrophic, dystrophic with yellow, brown discoloration.  No significant pain to the nails today and there is no redness or drainage or any signs of infection.  Mild incurvation of the nails without any signs of infection. No pain with calf compression, swelling, warmth, erythema  Assessment: Onychomycosis, currently on fluconazole  Plan: -All treatment options discussed with the patient including all alternatives, risks, complications.  -As she is doing well already continue fluconazole.  Recheck LFT, BMP in the next couple weeks.  We will do 3 more months of the medication.  We discussed partial nail avulsion if needed as there is incurvation of the nails but order to hold off on this and let the nails grow out.  As a courtesy debrided the nails x10 without any complications or bleeding. -Patient encouraged to call the office with any questions, concerns, change in symptoms.   Trula Slade DPM

## 2020-06-15 ENCOUNTER — Other Ambulatory Visit (INDEPENDENT_AMBULATORY_CARE_PROVIDER_SITE_OTHER): Payer: Medicare Other

## 2020-06-15 ENCOUNTER — Other Ambulatory Visit: Payer: Self-pay

## 2020-06-15 DIAGNOSIS — E785 Hyperlipidemia, unspecified: Secondary | ICD-10-CM | POA: Diagnosis not present

## 2020-06-15 DIAGNOSIS — I10 Essential (primary) hypertension: Secondary | ICD-10-CM

## 2020-06-15 LAB — BASIC METABOLIC PANEL
BUN: 13 mg/dL (ref 6–23)
CO2: 27 mEq/L (ref 19–32)
Calcium: 10.6 mg/dL — ABNORMAL HIGH (ref 8.4–10.5)
Chloride: 104 mEq/L (ref 96–112)
Creatinine, Ser: 0.82 mg/dL (ref 0.40–1.20)
GFR: 69.74 mL/min (ref >60.00)
Glucose, Bld: 107 mg/dL — ABNORMAL HIGH (ref 70–99)
Potassium: 3.3 mEq/L — ABNORMAL LOW (ref 3.5–5.1)
Sodium: 138 mEq/L (ref 135–145)

## 2020-06-15 LAB — HEPATIC FUNCTION PANEL
ALT: 23 U/L (ref 0–35)
AST: 24 U/L (ref 0–37)
Albumin: 4.2 g/dL (ref 3.5–5.2)
Alkaline Phosphatase: 68 U/L (ref 39–117)
Bilirubin, Direct: 0.2 mg/dL (ref 0.0–0.3)
Total Bilirubin: 1.3 mg/dL — ABNORMAL HIGH (ref 0.2–1.2)
Total Protein: 7.2 g/dL (ref 6.0–8.3)

## 2020-06-15 NOTE — Addendum Note (Signed)
Addended by: Jacob Moores on: 06/15/2020 02:12 PM   Modules accepted: Orders

## 2020-06-15 NOTE — Addendum Note (Signed)
Addended by: Lerry Liner on: 06/15/2020 02:07 PM   Modules accepted: Orders

## 2020-06-15 NOTE — Addendum Note (Signed)
Addended by: Jacob Moores on: 06/15/2020 02:07 PM   Modules accepted: Orders

## 2020-07-03 ENCOUNTER — Telehealth: Payer: Self-pay

## 2020-07-03 NOTE — Chronic Care Management (AMB) (Signed)
° ° °  Chronic Care Management Pharmacy Assistant   Name: Shelby Reeves  MRN: [id number] DOB: 07-27-1945  Reason for Encounter: Disease State/ General Adherence Call   Recent office visits:  n/a  Recent consult visits:  06/11/2020 [facility] (podiatry) Dr. Jacqualyn Posey; continue fluconazole, no medication changes indicated.  Hospital visits:  None in previous 6 months  Medications: Outpatient Encounter Medications as of 07/03/2020  Medication Sig   aspirin 81 MG tablet Take 81 mg by mouth daily.     fluconazole (DIFLUCAN) 150 MG tablet Take 1 tablet (150 mg total) by mouth once a week.   hydrochlorothiazide (HYDRODIURIL) 12.5 MG tablet TAKE 1 TO 2 TABLETS BY MOUTH DAILY DEPENDING ON SWELLING   losartan (COZAAR) 50 MG tablet TAKE 1 TABLET(50 MG) BY MOUTH DAILY   Melatonin 5 MG CAPS Take by mouth.   Multiple Vitamin (MULTIVITAMIN) tablet Take 1 tablet by mouth daily.     neomycin-polymyxin b-dexamethasone (MAXITROL) 3.5-10000-0.1 OINT APPLY 1 APPLICATION TO OD BID   pantoprazole (PROTONIX) 40 MG tablet Take 1 tablet (40 mg total) by mouth daily.   phentermine 37.5 MG capsule Take 1 capsule (37.5 mg total) by mouth every morning.   pravastatin (PRAVACHOL) 40 MG tablet TAKE 1 TABLET(40 MG) BY MOUTH DAILY   terbinafine (LAMISIL) 250 MG tablet Take 1 tablet (250 mg total) by mouth daily.   [DISCONTINUED] lisinopril-hydrochlorothiazide (PRINZIDE,ZESTORETIC) 10-12.5 MG per tablet Take 1 tablet by mouth daily.   No facility-administered encounter medications on file as of 07/03/2020.      Star Rating Drugs: Losartan 50 mg last filled 04/23/2020 90 DS Pravastatin 40 mg last filled 05/22/2020 90 DS Terbinafine 250 mg last filled 05/02/2020 90 DS HCTZ 12.5 mg last filled 05/22/2020 90 DS  Reviewed chart for medication changes ahead of disease state call.  No medication changes indicated.  Have you had any problems recently with your health? Patient states she has not had any problems  recently with her heatlh.  Have you had any problems with your pharmacy? Patient states she has not had any problems recently with her pharmacy.  What issues or side effects are you having with your medications? Patient states she is not currently having any issues or side effects from any of her medications.  What would you like me to pass along to Madelin Rear, CPP for him to help you with?  Patient states she does not have anything to pass along at this time.  What can we do to take care of you better? Patient states she is doing well at this time.  Future Appointments  Date Time Provider [place]  07/09/2020 10:30 AM [name], MD [facility]  09/10/2020 11:15 AM Trula Slade, DPM [facility]  11/05/2020 11:15 AM [facility]     Patient scheduled a follow-up telephone appointment with clinical pharmacist on 08/10/2020 at 2:00 pm.  April D Calhoun, [place] Pharmacist Assistant [phone]

## 2020-07-09 ENCOUNTER — Other Ambulatory Visit: Payer: Self-pay

## 2020-07-09 ENCOUNTER — Encounter: Payer: Self-pay | Admitting: Family Medicine

## 2020-07-09 ENCOUNTER — Ambulatory Visit (INDEPENDENT_AMBULATORY_CARE_PROVIDER_SITE_OTHER): Payer: Medicare Other | Admitting: Family Medicine

## 2020-07-09 VITALS — BP 130/85 | HR 60 | Temp 97.9°F | Resp 19 | Ht 68.0 in | Wt 269.4 lb

## 2020-07-09 DIAGNOSIS — Z0000 Encounter for general adult medical examination without abnormal findings: Secondary | ICD-10-CM | POA: Diagnosis not present

## 2020-07-09 LAB — TSH: TSH: 2.68 u[IU]/mL (ref 0.35–4.50)

## 2020-07-09 LAB — HEPATIC FUNCTION PANEL
ALT: 21 U/L (ref 0–35)
AST: 20 U/L (ref 0–37)
Albumin: 4 g/dL (ref 3.5–5.2)
Alkaline Phosphatase: 68 U/L (ref 39–117)
Bilirubin, Direct: 0.1 mg/dL (ref 0.0–0.3)
Total Bilirubin: 0.9 mg/dL (ref 0.2–1.2)
Total Protein: 7.2 g/dL (ref 6.0–8.3)

## 2020-07-09 LAB — CBC WITH DIFFERENTIAL/PLATELET
Basophils Absolute: 0 10*3/uL (ref 0.0–0.1)
Basophils Relative: 0.6 % (ref 0.0–3.0)
Eosinophils Absolute: 0.1 10*3/uL (ref 0.0–0.7)
Eosinophils Relative: 2.3 % (ref 0.0–5.0)
HCT: 40.9 % (ref 36.0–46.0)
Hemoglobin: 14 g/dL (ref 12.0–15.0)
Lymphocytes Relative: 34.8 % (ref 12.0–46.0)
Lymphs Abs: 1 10*3/uL (ref 0.7–4.0)
MCHC: 34.2 g/dL (ref 30.0–36.0)
MCV: 95.4 fl (ref 78.0–100.0)
Monocytes Absolute: 0.2 10*3/uL (ref 0.1–1.0)
Monocytes Relative: 9 % (ref 3.0–12.0)
Neutro Abs: 1.5 10*3/uL (ref 1.4–7.7)
Neutrophils Relative %: 53.3 % (ref 43.0–77.0)
Platelets: 264 10*3/uL (ref 150.0–400.0)
RBC: 4.29 Mil/uL (ref 3.87–5.11)
RDW: 13.7 % (ref 11.5–15.5)
WBC: 2.8 10*3/uL — ABNORMAL LOW (ref 4.0–10.5)

## 2020-07-09 LAB — LIPID PANEL
Cholesterol: 115 mg/dL (ref 0–200)
HDL: 44.1 mg/dL (ref >39.00)
LDL Cholesterol: 55 mg/dL (ref 0–99)
NonHDL: 70.97
Total CHOL/HDL Ratio: 3
Triglycerides: 79 mg/dL (ref 0.0–149.0)
VLDL: 15.8 mg/dL (ref 0.0–40.0)

## 2020-07-09 LAB — BASIC METABOLIC PANEL
BUN: 16 mg/dL (ref 6–23)
CO2: 28 mEq/L (ref 19–32)
Calcium: 10.3 mg/dL (ref 8.4–10.5)
Chloride: 103 mEq/L (ref 96–112)
Creatinine, Ser: 0.69 mg/dL (ref 0.40–1.20)
GFR: 84.58 mL/min (ref >60.00)
Glucose, Bld: 88 mg/dL (ref 70–99)
Potassium: 3.7 mEq/L (ref 3.5–5.1)
Sodium: 138 mEq/L (ref 135–145)

## 2020-07-09 NOTE — Progress Notes (Signed)
   Subjective:    Patient ID: Shelby Reeves, female    DOB: 06-02-1945, 75 y.o.   MRN: [id number]  HPI CPE- UTD on colonoscopy, mammo, pneumonia vaccines, Tdap, COVID.  Patient Care Team    Relationship Specialty Notifications Start End  [name], MD PCP - General   05/15/10   Ladene Artist, MD Consulting Physician Gastroenterology  02/09/15   Jerrell Belfast, MD Consulting Physician Otolaryngology  02/18/17   Konrad Felix, MD Referring Physician Ophthalmology  10/06/18   Madelin Rear, [facility] Pharmacist Pharmacist  07/18/19    Comment: Phone:  [phone])    Health Maintenance  Topic Date Due  . COVID-19 Vaccine (3 - Booster for Pfizer series) 12/06/2019  . Hepatitis C Screening  07/10/2020 (Originally 1945-09-13)  . DEXA SCAN  09/05/2020  . MAMMOGRAM  11/06/2020  . COLONOSCOPY (Pts 45-22yrs Insurance coverage will need to be confirmed)  04/09/2023  . TETANUS/TDAP  12/30/2028  . INFLUENZA VACCINE  Completed  . PNA vac Low Risk Adult  Completed  . HPV VACCINES  Aged Out      Review of Systems Patient reports no vision/ hearing changes, adenopathy,fever, persistant/recurrent hoarseness , swallowing issues, chest pain, palpitations, edema, persistant/recurrent cough, hemoptysis, dyspnea (rest/exertional/paroxysmal nocturnal), gastrointestinal bleeding (melena, rectal bleeding), significant heartburn, bowel changes, GU symptoms (dysuria, hematuria, incontinence), Gyn symptoms (abnormal  bleeding, pain),  syncope, focal weakness, memory loss, numbness & tingling, skin/hair/nail changes, abnormal bruising or bleeding, anxiety, or depression.   + 15 lb weight loss- 'i'm trying' + LLQ abd pain- intermittent, 'feels like there's air trapped there'  This visit occurred during the SARS-CoV-2 public health emergency.  Safety protocols were in place, including screening questions prior to the visit, additional usage of staff PPE, and extensive cleaning of exam room while observing  appropriate contact time as indicated for disinfecting solutions.       Objective:   Physical Exam General Appearance:    Alert, cooperative, no distress, appears stated age, obese  Head:    Normocephalic, without obvious abnormality, atraumatic  Eyes:    PERRL, conjunctiva/corneas clear, EOM's intact, fundi    benign, both eyes  Ears:    Normal TM's and external ear canals, both ears  Nose:   Deferred due to COVID  Throat:   Neck:   Supple, symmetrical, trachea midline, no adenopathy;    Thyroid: no enlargement/tenderness/nodules  Back:     Symmetric, no curvature, ROM normal, no CVA tenderness  Lungs:     Clear to auscultation bilaterally, respirations unlabored  Chest Wall:    No tenderness or deformity   Heart:    Regular rate and rhythm, S1 and S2 normal, no murmur, rub   or gallop  Breast Exam:    Deferred to mammo  Abdomen:     Soft, non-tender, bowel sounds active all four quadrants,    no masses, no organomegaly  Genitalia:    Deferred  Rectal:    Extremities:   Extremities normal, atraumatic, no cyanosis or edema  Pulses:   2+ and symmetric all extremities  Skin:   Skin color, texture, turgor normal, no rashes or lesions  Lymph nodes:   Cervical, supraclavicular, and axillary nodes normal  Neurologic:   CNII-XII intact, normal strength, sensation and reflexes    throughout          Assessment & Plan:

## 2020-07-09 NOTE — Assessment & Plan Note (Signed)
Pt's PE WNL w/ exception of obesity.  UTD on colonoscopy, mammo, immunizations.  Check labs.  Anticipatory guidance provided.

## 2020-07-09 NOTE — Patient Instructions (Signed)
Follow up in 6 months to recheck BP and cholesterol We'll notify you of your lab results and make any changes if needed Continue to work on healthy diet and regular exercise- you're doing great! Call with any questions or concerns Stay Safe!  Stay Healthy! Happy Spring!!! 

## 2020-07-09 NOTE — Assessment & Plan Note (Signed)
Pt is down 15 lbs!  Applauded her efforts at healthy diet and regular exercise.  Check labs to risk stratify.

## 2020-08-09 ENCOUNTER — Ambulatory Visit (INDEPENDENT_AMBULATORY_CARE_PROVIDER_SITE_OTHER): Payer: Medicare Other

## 2020-08-09 DIAGNOSIS — I10 Essential (primary) hypertension: Secondary | ICD-10-CM

## 2020-08-09 DIAGNOSIS — E785 Hyperlipidemia, unspecified: Secondary | ICD-10-CM

## 2020-08-09 NOTE — Patient Instructions (Addendum)
Shelby Reeves,  Thank you for talking with me today. I have included our care plan/goals in the following pages.   Please review and call me at [phone] with any questions.  Thanks! Ellin Mayhew, Pharm.D., BCGP Clinical Pharmacist [facility] [place] [phone] Patient Care Plan: [facility] pharmacy care plan    Problem Identified: HLD HTN Obesity   Priority: High    Long-Range Goal: Disease Management   Start Date: 08/09/2020  Expected End Date: 08/09/2021  This Visit's Progress: On track  Priority: High  Note:    Current Barriers:  . Working towards optimizing diet (lower carb/fat) and maintaining routine exercise (walking, stationary bike). No Medication related barriers at this time  Pharmacist Clinical Goal(s):  Marland Kitchen Patient will verbalize ability to afford treatment regimen through collaboration with PharmD and provider.   Interventions: . 1:1 collaboration with [name], MD regarding development and update of comprehensive plan of care as evidenced by provider attestation and co-signature . Inter-disciplinary care team collaboration (see longitudinal plan of care) . Comprehensive medication review performed; medication list updated in electronic medical record  Hypertension (BP goal <130/80) -Controlled -Hypotension Hx -Current treatment: . Losartan 50 mg once daily . HCTZ 12.5 mg - 1-2 tablets once daily -Current home readings: at goal historically, has not taken recent but has cuff.  -Current dietary habits: following lower carb diet, avoids bread. -Current exercise habits: doing best to stay active/on feet, some walking. Hoping to make use of stationary bike at home and get in habit of riding every day. Down 20 lb since last year per patient. -Denies recent hypotensive/hypertensive symptoms, no recent falls -Exercise goal of 150 minutes per week -No issues with compliance/no fill gaps noted -Recommended to  continue current medication  Hyperlipidemia: (LDL goal < 70-100) -Controlled -Primary prevention, HLD and HTN well controlled. On asa 81 mg. -Current treatment: . Pravastatin 40 mg once daily  -Tolerating without side effects, no issues with compliance -Educated on Cholesterol goals -Recommended to continue current medication  Acid Reflux (Goal: minimize symptoms) -Controlled -Reports significant improvement in reflux symptoms after losing weight -Current treatment  . Pantoprazole 40 mg once daily -Recommended to continue current medication,   Patient Goals/Self-Care Activities . Patient will:  - take medications as prescribed target a minimum of 150 minutes of moderate intensity exercise weekly  Follow Up Plan: [place] f/u telephone visit  Medication Assistance: None required.  Patient affirms current coverage meets needs.  Patient's preferred pharmacy is:  [facility] [place], [place] [address] [place] [address] Phone: [phone] Fax: [phone]  Follow Up:  Patient agrees to Care Plan and Follow-up.      The patient verbalized understanding of instructions provided today and agreed to receive a MyChart copy of patient instruction and/or educational materials. Telephone follow up appointment with pharmacy team member scheduled for: See next appointment with "Care Management Staff" under "What's Next" below.

## 2020-08-09 NOTE — Progress Notes (Signed)
Chronic Care Management Pharmacy Note  08/09/2020 Name:  Shelby Reeves MRN:  [id number] DOB:  June 06, 1945  Subjective: Shelby Reeves is an 75 y.o. year old female who is a primary patient of Tabori, Aundra Millet, MD.  The CCM team was consulted for assistance with disease management and care coordination needs.    Engaged with patient by telephone for follow up visit in response to provider referral for pharmacy case management and/or care coordination services.   Consent to Services:  The patient was given information about Chronic Care Management services, agreed to services, and gave verbal consent prior to initiation of services.  Please see initial visit note for detailed documentation.   Patient Care Team: Shelby Minium, MD as PCP - General Shelby Artist, MD as Consulting Physician (Gastroenterology) Shelby Belfast, MD as Consulting Physician (Otolaryngology) Konrad Felix, MD as Referring Physician (Ophthalmology) Shelby Reeves, [facility] as Pharmacist (Pharmacist)  Hospital visits: None in previous 6 months  Objective:  Lab Results  Component Value Date   CREATININE 0.69 07/09/2020   CREATININE 0.82 06/15/2020   CREATININE 0.68 01/10/2020    Lab Results  Component Value Date   HGBA1C 6.0 02/14/2016   Last diabetic Eye exam: No results found for: HMDIABEYEEXA  Last diabetic Foot exam: No results found for: HMDIABFOOTEX      Component Value Date/Time   CHOL 115 07/09/2020 1057   TRIG 79.0 07/09/2020 1057   HDL 44.10 07/09/2020 1057   CHOLHDL 3 07/09/2020 1057   VLDL 15.8 07/09/2020 1057   LDLCALC 55 07/09/2020 1057    Hepatic Function Latest Ref Rng & Units 07/09/2020 06/15/2020 04/30/2020  Total Protein 6.0 - 8.3 g/dL 7.2 7.2 6.7  Albumin 3.5 - 5.2 g/dL 4.0 4.2 -  AST 0 - 37 U/L 20 24 23   ALT 0 - 35 U/L 21 23 23   Alk Phosphatase 39 - 117 U/L 68 68 -  Total Bilirubin 0.2 - 1.2 mg/dL 0.9 1.3(H) 1.2  Bilirubin, Direct 0.0 - 0.3 mg/dL 0.1 0.2 0.2     Lab Results  Component Value Date/Time   TSH 2.68 07/09/2020 10:57 AM   TSH 3.27 01/10/2020 10:18 AM    CBC Latest Ref Rng & Units 07/09/2020 04/30/2020 01/10/2020  WBC 4.0 - 10.5 K/uL 2.8(L) 3.9 4.0  Hemoglobin 12.0 - 15.0 g/dL 14.0 14.1 14.1  Hematocrit 36.0 - 46.0 % 40.9 40.4 41.3  Platelets 150.0 - 400.0 K/uL 264.0 241 238.0    No results found for: VD25OH  Clinical ASCVD:  The ASCVD Risk score Shelby Bussing DC Jr., et al., 2013) failed to calculate for the following reasons:   The valid total cholesterol range is 130 to 320 mg/dL    Social History   Tobacco Use  Smoking Status Never Smoker  Smokeless Tobacco Never Used   BP Readings from Last 3 Encounters:  07/09/20 130/85  01/10/20 114/80  10/24/19 136/86   Pulse Readings from Last 3 Encounters:  07/09/20 60  01/10/20 76  10/24/19 69   Wt Readings from Last 3 Encounters:  07/09/20 269 lb 6.4 oz (122.2 kg)  01/10/20 273 lb 6 oz (124 kg)  10/24/19 270 lb 12.8 oz (122.8 kg)    Assessment: Review of patient past medical history, allergies, medications, health status, including review of consultants reports, laboratory and other test data, was performed as part of comprehensive evaluation and provision of chronic care management services.   SDOH:  (Social Determinants of Health) assessments and interventions performed:  Yes   CCM Care Plan  Allergies  Allergen Reactions  . Iodine Hives  . Shellfish Allergy Hives    Medications Reviewed Today    Reviewed by Shelby Reeves, [facility] (Pharmacist) on 08/09/20 at 1355  Med List Status: <None>  Medication Order Taking? Sig Documenting Provider Last Dose Status Informant  aspirin 81 MG tablet [id number] Yes Take 81 mg by mouth daily. [provider] Taking Active   fluconazole (DIFLUCAN) 150 MG tablet [id number] Yes Take 1 tablet (150 mg total) by mouth once a week. Trula Slade, DPM Taking Active   hydrochlorothiazide (HYDRODIURIL) 12.5 MG tablet [id number] Yes TAKE  1 TO 2 TABLETS BY MOUTH DAILY DEPENDING ON SWELLING Shelby Minium, MD Taking Active         Discontinued 07/21/11 1338 (Side effect (s))   losartan (COZAAR) 50 MG tablet [id number] Yes TAKE 1 TABLET(50 MG) BY MOUTH DAILY Shelby Minium, MD Taking Active   Melatonin 5 MG CAPS [id number] Yes Take by mouth. [provider] Taking Active   Multiple Vitamin (MULTIVITAMIN) tablet [id number] Yes Take 1 tablet by mouth daily. [provider] Taking Active   neomycin-polymyxin b-dexamethasone (MAXITROL) 3.5-10000-0.1 OINT [id number] Yes  [provider] Taking Active   pantoprazole (PROTONIX) 40 MG tablet [id number] Yes Take 1 tablet (40 mg total) by mouth daily. Shelby Minium, MD Taking Active   phentermine 37.5 MG capsule [id number] Yes Take 1 capsule (37.5 mg total) by mouth every morning. Shelby Minium, MD Taking Active   pravastatin (PRAVACHOL) 40 MG tablet [id number] Yes TAKE 1 TABLET(40 MG) BY MOUTH DAILY Shelby Minium, MD Taking Active   terbinafine (LAMISIL) 250 MG tablet [id number] Yes Take 1 tablet (250 mg total) by mouth daily. Trula Slade, DPM Taking Active           Patient Active Problem List   Diagnosis Date Noted  . Cyst of finger 10/03/2015  . Severe obesity (BMI >= 40) ([place]) 12/07/2013  . Ventral hernia 02/25/2013  . Paresthesia of left arm 09/23/2012  . Generalized anxiety disorder 09/23/2012  . General medical examination 02/12/2011  . Back pain 08/27/2010  . ORTHOSTATIC HYPOTENSION 06/28/2010  . VERTIGO 06/28/2010  . Hyperlipidemia 05/06/2010  . Essential hypertension 05/06/2010  . RHINITIS 05/06/2010    Immunization History  Administered Date(s) Administered  . Fluad Quad(high Dose 65+) 12/31/2018, 01/10/2020  . Influenza Split 02/12/2011, 04/29/2012  . Influenza, High Dose Seasonal PF 02/18/2017, 01/27/2018  . Influenza,inj,Quad PF,6+ Mos 01/26/2013, 01/05/2014, 02/14/2016  . PFIZER(Purple Top)SARS-COV-2  Vaccination 05/20/2019, 06/08/2019  . Pneumococcal Conjugate-13 02/07/2014  . Pneumococcal Polysaccharide-23 03/17/2008, 08/13/2016  . Tdap 12/31/2018  . Zoster Recombinat (Shingrix) 04/03/2017, 07/20/2017    Conditions to be addressed/monitored: HTN, HLD and Obesity  Care Plan : ccm pharmacy care plan  Updates made by Shelby Reeves, [facility] since 08/09/2020 12:00 AM    Problem: HLD HTN Obesity   Priority: High    Long-Range Goal: Disease Management   Start Date: 08/09/2020  Expected End Date: 08/09/2021  This Visit's Progress: On track  Priority: High  Note:    Current Barriers:  . Working towards optimizing diet (lower carb/fat) and maintaining routine exercise (walking, stationary bike). No Medication related barriers at this time  Pharmacist Clinical Goal(s):  Marland Kitchen Patient will verbalize ability to afford treatment regimen through collaboration with PharmD and provider.   Interventions: . 1:1 collaboration with Shelby Minium, MD regarding development and update of comprehensive plan of care as evidenced by  provider attestation and co-signature . Inter-disciplinary care team collaboration (see longitudinal plan of care) . Comprehensive medication review performed; medication list updated in electronic medical record  Hypertension (BP goal <130/80) -Controlled -Hypotension Hx -Current treatment: . Losartan 50 mg once daily . HCTZ 12.5 mg - 1-2 tablets once daily -Current home readings: at goal historically, has not taken recent but has cuff.  -Current dietary habits: following lower carb diet, avoids bread. -Current exercise habits: doing best to stay active/on feet, some walking. Hoping to make use of stationary bike at home and get in habit of riding every day. Down 20 lb since last year per patient. -Denies recent hypotensive/hypertensive symptoms, no recent falls -Exercise goal of 150 minutes per week -No issues with compliance/no fill gaps noted -Recommended to  continue current medication  Hyperlipidemia: (LDL goal < 70-100) -Controlled -Primary prevention, HLD and HTN well controlled. On asa 81 mg. -Current treatment: . Pravastatin 40 mg once daily  -Tolerating without side effects, no issues with compliance -Educated on Cholesterol goals -Recommended to continue current medication  Acid Reflux (Goal: minimize symptoms) -Controlled -Reports significant improvement in reflux symptoms after losing weight -Current treatment  . Pantoprazole 40 mg once daily -Recommended to continue current medication,   Patient Goals/Self-Care Activities . Patient will:  - take medications as prescribed target a minimum of 150 minutes of moderate intensity exercise weekly  Follow Up Plan: [place] f/u telephone visit  Medication Assistance: None required.  Patient affirms current coverage meets needs.  Patient's preferred pharmacy is:  [facility], [place] - [address] [place] [address] Phone: [phone] Fax: [phone]  Follow Up:  Patient agrees to Care Plan and Follow-up.    Shelby Reeves, Pharm.D., BCGP Clinical Pharmacist [facility] [place] [phone]

## 2020-08-10 ENCOUNTER — Telehealth: Payer: Medicare Other

## 2020-08-20 DIAGNOSIS — H04123 Dry eye syndrome of bilateral lacrimal glands: Secondary | ICD-10-CM | POA: Diagnosis not present

## 2020-08-20 DIAGNOSIS — H40013 Open angle with borderline findings, low risk, bilateral: Secondary | ICD-10-CM | POA: Diagnosis not present

## 2020-08-20 DIAGNOSIS — H02403 Unspecified ptosis of bilateral eyelids: Secondary | ICD-10-CM | POA: Diagnosis not present

## 2020-08-20 DIAGNOSIS — Z961 Presence of intraocular lens: Secondary | ICD-10-CM | POA: Diagnosis not present

## 2020-08-30 ENCOUNTER — Encounter: Payer: Self-pay | Admitting: Family Medicine

## 2020-08-31 ENCOUNTER — Other Ambulatory Visit: Payer: Self-pay

## 2020-08-31 DIAGNOSIS — L989 Disorder of the skin and subcutaneous tissue, unspecified: Secondary | ICD-10-CM

## 2020-09-10 ENCOUNTER — Other Ambulatory Visit: Payer: Self-pay

## 2020-09-10 ENCOUNTER — Encounter: Payer: Self-pay | Admitting: Podiatry

## 2020-09-10 ENCOUNTER — Ambulatory Visit: Payer: Medicare Other | Admitting: Podiatry

## 2020-09-10 DIAGNOSIS — L600 Ingrowing nail: Secondary | ICD-10-CM | POA: Diagnosis not present

## 2020-09-10 DIAGNOSIS — B351 Tinea unguium: Secondary | ICD-10-CM | POA: Diagnosis not present

## 2020-09-10 DIAGNOSIS — M79675 Pain in left toe(s): Secondary | ICD-10-CM | POA: Diagnosis not present

## 2020-09-10 MED ORDER — CEPHALEXIN 500 MG PO CAPS: 500.0000 mg | ORAL_CAPSULE | Freq: Three times a day (TID) | ORAL | 0 refills | Status: DC

## 2020-09-10 NOTE — Patient Instructions (Signed)

## 2020-09-12 NOTE — Progress Notes (Signed)
Subjective: 75 year old female presents the office today for nail fungus particularly her left first and fifth toenails which are very thick and discolored.  She wants to have these 2 toenails removed permanently.  She is asked about other treatment options the other toenails as the oral medication that she is on is not helping.  She has no pain the other toenails within the left first and fifth toenails.  Denies any systemic complaints such as fevers, chills, nausea, vomiting. No acute changes since last appointment, and no other complaints at this time.   Objective: AAO x3, NAD DP/PT pulses palpable bilaterally, CRT less than 3 seconds The left first and fifth toenails limb is significantly hypertrophic, dystrophic with brown discoloration and there is tenderness palpation on the dorsal aspect the nail.  Incurvation of the first and fifth digit toenails along medial and lateral nail borders.  There is no edema, erythema or signs of infection.  The other nails are mildly hypertrophic and dystrophic with discoloration.  There is no open lesions. No pain with calf compression, swelling, warmth, erythema  Assessment: 75 year old female with ingrown toenail, onychomycosis  Plan: -All treatment options discussed with the patient including all alternatives, risks, complications.  -At this time, the patient is requesting total nail removal with chemical matricectomy to the symptomatic portion of the nail. Risks and complications were discussed with the patient for which they understand and written consent was obtained. Under sterile conditions a total of 3 mL of a mixture of 2% lidocaine plain and 0.5% Marcaine plain was infiltrated in a hallux block fashion. Once anesthetized, the skin was prepped in sterile fashion. A tourniquet was then applied. Next the left first and fifth digit toenails were removed in total making sure to remove all nail borders.  Once the nails were ensured to be removed area was  debrided and the underlying skin was intact. There is no purulence identified in the procedure. Next phenol was then applied under standard conditions and copiously irrigated. Silvadene was applied. A dry sterile dressing was applied. After application of the dressing the tourniquet was removed and there is found to be an immediate capillary refill time to the digit. The patient tolerated the procedure well any complications. Post procedure instructions were discussed the patient for which he verbally understood. Follow-up in one week for nail check or sooner if any problems are to arise. Discussed signs/symptoms of infection and directed to call the office immediately should any occur or go directly to the emergency room. In the meantime, encouraged to call the office with any questions, concerns, changes symptoms. -Keflex -Jublia for the other nails.  -Patient encouraged to call the office with any questions, concerns, change in symptoms.   Trula Slade DPM

## 2020-10-01 ENCOUNTER — Ambulatory Visit: Payer: Medicare Other | Admitting: Podiatry

## 2020-10-08 ENCOUNTER — Ambulatory Visit: Payer: Medicare Other | Admitting: Podiatry

## 2020-10-08 ENCOUNTER — Other Ambulatory Visit: Payer: Self-pay

## 2020-10-08 ENCOUNTER — Encounter: Payer: Self-pay | Admitting: Podiatry

## 2020-10-08 DIAGNOSIS — B351 Tinea unguium: Secondary | ICD-10-CM

## 2020-10-08 DIAGNOSIS — L600 Ingrowing nail: Secondary | ICD-10-CM

## 2020-10-08 MED ORDER — EFINACONAZOLE 10 % EX SOLN: 1.0000 [drp] | Freq: Every day | CUTANEOUS | 11 refills | Status: DC

## 2020-10-08 NOTE — Patient Instructions (Signed)
Continue soaking in epsom salts twice a day followed by antibiotic ointment and a band-aid. Can leave uncovered at night. Continue this until completely healed.  If the area has not healed in 2 weeks, call the office for follow-up appointment, or sooner if any problems arise.  Monitor for any signs/symptoms of infection. Call the office immediately if any occur or go directly to the emergency room. Call with any questions/concerns.   Efinaconazole Topical Solution What is this medication? EFINACONAZOLE (e FEE na KON a zole) is an antifungal medicine. It is used totreat certain kinds of fungal infections of the toenail. This medicine may be used for other purposes; ask your health care provider orpharmacist if you have questions. COMMON BRAND NAME(S): JUBLIA What should I tell my care team before I take this medication? They need to know if you have any of these conditions: an unusual or allergic reaction to efinaconazole, other medicines, foods, dyes or preservatives pregnant or trying to get pregnant breast-feeding How should I use this medication? This medicine is for external use only. Do not take by mouth. Follow the directions on the label. Wash hands before and after use. Apply this medicine using the provided brush to cover the entire toenail. Do not use your medicine more often than directed. Finish the full course prescribed by your doctor or health care professional even if you think your condition is better. Do notstop using except on the advice of your doctor or health care professional. Talk to your pediatrician regarding the use of this medicine in children. While this drug may be prescribed for children as young as 6 years for selectedconditions, precautions do apply. Overdosage: If you think you have taken too much of this medicine contact apoison control center or emergency room at once. NOTE: This medicine is only for you. Do not share this medicine with others. What if I miss a  dose? If you miss a dose, use it as soon as you can. If it is almost time for yournext dose, use only that dose. Do not use double or extra doses. What may interact with this medication? Interactions have not been studied. Do not use any other nail products (i.e.,nail polish, pedicures) during treatment with this medicine. This list may not describe all possible interactions. Give your health care provider a list of all the medicines, herbs, non-prescription drugs, or dietary supplements you use. Also tell them if you smoke, drink alcohol, or use illegaldrugs. Some items may interact with your medicine. What should I watch for while using this medication? Do not get this medicine in your eyes. If you do, rinse out with plenty of cooltap water. Tell your doctor or health care professional if your symptoms do not start toget better or if they get worse. Wait for at least 10 minutes after bathing before applying this medication. After bathing, make sure that your feet are very dry. Fungal infections like moist conditions. Do not walk around barefoot. To help prevent reinfection, wear freshly washed cotton, not synthetic clothing. Tell your doctor or health care professional if you develop sores or blisters that do not heal properly. If your nail infection returns after you stop using this medicine, contact yourdoctor or health care professional. What side effects may I notice from receiving this medication? Side effects that you should report to your doctor or health care professionalas soon as possible: allergic reactions like skin rash, itching or hives, swelling of the face, lips, or tongue ingrown toenail Side effects that usually  do not require medical attention (report to yourdoctor or health care professional if they continue or are bothersome): mild skin irritation, burning, or itching This list may not describe all possible side effects. Call your doctor for medical advice about side effects.  You may report side effects to FDA at1-800-FDA-1088. Where should I keep my medication? Keep out of the reach of children. Store at room temperature between 20 and 25 degrees C (68 and 77 degrees F). Keep this medicine in the original container. Throw away any unused medicineafter the expiration date. This medicine is flammable. Avoid exposure to heat, fire, flame, and smoking. NOTE: This sheet is a summary. It may not cover all possible information. If you have questions about this medicine, talk to your doctor, pharmacist, orhealth care provider.  2022 Elsevier/Gold Standard (2018-08-23 16:14:11)

## 2020-10-10 NOTE — Progress Notes (Signed)
Subjective: Shelby Reeves is a 75 y.o.  female returns to office today for follow up evaluation after having left Hallux and 5th total nail avulsion performed. Patient has been soaking using Epsom salts and applying topical antibiotic covered with bandaid daily.  No significant pain but she has noticed black scabs present.  No drainage or pus.  patient denies fevers, chills, nausea, vomiting. Denies any calf pain, chest pain, SOB.   Objective:  General: Well developed, nourished, in no acute distress, alert and oriented x3   Dermatology: Skin is warm, dry and supple bilateral.  Left fifth and hallux nail border appears to be clean, dry, with surrounding scab. There is no surrounding erythema, edema, drainage/purulence. Oher nails with yellow discoloration but no significant pain.  There are no other lesions or other signs of infection present.  Neurovascular status: Intact. No lower extremity swelling; No pain with calf compression bilateral.  Musculoskeletal: No  tenderness to palpation of the nail beds. Muscular strength within normal limits bilateral.   Assesement and Plan: S/p partial nail avulsion, doing well.   -Continue soaking in epsom salts twice a day followed by antibiotic ointment and a band-aid. Can leave uncovered at night. Continue this until completely healed.  -If the area has not healed in 2 weeks, call the office for follow-up appointment, or sooner if any problems arise.  -Recent nuclear for the other toenails. -Monitor for any signs/symptoms of infection. Call the office immediately if any occur or go directly to the emergency room. Call with any questions/concerns.  Celesta Gentile, DPM

## 2020-10-12 ENCOUNTER — Telehealth: Payer: Self-pay

## 2020-10-12 NOTE — Chronic Care Management (AMB) (Signed)
    Chronic Care Management Pharmacy Assistant   Name: Shelby Reeves  MRN: [id number] DOB: 10-22-1945  Reason for Encounter: General adherence call   Recent office visits:  None  Recent consult visits:  10/08/20-Matthew Jacqualyn Posey DPM (Podiatry)- Office visit for Onychomycosis.  STARTED Efinaconazole 10% apply 1 drop topically daily.  Patient advised to continue soaking in epsom salts twice daily followed by the antibiotic ointment and band aid; leave uncovered at night.  Follow up as scheduled.  09/10/20-Matthew Jacqualyn Posey DPM(Podiatry)- Office visit for Onychomycosis.  STARTED Cephalexin 500mg  take 1 three times daily. Follow up in 2 weeks.  Hospital visits:  None in previous 6 months  Medications: Outpatient Encounter Medications as of 10/12/2020  Medication Sig   amoxicillin (AMOXIL) 500 MG capsule Take 500 mg by mouth 3 (three) times daily.   aspirin 81 MG tablet Take 81 mg by mouth daily.   cephALEXin (KEFLEX) 500 MG capsule Take 1 capsule (500 mg total) by mouth 3 (three) times daily.   Efinaconazole 10 % SOLN Apply 1 drop topically daily.   fluconazole (DIFLUCAN) 150 MG tablet Take 1 tablet (150 mg total) by mouth once a week.   hydrochlorothiazide (HYDRODIURIL) 12.5 MG tablet TAKE 1 TO 2 TABLETS BY MOUTH DAILY DEPENDING ON SWELLING   losartan (COZAAR) 50 MG tablet TAKE 1 TABLET(50 MG) BY MOUTH DAILY   Melatonin 5 MG CAPS Take by mouth.   Multiple Vitamin (MULTIVITAMIN) tablet Take 1 tablet by mouth daily.   neomycin-polymyxin b-dexamethasone (MAXITROL) 3.5-10000-0.1 OINT    pantoprazole (PROTONIX) 40 MG tablet Take 1 tablet (40 mg total) by mouth daily.   pravastatin (PRAVACHOL) 40 MG tablet TAKE 1 TABLET(40 MG) BY MOUTH DAILY   [DISCONTINUED] lisinopril-hydrochlorothiazide (PRINZIDE,ZESTORETIC) 10-12.5 MG per tablet Take 1 tablet by mouth daily.   No facility-administered encounter medications on file as of 10/12/2020.   Have you had any problems recently with your  health? Patient denies current problems with her health.  Have you had any problems with your pharmacy? Patient denies problems with obtaining her medications.   What issues or side effects are you having with your medications? Patient denies side effects from her medications.   What would you like me to pass along to [facility],CPP for them to help you with?  Patient stated there is nothing to pass along.   What can we do to take care of you better? Patient stated there is nothing else we can do to take better care of her.    Patient inquired whether she could receive the COVID booster shot at Dr Tabori's office or if she should go to her pharmacy. I phoned Dr Virgil Benedict office and relayed the message to the patient that the office does not offer the vaccine, but [facility] does have a calendar of when and where vaccines are offered through the [facility].  Explained to her how to find the calendar online or she may choose to visit her local pharmacy to obtain it.  Patient stated she is doing great and thanked me for the call.   Star Rating Drugs:  Losartan 50mg  Last filled 07/19/20  90 DS Pravastatin 40mg   Last filled 08/22/20  90 DS  [place]

## 2020-10-15 ENCOUNTER — Other Ambulatory Visit: Payer: Self-pay

## 2020-10-15 DIAGNOSIS — I10 Essential (primary) hypertension: Secondary | ICD-10-CM

## 2020-10-15 MED ORDER — LOSARTAN POTASSIUM 50 MG PO TABS: ORAL_TABLET | ORAL | 1 refills | Status: DC

## 2020-10-24 ENCOUNTER — Encounter: Payer: Self-pay | Admitting: *Deleted

## 2020-11-05 ENCOUNTER — Ambulatory Visit (INDEPENDENT_AMBULATORY_CARE_PROVIDER_SITE_OTHER): Payer: Medicare Other | Admitting: *Deleted

## 2020-11-05 DIAGNOSIS — Z0000 Encounter for general adult medical examination without abnormal findings: Secondary | ICD-10-CM | POA: Diagnosis not present

## 2020-11-05 NOTE — Patient Instructions (Addendum)
Shelby Reeves , Thank you for taking time to come for your Medicare Wellness Visit. I appreciate your ongoing commitment to your health goals. Please review the following plan we discussed and let me know if I can assist you in the future.   Screening recommendations/referrals: Colonoscopy: up to date Mammogram: up to date Bone Density: up to date Recommended yearly ophthalmology/optometry visit for glaucoma screening and checkup Recommended yearly dental visit for hygiene and checkup  Vaccinations: Influenza vaccine: up to date Pneumococcal vaccine: up to date Tdap vaccine: up to date Shingles vaccine: up to date    Advanced directives: copy requested  Conditions/risks identified: NA  Next appointment: 01-09-2021 @ 1:00 PM   Preventive Care 40 Years and Older, Female Preventive care refers to lifestyle choices and visits with your health care provide  r that can promote health and wellness. What does preventive care include? A yearly physical exam. This is also called an annual well check. Dental exams once or twice a year. Routine eye exams. Ask your health care provider how often you should have your eyes checked. Personal lifestyle choices, including: Daily care of your teeth and gums. Regular physical activity. Eating a healthy diet. Avoiding tobacco and drug use. Limiting alcohol use. Practicing safe sex. Taking low-dose aspirin every day. Taking vitamin and mineral supplements as recommended by your health care provider. What happens during an annual well check? The services and screenings done by your health care provider during your annual well check will depend on your age, overall health, lifestyle risk factors, and family history of disease. Counseling  Your health care provider may ask you questions about your: Alcohol use. Tobacco use. Drug use. Emotional well-being. Home and relationship well-being. Sexual activity. Eating habits. History of  falls. Memory and ability to understand (cognition). Work and work Statistician. Reproductive health. Screening  You may have the following tests or measurements: Height, weight, and BMI. Blood pressure. Lipid and cholesterol levels. These may be checked every 5 years, or more frequently if you are over 19 years old. Skin check. Lung cancer screening. You may have this screening every year starting at age 62 if you have a 30-pack-year history of smoking and currently smoke or have quit within the past 15 years. Fecal occult blood test (FOBT) of the stool. You may have this test every year starting at age 36. Flexible sigmoidoscopy or colonoscopy. You may have a sigmoidoscopy every 5 years or a colonoscopy every 10 years starting at age 64. Hepatitis C blood test. Hepatitis B blood test. Sexually transmitted disease (STD) testing. Diabetes screening. This is done by checking your blood sugar (glucose) after you have not eaten for a while (fasting). You may have this done every 1-3 years. Bone density scan. This is done to screen for osteoporosis. You may have this done starting at age 90. Mammogram. This may be done every 1-2 years. Talk to your health care provider about how often you should have regular mammograms. Talk with your health care provider about your test results, treatment options, and if necessary, the need for more tests. Vaccines  Your health care provider may recommend certain vaccines, such as: Influenza vaccine. This is recommended every year. Tetanus, diphtheria, and acellular pertussis (Tdap, Td) vaccine. You may need a Td booster every 10 years. Zoster vaccine. You may need this after age 61. Pneumococcal 13-valent conjugate (PCV13) vaccine. One dose is recommended after age 89. Pneumococcal polysaccharide (PPSV23) vaccine. One dose is recommended after age 43. Talk to your  health care provider about which screenings and vaccines you need and how often you need  them. This information is not intended to replace advice given to you by your health care provider. Make sure you discuss any questions you have with your health care provider. Document Released: 05/11/2015 Document Revised: 01/02/2016 Document Reviewed: 02/13/2015 Elsevier Interactive Patient Education  2017 [place] Prevention in the Home Falls can cause injuries. They can happen to people of all ages. There are many things you can do to make your home safe and to help prevent falls. What can I do on the outside of my home? Regularly fix the edges of walkways and driveways and fix any cracks. Remove anything that might make you trip as you walk through a door, such as a raised step or threshold. Trim any bushes or trees on the path to your home. Use bright outdoor lighting. Clear any walking paths of anything that might make someone trip, such as rocks or tools. Regularly check to see if handrails are loose or broken. Make sure that both sides of any steps have handrails. Any raised decks and porches should have guardrails on the edges. Have any leaves, snow, or ice cleared regularly. Use sand or salt on walking paths during winter. Clean up any spills in your garage right away. This includes oil or grease spills. What can I do in the bathroom? Use night lights. Install grab bars by the toilet and in the tub and shower. Do not use towel bars as grab bars. Use non-skid mats or decals in the tub or shower. If you need to sit down in the shower, use a plastic, non-slip stool. Keep the floor dry. Clean up any water that spills on the floor as soon as it happens. Remove soap buildup in the tub or shower regularly. Attach bath mats securely with double-sided non-slip rug tape. Do not have throw rugs and other things on the floor that can make you trip. What can I do in the bedroom? Use night lights. Make sure that you have a light by your bed that is easy to reach. Do not use  any sheets or blankets that are too big for your bed. They should not hang down onto the floor. Have a firm chair that has side arms. You can use this for support while you get dressed. Do not have throw rugs and other things on the floor that can make you trip. What can I do in the kitchen? Clean up any spills right away. Avoid walking on wet floors. Keep items that you use a lot in easy-to-reach places. If you need to reach something above you, use a strong step stool that has a grab bar. Keep electrical cords out of the way. Do not use floor polish or wax that makes floors slippery. If you must use wax, use non-skid floor wax. Do not have throw rugs and other things on the floor that can make you trip. What can I do with my stairs? Do not leave any items on the stairs. Make sure that there are handrails on both sides of the stairs and use them. Fix handrails that are broken or loose. Make sure that handrails are as long as the stairways. Check any carpeting to make sure that it is firmly attached to the stairs. Fix any carpet that is loose or worn. Avoid having throw rugs at the top or bottom of the stairs. If you do have throw rugs, attach them  to the floor with carpet tape. Make sure that you have a light switch at the top of the stairs and the bottom of the stairs. If you do not have them, ask someone to add them for you. What else can I do to help prevent falls? Wear shoes that: Do not have high heels. Have rubber bottoms. Are comfortable and fit you well. Are closed at the toe. Do not wear sandals. If you use a stepladder: Make sure that it is fully opened. Do not climb a closed stepladder. Make sure that both sides of the stepladder are locked into place. Ask someone to hold it for you, if possible. Clearly mark and make sure that you can see: Any grab bars or handrails. First and last steps. Where the edge of each step is. Use tools that help you move around (mobility aids)  if they are needed. These include: Canes. Walkers. Scooters. Crutches. Turn on the lights when you go into a dark area. Replace any light bulbs as soon as they burn out. Set up your furniture so you have a clear path. Avoid moving your furniture around. If any of your floors are uneven, fix them. If there are any pets around you, be aware of where they are. Review your medicines with your doctor. Some medicines can make you feel dizzy. This can increase your chance of falling. Ask your doctor what other things that you can do to help prevent falls. This information is not intended to replace advice given to you by your health care provider. Make sure you discuss any questions you have with your health care provider. Document Released: 02/08/2009 Document Revised: 09/20/2015 Document Reviewed: 05/19/2014 Elsevier Interactive Patient Education  2017 [employer].

## 2020-11-05 NOTE — Progress Notes (Signed)
Subjective:   Shelby Reeves is a 75 y.o. female who presents for Medicare Annual (Subsequent) preventive examination.  I connected with  Magdalen Spatz on 11/05/20 by a telephone enabled telemedicine application and verified that I am speaking with the correct person using two identifiers.   I discussed the limitations of evaluation and management by telemedicine. The patient expressed understanding and agreed to proceed.  Patient location: home  Provider location: Tele-Health Visit    Review of Systems     NA Cardiac Risk Factors include: advanced age (>60men, >47 women);hypertension;dyslipidemia     Objective:    Today's Vitals   11/05/20 1120  PainSc: 3    There is no height or weight on file to calculate BMI.  Advanced Directives 11/05/2020 10/24/2019 10/06/2018 02/18/2017  Does Patient Have a Medical Advance Directive? Yes Yes Yes Yes  Type of Paramedic of [place];Living will [place];Living will [place];Living will Living will;Healthcare Power of [place] in Chart? No - copy requested No - copy requested No - copy requested No - copy requested    Current Medications (verified) Outpatient Encounter Medications as of 11/05/2020  Medication Sig   aspirin 81 MG tablet Take 81 mg by mouth daily.   Efinaconazole 10 % SOLN Apply 1 drop topically daily.   hydrochlorothiazide (HYDRODIURIL) 12.5 MG tablet TAKE 1 TO 2 TABLETS BY MOUTH DAILY DEPENDING ON SWELLING   losartan (COZAAR) 50 MG tablet TAKE 1 TABLET(50 MG) BY MOUTH DAILY   Melatonin 5 MG CAPS Take by mouth.   Multiple Vitamin (MULTIVITAMIN) tablet Take 1 tablet by mouth daily.   neomycin-polymyxin b-dexamethasone (MAXITROL) 3.5-10000-0.1 OINT    pantoprazole (PROTONIX) 40 MG tablet Take 1 tablet (40 mg total) by mouth daily.   amoxicillin (AMOXIL) 500 MG capsule Take 500 mg by mouth 3 (three) times daily.    cephALEXin (KEFLEX) 500 MG capsule Take 1 capsule (500 mg total) by mouth 3 (three) times daily.   fluconazole (DIFLUCAN) 150 MG tablet Take 1 tablet (150 mg total) by mouth once a week. (Patient not taking: Reported on 11/05/2020)   pravastatin (PRAVACHOL) 40 MG tablet TAKE 1 TABLET(40 MG) BY MOUTH DAILY   [DISCONTINUED] lisinopril-hydrochlorothiazide (PRINZIDE,ZESTORETIC) 10-12.5 MG per tablet Take 1 tablet by mouth daily.   No facility-administered encounter medications on file as of 11/05/2020.    Allergies (verified) Iodine and Shellfish allergy   History: Past Medical History:  Diagnosis Date   Allergy    Carpal tunnel syndrome    Fibroid    GERD (gastroesophageal reflux disease)    History of colon polyps 2002   Hyperlipidemia    Hypertension    Vertigo    Past Surgical History:  Procedure Laterality Date   BIOPSY THYROID     2018   CARPAL TUNNEL RELEASE     bilateral   CATARACT EXTRACTION W/ INTRAOCULAR LENS  IMPLANT, BILATERAL Bilateral 2018   July and August   COLONOSCOPY     hx tubular adenomas 2003   ENDOMETRIAL BIOPSY  09-12-09   --benign/Dr. Romine   HYSTEROSCOPY  10-22-09   resection of polyps and D & C-Dr. Joan Flores   HYSTEROSCOPY  06-17-91   and D & C   KNEE ARTHROSCOPY  2010   right   POLYPECTOMY     UTERINE FIBROID SURGERY     Family History  Problem Relation Age of Onset   Heart disease Mother  Hypertension Mother    Diabetes Mother    Cancer Father        colon cancer?   Stroke Sister    Lung cancer Sister    Colon cancer Neg Hx    Stomach cancer Neg Hx    Esophageal cancer Neg Hx    Rectal cancer Neg Hx    Social History   Socioeconomic History   Marital status: Widowed    Spouse name: Not on file   Number of children: Not on file   Years of education: Not on file   Highest education level: Not on file  Occupational History   Occupation: Retired  Tobacco Use   Smoking status: Never   Smokeless tobacco: Never  Vaping Use   Vaping  Use: Never used  Substance and Sexual Activity   Alcohol use: Yes    Comment: rarely   Drug use: No   Sexual activity: Yes    Partners: Male  Other Topics Concern   Not on file  Social History Narrative   Not on file   Social Determinants of Health   Financial Resource Strain: Low Risk    Difficulty of Paying Living Expenses: Not hard at all  Food Insecurity: No Food Insecurity   Worried About Charity fundraiser in the Last Year: Never true   [place] in the Last Year: Never true  Transportation Needs: No Transportation Needs   Lack of Transportation (Medical): No   Lack of Transportation (Non-Medical): No  Physical Activity: Insufficiently Active   Days of Exercise per Week: 4 days   Minutes of Exercise per Session: 10 min  Stress: No Stress Concern Present   Feeling of Stress : Not at all  Social Connections: Moderately Integrated   Frequency of Communication with Friends and Family: More than three times a week   Frequency of Social Gatherings with Friends and Family: Three times a week   Attends Religious Services: 1 to 4 times per year   Active Member of Clubs or Organizations: Yes   Attends Archivist Meetings: More than 4 times per year   Marital Status: Widowed    Tobacco Counseling Counseling given: Not Answered   Clinical Intake:  Pre-visit preparation completed: Yes  Pain : 0-10 Pain Score: 3  Pain Type: Chronic pain Pain Location: Knee Pain Orientation: Right Pain Descriptors / Indicators: Aching Pain Onset: More than a month ago Pain Frequency: Intermittent Pain Relieving Factors: IBUPROPHEN  Pain Relieving Factors: IBUPROPHEN  Nutritional Risks: None Diabetes: No  How often do you need to have someone help you when you read instructions, pamphlets, or other written materials from your doctor or pharmacy?: 1 - Never  Diabetic?  NO  Interpreter Needed?: No  Information entered by :: Leroy Kennedy LPN   Activities of Daily  Living In your present state of health, do you have any difficulty performing the following activities: 11/05/2020 07/09/2020  Hearing? Y N  Vision? N N  Difficulty concentrating or making decisions? N N  Walking or climbing stairs? N N  Dressing or bathing? N N  Doing errands, shopping? N N  Preparing Food and eating ? N -  Using the Toilet? N -  In the past six months, have you accidently leaked urine? N -  Do you have problems with loss of bowel control? N -  Managing your Medications? N -  Managing your Finances? N -  Housekeeping or managing your Housekeeping? N -  Some  recent data might be hidden    Patient Care Team: [name], MD as PCP - General Ladene Artist, MD as Consulting Physician (Gastroenterology) Jerrell Belfast, MD as Consulting Physician (Otolaryngology) Konrad Felix, MD as Referring Physician (Ophthalmology) Madelin Rear, [facility] as Pharmacist (Pharmacist)  Indicate any recent Medical Services you may have received from other than [facility] providers in the past year (date may be approximate).     Assessment:   This is a routine wellness examination for [place].  Hearing/Vision screen Hearing Screening - Comments:: Hearing aids both ears Vision Screening - Comments:: Dr. Herbert Deaner Up to Date [id number]  Dietary issues and exercise activities discussed: Current Exercise Habits: Home exercise routine, Type of exercise: Other - see comments (stationary bike), Time (Minutes): 15, Frequency (Times/Week): 6, Weekly Exercise (Minutes/Week): 90, Intensity: Moderate   Goals Addressed             This Visit's Progress    Patient Stated       Patient would like to loose 10lb        Depression Screen PHQ 2/9 Scores 11/05/2020 07/09/2020 01/10/2020 10/24/2019 07/11/2019 10/06/2018 07/07/2018  PHQ - 2 Score 0 0 0 1 0 0 0  PHQ- 9 Score - 0 0 - 0 - 0    Fall Risk Fall Risk  11/05/2020 07/09/2020 01/10/2020 10/24/2019 07/11/2019  Falls in the past year? 0 0 0 0 0   Number falls in past yr: 0 0 0 0 0  Injury with Fall? 0 0 0 0 0  Risk for fall due to : - No Fall Risks - No Fall Risks -  Risk for fall due to: Comment - - - - -  Follow up Falls evaluation completed;Falls prevention discussed - Falls evaluation completed Falls prevention discussed Falls evaluation completed    FALL RISK PREVENTION PERTAINING TO THE HOME:  Any stairs in or around the home? Yes  If so, are there any without handrails? Yes  Home free of loose throw rugs in walkways, pet beds, electrical cords, etc? Yes  Adequate lighting in your home to reduce risk of falls? Yes   ASSISTIVE DEVICES UTILIZED TO PREVENT FALLS:  Life alert? No  Use of a cane, walker or w/c? No  Grab bars in the bathroom? No  Shower chair or bench in shower? No  Elevated toilet seat or a handicapped toilet? No   TIMED UP AND GO:  Was the test performed? No .   Tele-Health Visit  Cognitive Function: MMSE - Mini Mental State Exam 10/06/2018 02/18/2017  Orientation to time 5 5  Orientation to Place 5 5  Registration 3 3  Attention/ Calculation 5 5  Recall 3 3  Language- name 2 objects 2 2  Language- repeat 1 1  Language- follow 3 step command 3 3  Language- read & follow direction 1 1  Write a sentence 1 1  Copy design 1 1  Total score 30 30        Immunizations Immunization History  Administered Date(s) Administered   Fluad Quad(high Dose 65+) 12/31/2018, 01/10/2020   Influenza Split 02/12/2011, 04/29/2012   Influenza, High Dose Seasonal PF 02/18/2017, 01/27/2018   Influenza,inj,Quad PF,6+ Mos 01/26/2013, 01/05/2014, 02/14/2016   PFIZER(Purple Top)SARS-COV-2 Vaccination 05/20/2019, 06/08/2019   Pneumococcal Conjugate-13 02/07/2014   Pneumococcal Polysaccharide-23 03/17/2008, 08/13/2016   Tdap 12/31/2018   Zoster Recombinat (Shingrix) 04/03/2017, 07/20/2017    TDAP status: Up to date  Flu Vaccine status: Up to date  Pneumococcal vaccine status:  Up to date  Covid-19 vaccine  status: Completed vaccines  Qualifies for Shingles Vaccine? Yes   Zostavax completed No   Shingrix Completed?: Yes  Screening Tests Health Maintenance  Topic Date Due   Hepatitis C Screening  Never done   COVID-19 Vaccine (3 - Pfizer risk series) 07/06/2019   DEXA SCAN  09/05/2020   MAMMOGRAM  11/06/2020   INFLUENZA VACCINE  11/26/2020   COLONOSCOPY (Pts 45-71yrs Insurance coverage will need to be confirmed)  04/09/2023   TETANUS/TDAP  12/30/2028   PNA vac Low Risk Adult  Completed   Zoster Vaccines- Shingrix  Completed   HPV VACCINES  Aged Out    Health Maintenance  Health Maintenance Due  Topic Date Due   Hepatitis C Screening  Never done   COVID-19 Vaccine (3 - Pfizer risk series) 07/06/2019   DEXA SCAN  09/05/2020    Colorectal cancer screening: Type of screening: Colonoscopy. Completed 04-08-2018. Repeat every 5 years  Mammogram status: Completed 11-06-2020. Repeat every year  Bone Density status: Completed 09-14-2020. Results reflect: Bone density results: OSTEOPOROSIS. Repeat every 2 years.  Lung Cancer Screening: (Low Dose CT Chest recommended if Age 17-80 years, 30 pack-year currently smoking OR have quit w/in 15years.) does not qualify.   Lung Cancer Screening Referral: na  Additional Screening:  Hepatitis C Screening: does not qualify; Completed 07-10-2020  Vision Screening: Recommended annual ophthalmology exams for early detection of glaucoma and other disorders of the eye. Is the patient up to date with their annual eye exam?  Yes  Who is the provider or what is the name of the office in which the patient attends annual eye exams? Dr. Herbert Deaner If pt is not established with a provider, would they like to be referred to a provider to establish care?  Established .   Dental Screening: Recommended annual dental exams for proper oral hygiene  Community Resource Referral / Chronic Care Management: CRR required this visit?  No   CCM required this visit?  No       Plan:     I have personally reviewed and noted the following in the patient's chart:   Medical and social history Use of alcohol, tobacco or illicit drugs  Current medications and supplements including opioid prescriptions.  Functional ability and status Nutritional status Physical activity Advanced directives List of other physicians Hospitalizations, surgeries, and ER visits in previous 12 months Vitals Screenings to include cognitive, depression, and falls Referrals and appointments  In addition, I have reviewed and discussed with patient certain preventive protocols, quality metrics, and best practice recommendations. A written personalized care plan for preventive services as well as general preventive health recommendations were provided to patient.     Leroy Kennedy, LPN   [id number]   Nurse Notes:  NA

## 2020-11-20 ENCOUNTER — Other Ambulatory Visit: Payer: Self-pay

## 2020-11-20 DIAGNOSIS — I10 Essential (primary) hypertension: Secondary | ICD-10-CM

## 2020-11-20 DIAGNOSIS — E785 Hyperlipidemia, unspecified: Secondary | ICD-10-CM

## 2020-11-20 MED ORDER — HYDROCHLOROTHIAZIDE 12.5 MG PO TABS: ORAL_TABLET | ORAL | 1 refills | Status: DC

## 2020-11-20 MED ORDER — PRAVASTATIN SODIUM 40 MG PO TABS: ORAL_TABLET | ORAL | 1 refills | Status: DC

## 2020-12-04 ENCOUNTER — Encounter: Payer: Self-pay | Admitting: Podiatry

## 2020-12-04 ENCOUNTER — Ambulatory Visit (INDEPENDENT_AMBULATORY_CARE_PROVIDER_SITE_OTHER): Payer: Medicare Other

## 2020-12-04 ENCOUNTER — Other Ambulatory Visit: Payer: Self-pay

## 2020-12-04 ENCOUNTER — Ambulatory Visit: Payer: Medicare Other | Admitting: Podiatry

## 2020-12-04 DIAGNOSIS — M7751 Other enthesopathy of right foot: Secondary | ICD-10-CM | POA: Diagnosis not present

## 2020-12-04 DIAGNOSIS — M79674 Pain in right toe(s): Secondary | ICD-10-CM

## 2020-12-04 DIAGNOSIS — M7750 Other enthesopathy of unspecified foot: Secondary | ICD-10-CM

## 2020-12-04 DIAGNOSIS — L600 Ingrowing nail: Secondary | ICD-10-CM

## 2020-12-04 DIAGNOSIS — B351 Tinea unguium: Secondary | ICD-10-CM

## 2020-12-04 NOTE — Patient Instructions (Signed)

## 2020-12-10 ENCOUNTER — Encounter: Payer: Self-pay | Admitting: Family Medicine

## 2020-12-10 ENCOUNTER — Other Ambulatory Visit: Payer: Self-pay

## 2020-12-10 ENCOUNTER — Ambulatory Visit (INDEPENDENT_AMBULATORY_CARE_PROVIDER_SITE_OTHER): Payer: Medicare Other | Admitting: Family Medicine

## 2020-12-10 VITALS — BP 130/68 | HR 69 | Temp 97.6°F | Ht 68.0 in | Wt 274.0 lb

## 2020-12-10 DIAGNOSIS — H6691 Otitis media, unspecified, right ear: Secondary | ICD-10-CM | POA: Diagnosis not present

## 2020-12-10 MED ORDER — AMOXICILLIN 500 MG PO CAPS
500.0000 mg | ORAL_CAPSULE | Freq: Three times a day (TID) | ORAL | 0 refills | Status: AC
Start: 2020-12-10 — End: 2020-12-20

## 2020-12-10 NOTE — Progress Notes (Signed)
Subjective: 75 year old female presents the office today requesting right first and fifth toenails be removed because of thickened discolored causing discomfort.  She presented left side removed and has done well and she was on the right side performed.  No swelling redness or drainage at this time.  She states that she thinks that she pulled a tendon on the side of her right ankle.  No injury that she can report.  No increase in swelling but it sort intermittently to the side of her foot.  No recent treatment for this. Denies any systemic complaints such as fevers, chills, nausea, vomiting. No acute changes since last appointment, and no other complaints at this time.   Objective: AAO x3, NAD DP/PT pulses palpable bilaterally, CRT less than 3 seconds The procedure site is doing well.  The right foot the right first and fifth digit toes are significantly hypertrophic, dystrophic, ingrown with brown discoloration and there is tenderness to entire toenail.  There is no edema, erythema or signs of infection otherwise.  On the right foot there is tenderness palpation of the distal portion of the peroneal tendon on the insertion of the fifth metatarsal base.  No area of pinpoint tenderness.  No edema to this area.  Clinically the tendon appears to be intact.  No pain with calf compression, swelling, warmth, erythema  Assessment: Right hallux foot, fifth digit onychodystrophy/ingrown toenail; tendinitis right foot  Plan: -All treatment options discussed with the patient including all alternatives, risks, complications.  -X-rays obtained reviewed of the right foot.  No evidence of acute fracture.  Minimal spurring of the fifth metatarsal base. -In regards to tendinitis we discussed icing, stretching as well as using Voltaren gel as needed. -At this time, the patient is requesting total nail removal with chemical matricectomy to the symptomatic portion of the nail. Risks and complications were discussed with  the patient for which they understand and written consent was obtained. Under sterile conditions a total of 3 mL of a mixture of 2% lidocaine plain and 0.5% Marcaine plain was infiltrated in a digital block fashion. Once anesthetized, the skin was prepped in sterile fashion. A tourniquet was then applied. Next the right first, fifth nails were excised making sure to remove the entire offending nail border. Once the nails were ensured to be removed area was debrided and the underlying skin was intact. There is no purulence identified in the procedure. Next phenol was then applied under standard conditions and copiously irrigated. Silvadene was applied. A dry sterile dressing was applied. After application of the dressing the tourniquet was removed and there is found to be an immediate capillary refill time to the digit. The patient tolerated the procedure well any complications. Post procedure instructions were discussed the patient for which he verbally understood. Follow-up in one week for nail check or sooner if any problems are to arise. Discussed signs/symptoms of infection and directed to call the office immediately should any occur or go directly to the emergency room. In the meantime, encouraged to call the office with any questions, concerns, changes symptoms. -Patient encouraged to call the office with any questions, concerns, change in symptoms.   Trula Slade DPM

## 2020-12-10 NOTE — Progress Notes (Signed)
[facility] [address] [place] [address] Dept: [phone] Dept Fax: [phone]  Office Visit  Subjective:    Patient ID: Shelby Reeves, female    DOB: [birth year], 75 y.o..   MRN: [id number]  Chief Complaint  Patient presents with   Ear Pain    Pt c/o ear ache in right ear x2-3 weeks    History of Present Illness:  Patient is in today for with a complaint of gradually increasing right ear pain over the past 2-3 weeks. She notes that she has been having pain. She also feels a sensation of swelling in the ear. She has been hearing pulsations in the ear at times. [name] normally wears hearing aides. She notes she has had periodic ear infections in the past and was treated with an oral antibiotic. [place] and was worried about this worsening.  Past Medical History: Patient Active Problem List   Diagnosis Date Noted   Cyst of finger 10/03/2015   Severe obesity (BMI >= 40) ([place]) 12/07/2013   Ventral hernia 02/25/2013   Paresthesia of left arm 09/23/2012   Generalized anxiety disorder 09/23/2012   General medical examination 02/12/2011   Back pain 08/27/2010   ORTHOSTATIC HYPOTENSION 06/28/2010   VERTIGO 06/28/2010   Hyperlipidemia 05/06/2010   Essential hypertension 05/06/2010   RHINITIS 05/06/2010   Past Surgical History:  Procedure Laterality Date   BIOPSY THYROID     2018   CARPAL TUNNEL RELEASE     bilateral   CATARACT EXTRACTION W/ INTRAOCULAR LENS  IMPLANT, BILATERAL Bilateral 2018   July and August   COLONOSCOPY     hx tubular adenomas 2003   ENDOMETRIAL BIOPSY  09-12-09   --benign/Dr. Romine   HYSTEROSCOPY  10-22-09   resection of polyps and D & C-Dr. Joan Flores   HYSTEROSCOPY  06-17-91   and D & C   KNEE ARTHROSCOPY  2010   right   POLYPECTOMY     UTERINE FIBROID SURGERY     Family History  Problem Relation Age of Onset   Heart disease Mother    Hypertension  Mother    Diabetes Mother    Cancer Father        colon cancer?   Stroke Sister    Lung cancer Sister    Colon cancer Neg Hx    Stomach cancer Neg Hx    Esophageal cancer Neg Hx    Rectal cancer Neg Hx    Outpatient Medications Prior to Visit  Medication Sig Dispense Refill   aspirin 81 MG tablet Take 81 mg by mouth daily.     Efinaconazole 10 % SOLN Apply 1 drop topically daily. 4 mL 11   fluconazole (DIFLUCAN) 150 MG tablet Take 1 tablet (150 mg total) by mouth once a week. 8 tablet 0   hydrochlorothiazide (HYDRODIURIL) 12.5 MG tablet TAKE 1 TO 2 TABLETS BY MOUTH DAILY DEPENDING ON SWELLING 180 tablet 1   losartan (COZAAR) 50 MG tablet TAKE 1 TABLET(50 MG) BY MOUTH DAILY 90 tablet 1   Melatonin 5 MG CAPS Take by mouth.     Multiple Vitamin (MULTIVITAMIN) tablet Take 1 tablet by mouth daily.     neomycin-polymyxin b-dexamethasone (MAXITROL) 3.5-10000-0.1 OINT      pantoprazole (PROTONIX) 40 MG tablet Take 1 tablet (40 mg total) by mouth daily. 90 tablet 1   pravastatin (PRAVACHOL) 40 MG tablet TAKE 1 TABLET(40 MG) BY MOUTH DAILY 90  tablet 1   No facility-administered medications prior to visit.   Allergies  Allergen Reactions   Iodine Hives   Shellfish Allergy Hives    Objective:   Today's Vitals   12/10/20 1551  BP: 130/68  Pulse: 69  Temp: 97.6 F (36.4 C)  TempSrc: Temporal  SpO2: 93%  Weight: 274 lb (124.3 kg)  Height: '5\' 8"'$  (1.727 m)   Body mass index is 41.66 kg/m.   General: Well developed, well nourished. No acute distress. HEENT: Normocephalic, non-traumatic. The right external ear is normal. No pain with tragal manipulation. The   EAC has a small rim of wax. The right TM is dull. Psych: Alert and oriented. Normal mood and affect.  Health Maintenance Due  Topic Date Due   Hepatitis C Screening  Never done   COVID-19 Vaccine (3 - Pfizer risk series) 07/06/2019   DEXA SCAN  09/05/2020   MAMMOGRAM  11/06/2020   INFLUENZA VACCINE  11/26/2020      Assessment & Plan:   1. Acute right otitis media I suspect [name] has a low-grade right ear infection. I will treat her with a course of amoxicillin. Follow-up if not improving.  - amoxicillin (AMOXIL) 500 MG capsule; Take 1 capsule (500 mg total) by mouth 3 (three) times daily for 10 days.  Dispense: 30 capsule; Refill: 0  Haydee Salter, MD

## 2020-12-11 ENCOUNTER — Telehealth: Payer: Self-pay

## 2020-12-11 NOTE — Progress Notes (Signed)
    Chronic Care Management Pharmacy Assistant   Name: Shelby Reeves  MRN: [id number] DOB: [birth year]   Reason for Encounter: Disease State - General Adherence Call / R/S CPP Follow up     Recent office visits:  12/10/20 Arlester Marker, MD - Family Medicine - Acute right otitis media - Amoxicillin (AMOXIL) 500 MG capsule prescribed. Follow up as needed.  Recent consult visits:  12/04/20 Celesta Gentile, DPM - Podiatry - Tendonitis of ankle - Xray ordered of right foot. Discussed icing, stretching as well as using Voltaren gel as needed. Total nail removal performed in office without complication. Follow up as needed.   Hospital visits:  None in previous 6 months  Medications: Outpatient Encounter Medications as of 12/11/2020  Medication Sig   amoxicillin (AMOXIL) 500 MG capsule Take 1 capsule (500 mg total) by mouth 3 (three) times daily for 10 days.   aspirin 81 MG tablet Take 81 mg by mouth daily.   Efinaconazole 10 % SOLN Apply 1 drop topically daily.   fluconazole (DIFLUCAN) 150 MG tablet Take 1 tablet (150 mg total) by mouth once a week.   hydrochlorothiazide (HYDRODIURIL) 12.5 MG tablet TAKE 1 TO 2 TABLETS BY MOUTH DAILY DEPENDING ON SWELLING   losartan (COZAAR) 50 MG tablet TAKE 1 TABLET(50 MG) BY MOUTH DAILY   Melatonin 5 MG CAPS Take by mouth.   Multiple Vitamin (MULTIVITAMIN) tablet Take 1 tablet by mouth daily.   neomycin-polymyxin b-dexamethasone (MAXITROL) 3.5-10000-0.1 OINT    pantoprazole (PROTONIX) 40 MG tablet Take 1 tablet (40 mg total) by mouth daily.   pravastatin (PRAVACHOL) 40 MG tablet TAKE 1 TABLET(40 MG) BY MOUTH DAILY   [DISCONTINUED] lisinopril-hydrochlorothiazide (PRINZIDE,ZESTORETIC) 10-12.5 MG per tablet Take 1 tablet by mouth daily.   No facility-administered encounter medications on file as of 12/11/2020.    Have you had any problems recently with your health? Patient denied any current problems or concerns with her health.   Have you had any problems  with your pharmacy? Patient denied any problems or concerns with her current pharmacy.   What issues or side effects are you having with your medications? Patient denied any side effects or issues with her current medication regimen.   What would you like me to pass along to Madelin Rear, CPP for them to help you with?  Patient did not have anything she wanted to pass along to CPP at this time. She stated she is doing well right now.   What can we do to take care of you better? Patient did not have any recommendations at this time and she is currently satisified with her current level of care.   Star Rating Drugs: Pravastatin (PRAVACHOL) 40 MG tablet - last filled 11/20/20 90 days  Losartan (COZAAR) 50 MG tablet - last filled 10/15/20 90 days    Future Appointments  Date Time Provider [facility]  12/25/2020  4:00 PM Trula Slade, [place] [facility]  01/09/2021  1:00 PM [name], MD [facility]  01/11/2021  1:30 PM [facility] PHARMACIST [facility]   Patient rescheduled for Follow up CPP phone visit for : September 21st @ 3:30 pm. Patient confirmed appointment date and time.    Jobe Gibbon, [place] Pharmacist Assistant  [phone]  Time Spent: 40 minutes

## 2020-12-25 ENCOUNTER — Ambulatory Visit: Payer: Medicare Other | Admitting: Podiatry

## 2021-01-01 DIAGNOSIS — L281 Prurigo nodularis: Secondary | ICD-10-CM | POA: Diagnosis not present

## 2021-01-01 DIAGNOSIS — L820 Inflamed seborrheic keratosis: Secondary | ICD-10-CM | POA: Diagnosis not present

## 2021-01-01 DIAGNOSIS — L821 Other seborrheic keratosis: Secondary | ICD-10-CM | POA: Diagnosis not present

## 2021-01-03 ENCOUNTER — Other Ambulatory Visit: Payer: Self-pay

## 2021-01-03 ENCOUNTER — Ambulatory Visit (INDEPENDENT_AMBULATORY_CARE_PROVIDER_SITE_OTHER): Payer: Medicare Other | Admitting: Podiatry

## 2021-01-03 DIAGNOSIS — M7750 Other enthesopathy of unspecified foot: Secondary | ICD-10-CM | POA: Diagnosis not present

## 2021-01-03 DIAGNOSIS — L600 Ingrowing nail: Secondary | ICD-10-CM

## 2021-01-08 NOTE — Progress Notes (Signed)
Subjective: 75 year old female presents the office today for Evaluation after undergoing right first and fifth digit total nail avulsion as well as for tenderness on the right side.  On the right fifth toenail there is a thick scab present which does cause discomfort.  No drainage.  On the left big toe there is a piece of skin, nail that is sticking out.  Objective: AAO x3, NAD DP/PT pulses palpable bilaterally, CRT less than 3 seconds Status post total nail avulsions of the right hallux and fifth digit nails.  Small scab on the hallux and a thicker scab present on the fifth digit toenail.  There is no drainage or pus or any edema.  No ascending cellulitis.  No fluctuation.  On the left hallux toenail small piece of nail/ is present at the distal portion which I debrided without any complications or bleeding. No other areas of significant tenderness on palpation today. no open lesions or pre-ulcerative lesions.  No pain with calf compression, swelling, warmth, erythema  Assessment: Status post total nail avulsions, tendinitis  Plan: -All treatment options discussed with the patient including all alternatives, risks, complications.  -Debrided the left hallux preset nail distally any complications or bleeding.  Regular moisturizer to the nailbed. -The scab on the hallux nail is coming off.  The right side not quite ready to come off.  Continue Epson salt soaks as well as a small amount of antibiotic ointment. -Continue stretching, icing as well as Voltaren gel for the tendinitis -Monitor for any clinical signs or symptoms of infection and directed to call the office immediately should any occur or go to the ER. -Patient encouraged to call the office with any questions, concerns, change in symptoms.   Trula Slade DPM

## 2021-01-09 ENCOUNTER — Encounter: Payer: Self-pay | Admitting: Family Medicine

## 2021-01-09 ENCOUNTER — Ambulatory Visit (INDEPENDENT_AMBULATORY_CARE_PROVIDER_SITE_OTHER): Payer: Medicare Other | Admitting: Family Medicine

## 2021-01-09 ENCOUNTER — Ambulatory Visit: Payer: Medicare Other | Admitting: Family Medicine

## 2021-01-09 ENCOUNTER — Other Ambulatory Visit: Payer: Self-pay

## 2021-01-09 VITALS — BP 124/78 | HR 59 | Temp 98.3°F | Resp 17 | Ht 68.0 in | Wt 278.0 lb

## 2021-01-09 DIAGNOSIS — E785 Hyperlipidemia, unspecified: Secondary | ICD-10-CM

## 2021-01-09 DIAGNOSIS — Z23 Encounter for immunization: Secondary | ICD-10-CM

## 2021-01-09 DIAGNOSIS — I10 Essential (primary) hypertension: Secondary | ICD-10-CM | POA: Diagnosis not present

## 2021-01-09 DIAGNOSIS — Z1159 Encounter for screening for other viral diseases: Secondary | ICD-10-CM

## 2021-01-09 LAB — CBC WITH DIFFERENTIAL/PLATELET
Basophils Absolute: 0 10*3/uL (ref 0.0–0.1)
Basophils Relative: 0.8 % (ref 0.0–3.0)
Eosinophils Absolute: 0.1 10*3/uL (ref 0.0–0.7)
Eosinophils Relative: 2.2 % (ref 0.0–5.0)
HCT: 40.5 % (ref 36.0–46.0)
Hemoglobin: 13.7 g/dL (ref 12.0–15.0)
Lymphocytes Relative: 27.1 % (ref 12.0–46.0)
Lymphs Abs: 1 10*3/uL (ref 0.7–4.0)
MCHC: 33.8 g/dL (ref 30.0–36.0)
MCV: 98 fl (ref 78.0–100.0)
Monocytes Absolute: 0.3 10*3/uL (ref 0.1–1.0)
Monocytes Relative: 9.4 % (ref 3.0–12.0)
Neutro Abs: 2.1 10*3/uL (ref 1.4–7.7)
Neutrophils Relative %: 60.5 % (ref 43.0–77.0)
Platelets: 216 10*3/uL (ref 150.0–400.0)
RBC: 4.13 Mil/uL (ref 3.87–5.11)
RDW: 13.8 % (ref 11.5–15.5)
WBC: 3.5 10*3/uL — ABNORMAL LOW (ref 4.0–10.5)

## 2021-01-09 LAB — TSH: TSH: 3.77 u[IU]/mL (ref 0.35–5.50)

## 2021-01-09 LAB — BASIC METABOLIC PANEL
BUN: 16 mg/dL (ref 6–23)
CO2: 28 mEq/L (ref 19–32)
Calcium: 10 mg/dL (ref 8.4–10.5)
Chloride: 104 mEq/L (ref 96–112)
Creatinine, Ser: 0.75 mg/dL (ref 0.40–1.20)
GFR: 77.31 mL/min (ref >60.00)
Glucose, Bld: 88 mg/dL (ref 70–99)
Potassium: 3.8 mEq/L (ref 3.5–5.1)
Sodium: 139 mEq/L (ref 135–145)

## 2021-01-09 LAB — HEPATIC FUNCTION PANEL
ALT: 19 U/L (ref 0–35)
AST: 18 U/L (ref 0–37)
Albumin: 4.1 g/dL (ref 3.5–5.2)
Alkaline Phosphatase: 68 U/L (ref 39–117)
Bilirubin, Direct: 0.2 mg/dL (ref 0.0–0.3)
Total Bilirubin: 1.3 mg/dL — ABNORMAL HIGH (ref 0.2–1.2)
Total Protein: 6.6 g/dL (ref 6.0–8.3)

## 2021-01-09 LAB — LIPID PANEL
Cholesterol: 130 mg/dL (ref 0–200)
HDL: 53.2 mg/dL (ref >39.00)
LDL Cholesterol: 59 mg/dL (ref 0–99)
NonHDL: 76.37
Total CHOL/HDL Ratio: 2
Triglycerides: 85 mg/dL (ref 0.0–149.0)
VLDL: 17 mg/dL (ref 0.0–40.0)

## 2021-01-09 NOTE — Addendum Note (Signed)
Addended by: Genevie Cheshire L on: 01/09/2021 10:38 AM   Modules accepted: Orders

## 2021-01-09 NOTE — Assessment & Plan Note (Signed)
Chronic problem, tolerating statin w/o difficulty.  Check labs.  Adjust meds prn  

## 2021-01-09 NOTE — Assessment & Plan Note (Signed)
Deteriorated.  Pt has gained 4 lbs since last visit.  Admits to eating poorly, no regular physical activity.  Encouraged her to start walking daily.  Check labs to risk stratify.  Will follow.

## 2021-01-09 NOTE — Patient Instructions (Addendum)
Schedule your complete physical in 6 months We'll notify you of your lab results and make any changes if needed Try and make healthy food choices and add daily physical activity like walking. Call with any questions or concerns Stay Safe!  Stay Healthy! Happy Fall!!

## 2021-01-09 NOTE — Assessment & Plan Note (Addendum)
Chronic problem.  Well controlled on Losartan and HCTZ.  Check labs due to ARB and HCTZ.  No anticipated med changes.  Will follow.

## 2021-01-09 NOTE — Progress Notes (Signed)
   Subjective:    Patient ID: Shelby Reeves, female    DOB: October 17, 1945, 75 y.o.   MRN: [id number]  HPI HTN- chronic problem, on Losartan '50mg'$  daily, HCTZ 12.'5mg'$  daily w/ good control.  Denies CP, SOB, HAs, visual changes, edema  Hyperlipidemia- chronic problem, on Pravastatin '40mg'$  daily.  Denies abd pain, N/V  Obesity- pt has gained another 4 lbs.  BMI now 42.27  Not exercising, not following a particular diet.  'I know I don't eat right'.   Review of Systems For ROS see HPI   This visit occurred during the SARS-CoV-2 public health emergency.  Safety protocols were in place, including screening questions prior to the visit, additional usage of staff PPE, and extensive cleaning of exam room while observing appropriate contact time as indicated for disinfecting solutions.      Objective:   Physical Exam Vitals reviewed.  Constitutional:      General: She is not in acute distress.    Appearance: Normal appearance. She is well-developed. She is obese.  HENT:     Head: Normocephalic and atraumatic.  Eyes:     Conjunctiva/sclera: Conjunctivae normal.     Pupils: Pupils are equal, round, and reactive to light.  Neck:     Thyroid: No thyromegaly.  Cardiovascular:     Rate and Rhythm: Normal rate and regular rhythm.     Pulses: Normal pulses.     Heart sounds: Normal heart sounds. No murmur heard. Pulmonary:     Effort: Pulmonary effort is normal. No respiratory distress.     Breath sounds: Normal breath sounds.  Abdominal:     General: There is no distension.     Palpations: Abdomen is soft.     Tenderness: There is no abdominal tenderness.  Musculoskeletal:     Cervical back: Normal range of motion and neck supple.     Right lower leg: No edema.     Left lower leg: No edema.  Lymphadenopathy:     Cervical: No cervical adenopathy.  Skin:    General: Skin is warm and dry.  Neurological:     Mental Status: She is alert and oriented to person, place, and time.  Psychiatric:         Mood and Affect: Mood normal.        Behavior: Behavior normal.          Assessment & Plan:

## 2021-01-10 LAB — HEPATITIS C ANTIBODY
Hepatitis C Ab: NONREACTIVE
SIGNAL TO CUT-OFF: 0.02 (ref <1.00)

## 2021-01-11 ENCOUNTER — Telehealth: Payer: Medicare Other

## 2021-01-16 ENCOUNTER — Ambulatory Visit (INDEPENDENT_AMBULATORY_CARE_PROVIDER_SITE_OTHER): Payer: Medicare Other

## 2021-01-16 DIAGNOSIS — E785 Hyperlipidemia, unspecified: Secondary | ICD-10-CM

## 2021-01-16 DIAGNOSIS — I10 Essential (primary) hypertension: Secondary | ICD-10-CM

## 2021-01-16 NOTE — Patient Instructions (Signed)
Ms. Hodgkiss,  Thank you for talking with me today. I have included our care plan/goals in the following pages.   Please review and call me at [phone] with any questions.  Thanks! Ellin Mayhew, PharmD, CPP Clinical Pharmacist Practitioner  [phone]  Pharmacist Clinical Goal(s):  Patient will verbalize ability to afford treatment regimen through collaboration with PharmD and provider.   Interventions: 1:1 collaboration with [name], MD regarding development and update of comprehensive plan of care as evidenced by provider attestation and co-signature Inter-disciplinary care team collaboration (see longitudinal plan of care) Comprehensive medication review performed; medication list updated in electronic medical record  Hypertension (BP goal <130/80) -Controlled -Hypotension Hx -Current treatment: Losartan 50 mg once daily HCTZ 12.5 mg - 1-2 tablets once daily -Current home readings: at goal historically, has not taken recent but has cuff.  -Current dietary habits: 12/2020 - lots of processed foods, some salads a few times a week w/ ranch. Cookies/sweets can be hard to avoid. Minimal fruits and vegetables  -Reviewed silver snickers, stationary bike, going to the [facility] for water aerobics as options for exercise.  -Denies recent hypotensive/hypertensive symptoms, no recent falls -Exercise goal of 150 minutes per week -No issues with compliance/no fill gaps noted -Recommended to continue current medication  Hyperlipidemia: (LDL goal < 100) -Controlled -Current treatment: Pravastatin 40 mg once daily  -No gaps noted -Side effect review - no problems noted  -Educated on Cholesterol goals -Recommended to continue current medication  Patient Goals/Self-Care Activities Patient will:  - take medications as prescribed target a minimum of 150 minutes of moderate intensity exercise weekly  Medication Assistance: None required.  Patient affirms current  coverage meets needs. Patient Goals/Self-Care Activities Patient will:  - take medications as prescribed target a minimum of 150 minutes of moderate intensity exercise weekly engage in dietary modifications by reducing sweets, foods high in fat, less processed foods  Medication Assistance: None required.  Patient affirms current coverage meets needs.  The patient verbalized understanding of instructions provided today and agreed to receive a MyChart copy of patient instruction and/or educational materials. Telephone follow up appointment with pharmacy team member scheduled for: See next appointment with "Care Management Staff" under "What's Next" below.

## 2021-01-16 NOTE — Progress Notes (Signed)
Chronic Care Management Pharmacy Note  01/16/2021 Name:  Shelby Reeves MRN:  [id number] DOB:  01/14/1946  Summary: No changes  Subjective: Shelby Reeves is an 75 y.o. year old female who is a primary patient of Tabori, Aundra Millet, MD.  The CCM team was consulted for assistance with disease management and care coordination needs.    Engaged with patient by telephone for follow up visit in response to provider referral for pharmacy case management and/or care coordination services.   Consent to Services:  The patient was given information about Chronic Care Management services, agreed to services, and gave verbal consent prior to initiation of services.  Please see initial visit note for detailed documentation.   Patient Care Team: [name], MD as PCP - General Ladene Artist, MD as Consulting Physician (Gastroenterology) Jerrell Belfast, MD as Consulting Physician (Otolaryngology) Konrad Felix, MD as Referring Physician (Ophthalmology) Madelin Rear, [facility] as Pharmacist (Pharmacist)  Hospital visits: None in previous 6 months  Objective:  Lab Results  Component Value Date   CREATININE 0.75 01/09/2021   CREATININE 0.69 07/09/2020   CREATININE 0.82 06/15/2020    Lab Results  Component Value Date   HGBA1C 6.0 02/14/2016   HGBA1C 5.9 (H) 02/09/2015   HGBA1C 5.9 09/24/2012   Last diabetic Eye exam: No results found for: HMDIABEYEEXA  Last diabetic Foot exam: No results found for: HMDIABFOOTEX      Component Value Date/Time   CHOL 130 01/09/2021 1026   CHOL 115 07/09/2020 1057   CHOL 133 01/10/2020 1018   TRIG 85.0 01/09/2021 1026   TRIG 79.0 07/09/2020 1057   TRIG 102.0 01/10/2020 1018   HDL 53.20 01/09/2021 1026   HDL 44.10 07/09/2020 1057   HDL 50.10 01/10/2020 1018   CHOLHDL 2 01/09/2021 1026   VLDL 17.0 01/09/2021 1026   LDLCALC 59 01/09/2021 1026   LDLCALC 55 07/09/2020 1057   LDLCALC 62 01/10/2020 1018    Hepatic Function Latest Ref Rng  & Units 01/09/2021 07/09/2020 06/15/2020  Total Protein 6.0 - 8.3 g/dL 6.6 7.2 7.2  Albumin 3.5 - 5.2 g/dL 4.1 4.0 4.2  AST 0 - 37 U/L 18 20 24   ALT 0 - 35 U/L 19 21 23   Alk Phosphatase 39 - 117 U/L 68 68 68  Total Bilirubin 0.2 - 1.2 mg/dL 1.3(H) 0.9 1.3(H)  Bilirubin, Direct 0.0 - 0.3 mg/dL 0.2 0.1 0.2    Lab Results  Component Value Date/Time   TSH 3.77 01/09/2021 10:26 AM   TSH 2.68 07/09/2020 10:57 AM    CBC Latest Ref Rng & Units 01/09/2021 07/09/2020 04/30/2020  WBC 4.0 - 10.5 K/uL 3.5(L) 2.8(L) 3.9  Hemoglobin 12.0 - 15.0 g/dL 13.7 14.0 14.1  Hematocrit 36.0 - 46.0 % 40.5 40.9 40.4  Platelets 150.0 - 400.0 K/uL 216.0 264.0 241    No results found for: VD25OH  Clinical ASCVD:  The 10-year ASCVD risk score (Arnett DK, et al., 2019) is: 21.5%   Values used to calculate the score:     Age: 57 years     Sex: Female     Is Non-Hispanic African American: No     Diabetic: No     Tobacco smoker: No     Systolic Blood Pressure: 315 mmHg     Is BP treated: Yes     HDL Cholesterol: 53.2 mg/dL     Total Cholesterol: 130 mg/dL   Social History   Tobacco Use  Smoking Status Never  Smokeless  Tobacco Never   BP Readings from Last 3 Encounters:  01/09/21 124/78  12/10/20 130/68  07/09/20 130/85   Pulse Readings from Last 3 Encounters:  01/09/21 (!) 59  12/10/20 69  07/09/20 60   Wt Readings from Last 3 Encounters:  01/09/21 278 lb (126.1 kg)  12/10/20 274 lb (124.3 kg)  07/09/20 269 lb 6.4 oz (122.2 kg)   BMI Readings from Last 3 Encounters:  01/09/21 42.27 kg/m  12/10/20 41.66 kg/m  07/09/20 40.96 kg/m   Assessment: Review of patient past medical history, allergies, medications, health status, including review of consultants reports, laboratory and other test data, was performed as part of comprehensive evaluation and provision of chronic care management services.   SDOH:  (Social Determinants of Health) assessments and interventions performed: Yes   CCM Care  Plan  Allergies  Allergen Reactions   Iodine Hives   Shellfish Allergy Hives    Medications Reviewed Today     Reviewed by [name], MD (Physician) on 01/09/21 at [phone]  Med List Status: <None>   Medication Order Taking? Sig Documenting Provider Last Dose Status Informant  aspirin 81 MG tablet [id number] Yes Take 81 mg by mouth daily. [provider] Taking Active   Efinaconazole 10 % SOLN [id number] No Apply 1 drop topically daily.  Patient not taking: Reported on 01/09/2021   Trula Slade, DPM Not Taking Active   fluconazole (DIFLUCAN) 150 MG tablet [id number] No Take 1 tablet (150 mg total) by mouth once a week.  Patient not taking: Reported on 01/09/2021   Trula Slade, DPM Not Taking Active   hydrochlorothiazide (HYDRODIURIL) 12.5 MG tablet [id number] Yes TAKE 1 TO 2 TABLETS BY MOUTH DAILY DEPENDING ON SWELLING [name], MD Taking Active     Discontinued 07/21/11 1338 (Side effect (s))   losartan (COZAAR) 50 MG tablet [id number] Yes TAKE 1 TABLET(50 MG) BY MOUTH DAILY [name], MD Taking Active   Melatonin 5 MG CAPS [id number] Yes Take by mouth. [provider] Taking Active   Multiple Vitamin (MULTIVITAMIN) tablet [id number] Yes Take 1 tablet by mouth daily. [provider] Taking Active   neomycin-polymyxin b-dexamethasone (MAXITROL) 3.5-10000-0.1 OINT [id number] No   Patient not taking: Reported on 01/09/2021   [provider] Not Taking Active   pantoprazole (PROTONIX) 40 MG tablet [id number] Yes Take 1 tablet (40 mg total) by mouth daily. [name], MD Taking Active   pravastatin (PRAVACHOL) 40 MG tablet [id number] Yes TAKE 1 TABLET(40 MG) BY MOUTH DAILY [name], MD Taking Active             Patient Active Problem List   Diagnosis Date Noted   Cyst of finger 10/03/2015   Severe obesity (BMI >= 40) ([place]) 12/07/2013   Ventral hernia 02/25/2013   Paresthesia of left arm 09/23/2012    Generalized anxiety disorder 09/23/2012   General medical examination 02/12/2011   Back pain 08/27/2010   ORTHOSTATIC HYPOTENSION 06/28/2010   VERTIGO 06/28/2010   Hyperlipidemia 05/06/2010   Essential hypertension 05/06/2010   RHINITIS 05/06/2010    Immunization History  Administered Date(s) Administered   Fluad Quad(high Dose 65+) 12/31/2018, 01/10/2020, 01/09/2021   Influenza Split 02/12/2011, 04/29/2012   Influenza, High Dose Seasonal PF 02/18/2017, 01/27/2018   Influenza,inj,Quad PF,6+ Mos 01/26/2013, 01/05/2014, 02/14/2016   PFIZER(Purple Top)SARS-COV-2 Vaccination 05/20/2019, 06/08/2019   Pneumococcal Conjugate-13 02/07/2014   Pneumococcal Polysaccharide-23 03/17/2008, 08/13/2016   Tdap 12/31/2018   Zoster Recombinat (Shingrix) 04/03/2017, 07/20/2017  Conditions to be addressed/monitored: HLD HTN Obesity   There are no care plans that you recently modified to display for this patient. Pharmacist Clinical Goal(s):  Patient will verbalize ability to afford treatment regimen through collaboration with PharmD and provider.   Interventions: 1:1 collaboration with [name], MD regarding development and update of comprehensive plan of care as evidenced by provider attestation and co-signature Inter-disciplinary care team collaboration (see longitudinal plan of care) Comprehensive medication review performed; medication list updated in electronic medical record  Hypertension (BP goal <130/80) -Controlled -Hypotension Hx -Current treatment: Losartan 50 mg once daily HCTZ 12.5 mg - 1-2 tablets once daily -Current home readings: at goal historically, has not taken recent but has cuff.  -Current dietary habits: 12/2020 - lots of processed foods, some salads a few times a week w/ ranch. Cookies/sweets can be hard to avoid. Minimal fruits and vegetables  -Reviewed silver snickers, stationary bike, going to the [facility] for water aerobics as options for exercise.   -Denies recent hypotensive/hypertensive symptoms, no recent falls -Exercise goal of 150 minutes per week -No issues with compliance/no fill gaps noted -Recommended to continue current medication  Hyperlipidemia: (LDL goal < 100) -Controlled -Current treatment: Pravastatin 40 mg once daily  -No gaps noted -Side effect review - no problems noted  -Educated on Cholesterol goals -Recommended to continue current medication  Patient Goals/Self-Care Activities Patient will:  - take medications as prescribed target a minimum of 150 minutes of moderate intensity exercise weekly  Medication Assistance: None required.  Patient affirms current coverage meets needs. Patient Goals/Self-Care Activities Patient will:  - take medications as prescribed target a minimum of 150 minutes of moderate intensity exercise weekly engage in dietary modifications by reducing sweets, foods high in fat, less processed foods  Medication Assistance: None required.  Patient affirms current coverage meets needs.  Patient's preferred pharmacy is:  [facility] [place], [place] [address] [place] [address] Phone: [phone] Fax: [place], [address] [place] [address] Phone: [phone] Fax: [phone]  Pt endorses 100% compliance  Follow Up:  Patient agrees to Care Plan and Follow-up. Plan: HC 1 month pt call on diet/exercise changes. Pharmacist 8 month f/u telephone appt.  Future Appointments  Date Time Provider [place]  07/10/2021  9:00 AM [name], MD [facility]  09/18/2021  2:00 PM [facility] PHARMACIST [facility]   Madelin Rear, PharmD, CPP Clinical Pharmacist Practitioner  [place]  [phone]

## 2021-01-25 DIAGNOSIS — I10 Essential (primary) hypertension: Secondary | ICD-10-CM | POA: Diagnosis not present

## 2021-01-25 DIAGNOSIS — E785 Hyperlipidemia, unspecified: Secondary | ICD-10-CM

## 2021-01-31 ENCOUNTER — Telehealth: Payer: Self-pay

## 2021-01-31 NOTE — Progress Notes (Signed)
    Chronic Care Management Pharmacy Assistant   Name: JANANI CHAMBER  MRN: [id number] DOB: [birth year]   Reason for Encounter: Disease State - General Adherence Call    Recent office visits:  None noted.   Recent consult visits:  None noted.   Hospital visits:  None in previous 6 months  Medications: Outpatient Encounter Medications as of 01/31/2021  Medication Sig   aspirin 81 MG tablet Take 81 mg by mouth daily.   hydrochlorothiazide (HYDRODIURIL) 12.5 MG tablet TAKE 1 TO 2 TABLETS BY MOUTH DAILY DEPENDING ON SWELLING   losartan (COZAAR) 50 MG tablet TAKE 1 TABLET(50 MG) BY MOUTH DAILY   Melatonin 5 MG CAPS Take by mouth.   Multiple Vitamin (MULTIVITAMIN) tablet Take 1 tablet by mouth daily.   pantoprazole (PROTONIX) 40 MG tablet Take 1 tablet (40 mg total) by mouth daily.   pravastatin (PRAVACHOL) 40 MG tablet TAKE 1 TABLET(40 MG) BY MOUTH DAILY   [DISCONTINUED] lisinopril-hydrochlorothiazide (PRINZIDE,ZESTORETIC) 10-12.5 MG per tablet Take 1 tablet by mouth daily.   No facility-administered encounter medications on file as of 01/31/2021.    Have you had any problems recently with your health? Patient denied any recent issues with her health.   Have you had any problems with your pharmacy? Patient denied any problems with her current pharmacy.  What issues or side effects are you having with your medications? Patient denied any issues or side effects with her current medication.   What would you like me to pass along to Madelin Rear, CPP for them to help you with?  Patient did not have anything to pass along to CPP at this time.   What can we do to take care of you better? Patient did not have any recommendation at this time.  Care Gaps  AWV: done 11/05/20  Colonoscopy: done 04/08/18 DM Eye Exam: done 08/20/20 DM Foot Exam: N/A Microalbumin: N/A HbgAIC: N/A DEXA: done 09/06/18 Mammogram: done 11/07/19   Star Rating Drugs: Pravastatin (PRAVACHOL) 40 MG tablet -  last filled 11/20/20 90 days  Losartan (COZAAR) 50 MG tablet - last filled 01/15/21 90 days   Future Appointments  Date Time Provider [place]  07/10/2021  9:00 AM [name], MD [facility]  09/18/2021  2:00 PM [facility] PHARMACIST [facility] [place] [place] Clinical Pharmacist Assistant  [phone]  Time Spent: 35 minutes

## 2021-04-16 ENCOUNTER — Encounter: Payer: Self-pay | Admitting: Family Medicine

## 2021-04-18 ENCOUNTER — Other Ambulatory Visit: Payer: Self-pay | Admitting: Family Medicine

## 2021-04-18 DIAGNOSIS — I10 Essential (primary) hypertension: Secondary | ICD-10-CM

## 2021-05-06 ENCOUNTER — Telehealth: Payer: Medicare Other | Admitting: Emergency Medicine

## 2021-05-06 ENCOUNTER — Telehealth: Payer: Self-pay

## 2021-05-06 DIAGNOSIS — U071 COVID-19: Secondary | ICD-10-CM | POA: Diagnosis not present

## 2021-05-06 MED ORDER — MOLNUPIRAVIR EUA 200MG CAPSULE: 4.0000 | ORAL_CAPSULE | Freq: Two times a day (BID) | ORAL | 0 refills | Status: AC

## 2021-05-06 NOTE — Patient Instructions (Signed)
Shelby Reeves, thank you for joining Carvel Getting, NP for today's virtual visit.  While this provider is not your primary care provider (PCP), if your PCP is located in our provider database this encounter information will be shared with them immediately following your visit.  Consent: (Patient) Shelby Reeves provided verbal consent for this virtual visit at the beginning of the encounter.  Current Medications:  Current Outpatient Medications:    molnupiravir EUA (LAGEVRIO) 200 mg CAPS capsule, Take 4 capsules (800 mg total) by mouth 2 (two) times daily for 5 days., Disp: 40 capsule, Rfl: 0   aspirin 81 MG tablet, Take 81 mg by mouth daily., Disp: , Rfl:    hydrochlorothiazide (HYDRODIURIL) 12.5 MG tablet, TAKE 1 TO 2 TABLETS BY MOUTH DAILY DEPENDING ON SWELLING, Disp: 180 tablet, Rfl: 1   losartan (COZAAR) 50 MG tablet, TAKE 1 TABLET(50 MG) BY MOUTH DAILY, Disp: 90 tablet, Rfl: 1   Melatonin 5 MG CAPS, Take by mouth., Disp: , Rfl:    Multiple Vitamin (MULTIVITAMIN) tablet, Take 1 tablet by mouth daily., Disp: , Rfl:    pantoprazole (PROTONIX) 40 MG tablet, Take 1 tablet (40 mg total) by mouth daily., Disp: 90 tablet, Rfl: 1   pravastatin (PRAVACHOL) 40 MG tablet, TAKE 1 TABLET(40 MG) BY MOUTH DAILY, Disp: 90 tablet, Rfl: 1   Medications ordered in this encounter:  Meds ordered this encounter  Medications   molnupiravir EUA (LAGEVRIO) 200 mg CAPS capsule    Sig: Take 4 capsules (800 mg total) by mouth 2 (two) times daily for 5 days.    Dispense:  40 capsule    Refill:  0     *If you need refills on other medications prior to your next appointment, please contact your pharmacy*  Follow-Up: Call back or seek an in-person evaluation if the symptoms worsen or if the condition fails to improve as anticipated.  Other Instructions Please keep well-hydrated and get plenty of rest. Start a saline nasal rinse to flush out your nasal passages. You can use plain Mucinex to help thin  congestion. Please take prescribed medications as directed.  You have been enrolled in a MyChart symptom monitoring program. Please answer these questions daily so we can keep track of how you are doing.  You were to quarantine for 5 days from onset of your symptoms.  After day 5, if you have had no fever and you are feeling better, you can end quarantine but need to mask for an additional 5 days. After day 5 if you have a fever or are having significant symptoms, please quarantine for full 10 days.  If you note any worsening of symptoms, any significant shortness of breath or any chest pain, please seek ER evaluation ASAP.  Please do not delay care!  COVID-19: What to Do if You Are Sick If you test positive and are an older adult or someone who is at high risk of getting very sick from COVID-19, treatment may be available. Contact a healthcare provider right away after a positive test to determine if you are eligible, even if your symptoms are mild right now. You can also visit a Test to Treat location and, if eligible, receive a prescription from a provider. Don't delay: Treatment must be started within the first few days to be effective. If you have a fever, cough, or other symptoms, you might have COVID-19. Most people have mild illness and are able to recover at home. If you are sick: Keep  track of your symptoms. If you have an emergency warning sign (including trouble breathing), call 911. Steps to help prevent the spread of COVID-19 if you are sick If you are sick with COVID-19 or think you might have COVID-19, follow the steps below to care for yourself and to help protect other people in your home and community. Stay home except to get medical care Stay home. Most people with COVID-19 have mild illness and can recover at home without medical care. Do not leave your home, except to get medical care. Do not visit public areas and do not go to places where you are unable to wear a  mask. Take care of yourself. Get rest and stay hydrated. Take over-the-counter medicines, such as acetaminophen, to help you feel better. Stay in touch with your doctor. Call before you get medical care. Be sure to get care if you have trouble breathing, or have any other emergency warning signs, or if you think it is an emergency. Avoid public transportation, ride-sharing, or taxis if possible. Get tested If you have symptoms of COVID-19, get tested. While waiting for test results, stay away from others, including staying apart from those living in your household. Get tested as soon as possible after your symptoms start. Treatments may be available for people with COVID-19 who are at risk for becoming very sick. Don't delay: Treatment must be started early to be effective--some treatments must begin within 5 days of your first symptoms. Contact your healthcare provider right away if your test result is positive to determine if you are eligible. Self-tests are one of several options for testing for the virus that causes COVID-19 and may be more convenient than laboratory-based tests and point-of-care tests. Ask your healthcare provider or your local health department if you need help interpreting your test results. You can visit your state, tribal, local, and territorial health department's website to look for the latest local information on testing sites. Separate yourself from other people As much as possible, stay in a specific room and away from other people and pets in your home. If possible, you should use a separate bathroom. If you need to be around other people or animals in or outside of the home, wear a well-fitting mask. Tell your close contacts that they may have been exposed to COVID-19. An infected person can spread COVID-19 starting 48 hours (or 2 days) before the person has any symptoms or tests positive. By letting your close contacts know they may have been exposed to COVID-19, you are  helping to protect everyone. See COVID-19 and Animals if you have questions about pets. If you are diagnosed with COVID-19, someone from the health department may call you. Answer the call to slow the spread. Monitor your symptoms Symptoms of COVID-19 include fever, cough, or other symptoms. Follow care instructions from your healthcare provider and local health department. Your local health authorities may give instructions on checking your symptoms and reporting information. When to seek emergency medical attention Look for emergency warning signs* for COVID-19. If someone is showing any of these signs, seek emergency medical care immediately: Trouble breathing Persistent pain or pressure in the chest New confusion Inability to wake or stay awake Pale, gray, or blue-colored skin, lips, or nail beds, depending on skin tone *This list is not all possible symptoms. Please call your medical provider for any other symptoms that are severe or concerning to you. Call 911 or call ahead to your local emergency facility: Notify the operator that  you are seeking care for someone who has or may have COVID-19. Call ahead before visiting your doctor Call ahead. Many medical visits for routine care are being postponed or done by phone or telemedicine. If you have a medical appointment that cannot be postponed, call your doctor's office, and tell them you have or may have COVID-19. This will help the office protect themselves and other patients. If you are sick, wear a well-fitting mask You should wear a mask if you must be around other people or animals, including pets (even at home). Wear a mask with the best fit, protection, and comfort for you. You don't need to wear the mask if you are alone. If you can't put on a mask (because of trouble breathing, for example), cover your coughs and sneezes in some other way. Try to stay at least 6 feet away from other people. This will help protect the people around  you. Masks should not be placed on young children under age 69 years, anyone who has trouble breathing, or anyone who is not able to remove the mask without help. Cover your coughs and sneezes Cover your mouth and nose with a tissue when you cough or sneeze. Throw away used tissues in a lined trash can. Immediately wash your hands with soap and water for at least 20 seconds. If soap and water are not available, clean your hands with an alcohol-based hand sanitizer that contains at least 60% alcohol. Clean your hands often Wash your hands often with soap and water for at least 20 seconds. This is especially important after blowing your nose, coughing, or sneezing; going to the bathroom; and before eating or preparing food. Use hand sanitizer if soap and water are not available. Use an alcohol-based hand sanitizer with at least 60% alcohol, covering all surfaces of your hands and rubbing them together until they feel dry. Soap and water are the best option, especially if hands are visibly dirty. Avoid touching your eyes, nose, and mouth with unwashed hands. Handwashing Tips Avoid sharing personal household items Do not share dishes, drinking glasses, cups, eating utensils, towels, or bedding with other people in your home. Wash these items thoroughly after using them with soap and water or put in the dishwasher. Clean surfaces in your home regularly Clean and disinfect high-touch surfaces (for example, doorknobs, tables, handles, light switches, and countertops) in your "sick room" and bathroom. In shared spaces, you should clean and disinfect surfaces and items after each use by the person who is ill. If you are sick and cannot clean, a caregiver or other person should only clean and disinfect the area around you (such as your bedroom and bathroom) on an as needed basis. Your caregiver/other person should wait as long as possible (at least several hours) and wear a mask before entering, cleaning, and  disinfecting shared spaces that you use. Clean and disinfect areas that may have blood, stool, or body fluids on them. Use household cleaners and disinfectants. Clean visible dirty surfaces with household cleaners containing soap or detergent. Then, use a household disinfectant. Use a product from [employer] List N: Disinfectants for Coronavirus (CNOBS-96). Be sure to follow the instructions on the label to ensure safe and effective use of the product. Many products recommend keeping the surface wet with a disinfectant for a certain period of time (look at "contact time" on the product label). You may also need to wear personal protective equipment, such as gloves, depending on the directions on the product label.  Immediately after disinfecting, wash your hands with soap and water for 20 seconds. For completed guidance on cleaning and disinfecting your home, visit Complete Disinfection Guidance. Take steps to improve ventilation at home Improve ventilation (air flow) at home to help prevent from spreading COVID-19 to other people in your household. Clear out COVID-19 virus particles in the air by opening windows, using air filters, and turning on fans in your home. Use this interactive tool to learn how to improve air flow in your home. When you can be around others after being sick with COVID-19 Deciding when you can be around others is different for different situations. Find out when you can safely end home isolation. For any additional questions about your care, contact your healthcare provider or state or local health department. 07/17/2020 Content source: [facility] ([facility]), Division of Viral Diseases This information is not intended to replace advice given to you by your health care provider. Make sure you discuss any questions you have with your health care provider. Document Revised: 08/30/2020 Document Reviewed: 08/30/2020 Elsevier Patient  Education  2022 [employer].      If you have been instructed to have an in-person evaluation today at a local Urgent Care facility, please use the link below. It will take you to a list of all of our available [facility], including address, phone number and hours of operation. Please do not delay care.  [facility]  If you or a family member do not have a primary care provider, use the link below to schedule a visit and establish care. When you choose a [facility] primary care physician or advanced practice provider, you gain a long-term partner in health. Find a Primary Care Provider  Learn more about [facility]'s in-office and virtual care options: [facility]

## 2021-05-06 NOTE — Progress Notes (Signed)
Virtual Visit Consent   [name], you are scheduled for a virtual visit with a [facility] provider today.     Just as with appointments in the office, your consent must be obtained to participate.  Your consent will be active for this visit and any virtual visit you may have with one of our providers in the next 365 days.     If you have a MyChart account, a copy of this consent can be sent to you electronically.  All virtual visits are billed to your insurance company just like a traditional visit in the office.    As this is a virtual visit, video technology does not allow for your provider to perform a traditional examination.  This may limit your provider's ability to fully assess your condition.  If your provider identifies any concerns that need to be evaluated in person or the need to arrange testing (such as labs, EKG, etc.), we will make arrangements to do so.     Although advances in technology are sophisticated, we cannot ensure that it will always work on either your end or our end.  If the connection with a video visit is poor, the visit may have to be switched to a telephone visit.  With either a video or telephone visit, we are not always able to ensure that we have a secure connection.     I need to obtain your verbal consent now.   Are you willing to proceed with your visit today?    Cristy Colmenares Kipper has provided verbal consent on 05/06/2021 for a virtual visit (video or telephone).   Carvel Getting, NP   Date: 05/06/2021 12:52 PM   Virtual Visit via Video Note   I, Carvel Getting, connected with  Shelby Reeves  ([id number], 1945/05/22) on 05/06/21 at 12:45 PM EST by a video-enabled telemedicine application and verified that I am speaking with the correct person using two identifiers.  Location: Patient: Virtual Visit Location Patient: Home Provider: Virtual Visit Location Provider: Home Office   I discussed the limitations of evaluation and management by telemedicine  and the availability of in person appointments. The patient expressed understanding and agreed to proceed.    History of Present Illness: Shelby Reeves is a 76 y.o. who identifies as a female who was assigned female at birth, and is being seen today for COVID.  Patient reports starting symptoms on 05/04/2020 and tested positive for COVID at home later that night.  She has been sick since then with a cough, runny nose, headache, sore throat, fever, chills, body aches.  She denies shortness of breath.  She has been using Tylenol and honey to treat her symptoms.  HPI: HPI  Problems:  Patient Active Problem List   Diagnosis Date Noted   Cyst of finger 10/03/2015   Severe obesity (BMI >= 40) ([place]) 12/07/2013   Ventral hernia 02/25/2013   Paresthesia of left arm 09/23/2012   Generalized anxiety disorder 09/23/2012   General medical examination 02/12/2011   Back pain 08/27/2010   ORTHOSTATIC HYPOTENSION 06/28/2010   VERTIGO 06/28/2010   Hyperlipidemia 05/06/2010   Essential hypertension 05/06/2010   RHINITIS 05/06/2010    Allergies:  Allergies  Allergen Reactions   Iodine Hives   Shellfish Allergy Hives   Medications:  Current Outpatient Medications:    molnupiravir EUA (LAGEVRIO) 200 mg CAPS capsule, Take 4 capsules (800 mg total) by mouth 2 (two) times daily for 5 days., Disp: 40 capsule,  Rfl: 0   aspirin 81 MG tablet, Take 81 mg by mouth daily., Disp: , Rfl:    hydrochlorothiazide (HYDRODIURIL) 12.5 MG tablet, TAKE 1 TO 2 TABLETS BY MOUTH DAILY DEPENDING ON SWELLING, Disp: 180 tablet, Rfl: 1   losartan (COZAAR) 50 MG tablet, TAKE 1 TABLET(50 MG) BY MOUTH DAILY, Disp: 90 tablet, Rfl: 1   Melatonin 5 MG CAPS, Take by mouth., Disp: , Rfl:    Multiple Vitamin (MULTIVITAMIN) tablet, Take 1 tablet by mouth daily., Disp: , Rfl:    pantoprazole (PROTONIX) 40 MG tablet, Take 1 tablet (40 mg total) by mouth daily., Disp: 90 tablet, Rfl: 1   pravastatin (PRAVACHOL) 40 MG tablet, TAKE 1 TABLET(40  MG) BY MOUTH DAILY, Disp: 90 tablet, Rfl: 1  Observations/Objective: Patient is well-developed, well-nourished in no acute distress.  Resting comfortably  at home.  Head is normocephalic, atraumatic.  No labored breathing.  Speech is clear and coherent with logical content.  Patient is alert and oriented at baseline.    Assessment and Plan: 1. COVID-19  Prescribed molnupiravir.  Recommended Mucinex, nasal saline spray.  Recommended she continue the Tylenol and honey as needed.  Discussed reasons for seeking emergency care including shortness of breath.  Follow Up Instructions: I discussed the assessment and treatment plan with the patient. The patient was provided an opportunity to ask questions and all were answered. The patient agreed with the plan and demonstrated an understanding of the instructions.  A copy of instructions were sent to the patient via MyChart unless otherwise noted below.   The patient was advised to call back or seek an in-person evaluation if the symptoms worsen or if the condition fails to improve as anticipated.  Time:  I spent 10 minutes with the patient via telehealth technology discussing the above problems/concerns.    Carvel Getting, NP

## 2021-05-06 NOTE — Telephone Encounter (Signed)
Patient needs an appointment . Please schedule.

## 2021-05-06 NOTE — Telephone Encounter (Signed)
Caller name:[name]   On DPR? :Yes  Call back number:[phone]  Provider they see: Birdie Riddle   Reason for call:Pt is wanting to know if she can get medication for a antiviral for Covid? Pt is very congested and Coughing

## 2021-05-20 ENCOUNTER — Other Ambulatory Visit: Payer: Self-pay | Admitting: Family Medicine

## 2021-05-20 DIAGNOSIS — I10 Essential (primary) hypertension: Secondary | ICD-10-CM

## 2021-05-20 DIAGNOSIS — E785 Hyperlipidemia, unspecified: Secondary | ICD-10-CM

## 2021-06-12 ENCOUNTER — Telehealth: Payer: Self-pay | Admitting: Pharmacist

## 2021-06-12 NOTE — Progress Notes (Signed)
° ° °  Chronic Care Management Pharmacy Assistant   Name: Shelby Reeves  MRN: [id number] DOB: 1945/11/08   Reason for Encounter: Disease State - General Adherence Call     Recent office visits:  05/06/21 Willeen Cass, NP - Family Medicine - COVID 19 - Molnupiravir EUA (LAGEVRIO) 200 mg CAPS capsule  Recent consult visits:  None noted.  Hospital visits:  None in previous 6 months  Medications: Outpatient Encounter Medications as of 06/12/2021  Medication Sig   aspirin 81 MG tablet Take 81 mg by mouth daily.   hydrochlorothiazide (HYDRODIURIL) 12.5 MG tablet TAKE 1 TO 2 TABLETS BY MOUTH EVERY DAY DEPENDING ON SWELLING   losartan (COZAAR) 50 MG tablet TAKE 1 TABLET(50 MG) BY MOUTH DAILY   Melatonin 5 MG CAPS Take by mouth.   Multiple Vitamin (MULTIVITAMIN) tablet Take 1 tablet by mouth daily.   pantoprazole (PROTONIX) 40 MG tablet Take 1 tablet (40 mg total) by mouth daily.   pravastatin (PRAVACHOL) 40 MG tablet TAKE 1 TABLET(40 MG) BY MOUTH DAILY   [DISCONTINUED] lisinopril-hydrochlorothiazide (PRINZIDE,ZESTORETIC) 10-12.5 MG per tablet Take 1 tablet by mouth daily.   No facility-administered encounter medications on file as of 06/12/2021.    Have you had any problems recently with your health? Patient reported she has been doing well. She did have a slight infection in her right ear the audiologist did mention yesterday.   Have you had any problems with your pharmacy? Patient denied any issues with her current pharmacy.   What issues or side effects are you having with your medications? Patient denied any issues or side effects with her current medications.   What would you like me to pass along to [name], CPP for them to help you with?  Patient states she needs a rx sent for Amoxicillin. She states he was seen yesterday by audiologist and they cleaned out her left ear but states she had a slight infection in her right ear and may need an antibiotic. She asked to have CPP  send a message to PCP to see if this could be done for her. She states she also called and left a message at Tabori's office but has not heard back.   What can we do to take care of you better? Patient just would like to see if PCP would send in the antibiotic for her ear infection mentioned by her audiologist yesterday.   Care Gaps  AWV: done 11/05/20 Colonoscopy: done 04/08/18 DM Eye Exam: N/A DM Foot Exam: N/A Microalbumin: N/A HbgAIC: N/A DEXA: done 09/06/18 Mammogram: done 11/07/19  Star Rating Drugs: losartan (COZAAR) 50 MG tablet - last filled 04/18/21 90 days  pravastatin (PRAVACHOL) 40 MG tablet - last filled 02/18/21 90 days    Future Appointments  Date Time Provider [place]  07/10/2021  9:00 AM [name], MD [facility]  09/18/2021  2:00 PM [facility] PHARMACIST [facility] [place], [place] Clinical Pharmacist Assistant  [phone]

## 2021-06-24 ENCOUNTER — Encounter: Payer: Self-pay | Admitting: Family Medicine

## 2021-06-26 ENCOUNTER — Ambulatory Visit (INDEPENDENT_AMBULATORY_CARE_PROVIDER_SITE_OTHER): Payer: Medicare Other | Admitting: Family Medicine

## 2021-06-26 ENCOUNTER — Encounter: Payer: Self-pay | Admitting: Family Medicine

## 2021-06-26 VITALS — BP 128/70 | HR 62 | Temp 98.5°F | Resp 16 | Wt 284.0 lb

## 2021-06-26 DIAGNOSIS — H60501 Unspecified acute noninfective otitis externa, right ear: Secondary | ICD-10-CM

## 2021-06-26 MED ORDER — CIPROFLOXACIN-DEXAMETHASONE 0.3-0.1 % OT SUSP: 4.0000 [drp] | Freq: Two times a day (BID) | OTIC | 0 refills | Status: DC

## 2021-06-26 MED ORDER — PHENTERMINE HCL 37.5 MG PO CAPS: 37.5000 mg | ORAL_CAPSULE | ORAL | 0 refills | Status: DC

## 2021-06-26 NOTE — Progress Notes (Signed)
? ?  Subjective:  ? ? Patient ID: Shelby Reeves, female    DOB: October 26, 1945, 76 y.o.   MRN: [id number] ? ?HPI ?'Ear infxn'- pt reports she has had decreased hearing on R side for a few weeks.  Went to audiologist yesterday who told her ear was infected.  Denies pain.  Pt reports some drainage.  No issues w/ L ear.  States she can hear it 'bubble'.  No fevers. ? ?Obesity- pt is very unhappy that she has gained additional weight.  Would like to restart the phentermine that she has had in the past.  Admits to poor diet when she had COVID and little to no regular exercise. ? ? ?Review of Systems ?For ROS see HPI  ? ?This visit occurred during the SARS-CoV-2 public health emergency.  Safety protocols were in place, including screening questions prior to the visit, additional usage of staff PPE, and extensive cleaning of exam room while observing appropriate contact time as indicated for disinfecting solutions.   ?   ?Objective:  ? Physical Exam ?Vitals reviewed.  ?Constitutional:   ?   General: She is not in acute distress. ?   Appearance: Normal appearance. She is obese. She is not ill-appearing.  ?HENT:  ?   Head: Normocephalic and atraumatic.  ?   Right Ear: Tympanic membrane normal.  ?   Left Ear: Tympanic membrane, ear canal and external ear normal.  ?   Ears:  ?   Comments: R EAC w/ crusted debris and erythema ?   Nose: Nose normal. No congestion.  ?Eyes:  ?   Extraocular Movements: Extraocular movements intact.  ?   Conjunctiva/sclera: Conjunctivae normal.  ?   Pupils: Pupils are equal, round, and reactive to light.  ?Cardiovascular:  ?   Rate and Rhythm: Normal rate and regular rhythm.  ?   Pulses: Normal pulses.  ?Pulmonary:  ?   Effort: Pulmonary effort is normal. No respiratory distress.  ?   Breath sounds: Normal breath sounds. No wheezing.  ?Skin: ?   General: Skin is warm and dry.  ?Neurological:  ?   General: No focal deficit present.  ?   Mental Status: She is alert and oriented to person, place, and time.   ?Psychiatric:     ?   Mood and Affect: Mood normal.     ?   Behavior: Behavior normal.  ? ? ? ? ? ?   ?Assessment & Plan:  ? ?Otitis externa- new.  Pt's R EAC w/ crusted debris and is erythematous.  No TTP or pain w/ manipulation of pinna.  TM WNL.  Start Ciprodex drops and encouraged her to keep ear clean and dry- particularly in the setting of hearing aids.  Pt expressed understanding and is in agreement w/ plan.  ?

## 2021-06-26 NOTE — Patient Instructions (Signed)
Follow up as scheduled ?START the Ciprodex ear drops as directed for 7 days ?I will send the Phentermine later b/c my prescribing system for controlled substances is not working (I can do it from home) ?Try and keep ear clean and dry ?Continue to work on low carb diet and regular physical activity- you can do it! ?Call with any questions or concerns ?Hang in there!!! ?

## 2021-06-26 NOTE — Assessment & Plan Note (Signed)
Deteriorated. Pt has gained 6 lbs since last in-office visit.  She is frustrated with her weight gain but admits she is not doing what she needs to be doing in regards to diet and exercise.  She is asking to restart Phentermine to boost her metabolism and assist her weight loss efforts.  Prescription provided. ?

## 2021-06-28 ENCOUNTER — Telehealth: Payer: Self-pay

## 2021-06-28 NOTE — Telephone Encounter (Signed)
Pt requesting alternative as this is not covered  ?

## 2021-06-28 NOTE — Telephone Encounter (Signed)
Caller name:Sabrina Mauceri  ? ?On DPR? :Yes ? ?Call back [phone] ? ?Provider they see: Birdie Riddle  ? ?Reason for call:Pt is calling the ear drops ciprofloxacin-dexamethasone (CIPRODEX) OTIC suspension  the insurance will not cover is there anything else that she can take or you can send for her in place of this ?  ? ?

## 2021-06-30 MED ORDER — OFLOXACIN 0.3 % OT SOLN: 10.0000 [drp] | Freq: Every day | OTIC | 0 refills | Status: DC

## 2021-06-30 NOTE — Telephone Encounter (Signed)
New prescription sent to pharmacy 

## 2021-07-01 NOTE — Telephone Encounter (Signed)
Patient aware.

## 2021-07-10 ENCOUNTER — Ambulatory Visit (INDEPENDENT_AMBULATORY_CARE_PROVIDER_SITE_OTHER): Payer: Medicare Other | Admitting: Family Medicine

## 2021-07-10 ENCOUNTER — Encounter: Payer: Self-pay | Admitting: Family Medicine

## 2021-07-10 VITALS — BP 118/78 | HR 79 | Temp 97.5°F | Resp 16 | Ht 68.0 in | Wt 278.6 lb

## 2021-07-10 DIAGNOSIS — Z0000 Encounter for general adult medical examination without abnormal findings: Secondary | ICD-10-CM

## 2021-07-10 DIAGNOSIS — H6691 Otitis media, unspecified, right ear: Secondary | ICD-10-CM | POA: Diagnosis not present

## 2021-07-10 LAB — VITAMIN D 25 HYDROXY (VIT D DEFICIENCY, FRACTURES): VITD: 28.36 ng/mL — ABNORMAL LOW (ref 30.00–100.00)

## 2021-07-10 LAB — CBC WITH DIFFERENTIAL/PLATELET
Basophils Absolute: 0 10*3/uL (ref 0.0–0.1)
Basophils Relative: 0.8 % (ref 0.0–3.0)
Eosinophils Absolute: 0.1 10*3/uL (ref 0.0–0.7)
Eosinophils Relative: 1.7 % (ref 0.0–5.0)
HCT: 43 % (ref 36.0–46.0)
Hemoglobin: 14.6 g/dL (ref 12.0–15.0)
Lymphocytes Relative: 25.8 % (ref 12.0–46.0)
Lymphs Abs: 0.9 10*3/uL (ref 0.7–4.0)
MCHC: 34 g/dL (ref 30.0–36.0)
MCV: 98.1 fl (ref 78.0–100.0)
Monocytes Absolute: 0.3 10*3/uL (ref 0.1–1.0)
Monocytes Relative: 9.7 % (ref 3.0–12.0)
Neutro Abs: 2.2 10*3/uL (ref 1.4–7.7)
Neutrophils Relative %: 62 % (ref 43.0–77.0)
Platelets: 234 10*3/uL (ref 150.0–400.0)
RBC: 4.38 Mil/uL (ref 3.87–5.11)
RDW: 14.6 % (ref 11.5–15.5)
WBC: 3.6 10*3/uL — ABNORMAL LOW (ref 4.0–10.5)

## 2021-07-10 LAB — BASIC METABOLIC PANEL
BUN: 19 mg/dL (ref 6–23)
CO2: 30 mEq/L (ref 19–32)
Calcium: 10.7 mg/dL — ABNORMAL HIGH (ref 8.4–10.5)
Chloride: 102 mEq/L (ref 96–112)
Creatinine, Ser: 0.9 mg/dL (ref 0.40–1.20)
GFR: 61.9 mL/min (ref >60.00)
Glucose, Bld: 128 mg/dL — ABNORMAL HIGH (ref 70–99)
Potassium: 3.9 mEq/L (ref 3.5–5.1)
Sodium: 137 mEq/L (ref 135–145)

## 2021-07-10 LAB — LIPID PANEL
Cholesterol: 116 mg/dL (ref 0–200)
HDL: 50.6 mg/dL (ref >39.00)
LDL Cholesterol: 52 mg/dL (ref 0–99)
NonHDL: 65.86
Total CHOL/HDL Ratio: 2
Triglycerides: 71 mg/dL (ref 0.0–149.0)
VLDL: 14.2 mg/dL (ref 0.0–40.0)

## 2021-07-10 LAB — HEPATIC FUNCTION PANEL
ALT: 23 U/L (ref 0–35)
AST: 22 U/L (ref 0–37)
Albumin: 4.5 g/dL (ref 3.5–5.2)
Alkaline Phosphatase: 68 U/L (ref 39–117)
Bilirubin, Direct: 0.3 mg/dL (ref 0.0–0.3)
Total Bilirubin: 1.3 mg/dL — ABNORMAL HIGH (ref 0.2–1.2)
Total Protein: 6.8 g/dL (ref 6.0–8.3)

## 2021-07-10 LAB — TSH: TSH: 2.29 u[IU]/mL (ref 0.35–5.50)

## 2021-07-10 MED ORDER — AMOXICILLIN 875 MG PO TABS: 875.0000 mg | ORAL_TABLET | Freq: Two times a day (BID) | ORAL | 0 refills | Status: AC

## 2021-07-10 MED ORDER — PANTOPRAZOLE SODIUM 40 MG PO TBEC: 40.0000 mg | DELAYED_RELEASE_TABLET | Freq: Every day | ORAL | 1 refills | Status: DC

## 2021-07-10 NOTE — Progress Notes (Signed)
? ?Subjective:  ? ? Patient ID: Shelby Reeves, female    DOB: [birth year], 76 y.o.   MRN: [id number] ? ?HPI ?CPE- UTD on mammo, colonoscopy, Tdap, PNA vaccines, flu.  'i feel fine'. ? ?Patient Care Team  ?  Relationship Specialty Notifications Start End  ?[name], MD PCP - General   05/15/10   ?Ladene Artist, MD Consulting Physician Gastroenterology  02/09/15   ?Jerrell Belfast, MD Consulting Physician Otolaryngology  02/18/17   ?Konrad Felix, MD Referring Physician Ophthalmology  10/06/18   ?Madelin Rear, [facility] Pharmacist Pharmacist  07/18/19   ? Comment: Phone:  [phone])  ?  ?Health Maintenance  ?Topic Date Due  ? COVID-19 Vaccine (3 - Pfizer risk series) 07/06/2019  ? DEXA SCAN  09/05/2020  ? MAMMOGRAM  11/06/2021  ? COLONOSCOPY (Pts 45-33yr Insurance coverage will need to be confirmed)  04/09/2023  ? TETANUS/TDAP  12/30/2028  ? Pneumonia Vaccine 77 Years old  Completed  ? INFLUENZA VACCINE  Completed  ? Hepatitis C Screening  Completed  ? Zoster Vaccines- Shingrix  Completed  ? HPV VACCINES  Aged Out  ?  ? ? ?Review of Systems ?Patient reports no vision/ hearing changes, adenopathy, fever, weight change,  persistant/recurrent hoarseness, swallowing issues, chest pain, palpitations, edema, persistant/recurrent cough, hemoptysis, dyspnea (rest/exertional/paroxysmal nocturnal), gastrointestinal bleeding (melena, rectal bleeding), abdominal pain, significant heartburn, bowel changes, GU symptoms (dysuria, hematuria, incontinence), Gyn symptoms (abnormal  bleeding, pain),  syncope, focal weakness, memory loss, numbness & tingling, skin/hair/nail changes, abnormal bruising or bleeding, anxiety, or depression. ? ?R ear pain- pt has been using drops for otitis externa but reports that last night ear started throbbing.  Feels that it is infected again. ? ?This visit occurred during the SARS-CoV-2 public health emergency.  Safety protocols were in place, including screening questions prior to the  visit, additional usage of staff PPE, and extensive cleaning of exam room while observing appropriate contact time as indicated for disinfecting solutions.   ?   ?Objective:  ? Physical Exam ?General Appearance:    Alert, cooperative, no distress, appears stated age  ?Head:    Normocephalic, without obvious abnormality, atraumatic  ?Eyes:    PERRL, conjunctiva/corneas clear, EOM's intact, fundi  ?  benign, both eyes  ?Ears:    R TM red, bulging w/ purulent fluid visible, L TM WNL  ?Nose:   Deferred due to COVID  ?Throat:   ?Neck:   Supple, symmetrical, trachea midline, no adenopathy;  ?  Thyroid: no enlargement/tenderness/nodules  ?Back:     Symmetric, no curvature, ROM normal, no CVA tenderness  ?Lungs:     Clear to auscultation bilaterally, respirations unlabored  ?Chest Wall:    No tenderness or deformity  ? Heart:    Regular rate and rhythm, S1 and S2 normal, no murmur, rub ?  or gallop  ?Breast Exam:    Deferred to mammo  ?Abdomen:     Soft, non-tender, bowel sounds active all four quadrants,  ?  no masses, no organomegaly  ?Genitalia:    Deferred  ?Rectal:    ?Extremities:   Extremities normal, atraumatic, no cyanosis or edema  ?Pulses:   2+ and symmetric all extremities  ?Skin:   Skin color, texture, turgor normal, no rashes or lesions  ?Lymph nodes:   Cervical, supraclavicular, and axillary nodes normal  ?Neurologic:   CNII-XII intact, normal strength, sensation and reflexes  ?  throughout  ?  ? ? ? ?   ?Assessment & Plan:  ?R  OM- recurrent problem for pt.  She can now recognize the early symptoms.  Ear is red, TM is bulging, and purulent fluid visible.  Will start Amoxicillin.  If no improvement will need to go to ENT.  Pt expressed understanding and is in agreement w/ plan.  ? ?

## 2021-07-10 NOTE — Patient Instructions (Addendum)
Follow up in 6 months to recheck BP and cholesterol ?We'll notify you of your lab results and make any changes if needed ?START the Amoxicillin twice daily- take w/ food ?Continue to work on healthy diet and regular exercise ?Call with any questions or concerns ?Stay Safe!  Stay Healthy! ?Happy Early Birthday!! ?

## 2021-07-10 NOTE — Assessment & Plan Note (Signed)
Pt's PE WNL w/ exception of R OM and obesity.  UTD on mammo, colonoscopy, immunizations.  Check labs.  Anticipatory guidance provided.  ?

## 2021-07-10 NOTE — Assessment & Plan Note (Signed)
Pt is down 6 lbs since starting the Phentermine at her last visit.  Applauded her efforts and encouraged her to continue to work on healthy diet and regular exercise. ?

## 2021-07-12 ENCOUNTER — Other Ambulatory Visit: Payer: Self-pay

## 2021-07-12 ENCOUNTER — Other Ambulatory Visit (INDEPENDENT_AMBULATORY_CARE_PROVIDER_SITE_OTHER): Payer: Medicare Other

## 2021-07-12 DIAGNOSIS — R7309 Other abnormal glucose: Secondary | ICD-10-CM

## 2021-07-15 LAB — HEMOGLOBIN A1C: Hgb A1c MFr Bld: 6.1 % (ref 4.6–6.5)

## 2021-07-16 ENCOUNTER — Telehealth: Payer: Self-pay

## 2021-07-16 NOTE — Telephone Encounter (Signed)
Patient is aware of all labs, no questions at this time. Advised to get vitamin d  ?

## 2021-07-16 NOTE — Telephone Encounter (Signed)
-----   Message from [name], MD sent at 07/15/2021  9:06 AM EDT ----- ?Thankfully no diabetes!  Continue to work on low carb diet and regular exercise ?

## 2021-07-29 ENCOUNTER — Telehealth: Payer: Self-pay | Admitting: Family Medicine

## 2021-07-29 ENCOUNTER — Encounter (HOSPITAL_COMMUNITY): Payer: Self-pay | Admitting: *Deleted

## 2021-07-29 ENCOUNTER — Other Ambulatory Visit: Payer: Self-pay

## 2021-07-29 ENCOUNTER — Emergency Department (HOSPITAL_COMMUNITY)
Admission: EM | Admit: 2021-07-29 | Discharge: 2021-07-29 | Disposition: A | Discharge status: Home / Self Care | Payer: Medicare Other | Attending: Emergency Medicine | Admitting: Emergency Medicine

## 2021-07-29 DIAGNOSIS — Z79899 Other long term (current) drug therapy: Secondary | ICD-10-CM | POA: Insufficient documentation

## 2021-07-29 DIAGNOSIS — I10 Essential (primary) hypertension: Secondary | ICD-10-CM | POA: Insufficient documentation

## 2021-07-29 DIAGNOSIS — E876 Hypokalemia: Secondary | ICD-10-CM | POA: Insufficient documentation

## 2021-07-29 DIAGNOSIS — E1165 Type 2 diabetes mellitus with hyperglycemia: Secondary | ICD-10-CM | POA: Diagnosis not present

## 2021-07-29 DIAGNOSIS — R739 Hyperglycemia, unspecified: Secondary | ICD-10-CM | POA: Diagnosis not present

## 2021-07-29 DIAGNOSIS — Z7982 Long term (current) use of aspirin: Secondary | ICD-10-CM | POA: Diagnosis not present

## 2021-07-29 DIAGNOSIS — D72819 Decreased white blood cell count, unspecified: Secondary | ICD-10-CM | POA: Diagnosis not present

## 2021-07-29 LAB — CBC WITH DIFFERENTIAL/PLATELET
Abs Immature Granulocytes: 0.02 10*3/uL (ref 0.00–0.07)
Basophils Absolute: 0 10*3/uL (ref 0.0–0.1)
Basophils Relative: 1 %
Eosinophils Absolute: 0 10*3/uL (ref 0.0–0.5)
Eosinophils Relative: 1 %
HCT: 42.1 % (ref 36.0–46.0)
Hemoglobin: 14.7 g/dL (ref 12.0–15.0)
Immature Granulocytes: 1 %
Lymphocytes Relative: 28 %
Lymphs Abs: 0.8 10*3/uL (ref 0.7–4.0)
MCH: 33.4 pg (ref 26.0–34.0)
MCHC: 34.9 g/dL (ref 30.0–36.0)
MCV: 95.7 fL (ref 80.0–100.0)
Monocytes Absolute: 0.2 10*3/uL (ref 0.1–1.0)
Monocytes Relative: 7 %
Neutro Abs: 1.8 10*3/uL (ref 1.7–7.7)
Neutrophils Relative %: 62 %
Platelets: 244 10*3/uL (ref 150–400)
RBC: 4.4 MIL/uL (ref 3.87–5.11)
RDW: 12.5 % (ref 11.5–15.5)
WBC: 2.9 10*3/uL — ABNORMAL LOW (ref 4.0–10.5)
nRBC: 0 % (ref 0.0–0.2)

## 2021-07-29 LAB — TROPONIN I (HIGH SENSITIVITY): Troponin I (High Sensitivity): 5 ng/L (ref <18)

## 2021-07-29 LAB — BASIC METABOLIC PANEL
Anion gap: 5 (ref 5–15)
BUN: 12 mg/dL (ref 8–23)
CO2: 27 mmol/L (ref 22–32)
Calcium: 10.1 mg/dL (ref 8.9–10.3)
Chloride: 103 mmol/L (ref 98–111)
Creatinine, Ser: 0.74 mg/dL (ref 0.44–1.00)
GFR, Estimated: >60 mL/min (ref >60)
Glucose, Bld: 144 mg/dL — ABNORMAL HIGH (ref 70–99)
Potassium: 3.4 mmol/L — ABNORMAL LOW (ref 3.5–5.1)
Sodium: 135 mmol/L (ref 135–145)

## 2021-07-29 LAB — URINALYSIS, ROUTINE W REFLEX MICROSCOPIC
Bilirubin Urine: NEGATIVE
Glucose, UA: NEGATIVE mg/dL
Hgb urine dipstick: NEGATIVE
Ketones, ur: NEGATIVE mg/dL
Leukocytes,Ua: NEGATIVE
Nitrite: NEGATIVE
Protein, ur: NEGATIVE mg/dL
Specific Gravity, Urine: 1.01 (ref 1.005–1.030)
pH: 7 (ref 5.0–8.0)

## 2021-07-29 NOTE — ED Triage Notes (Signed)
Pt reports having bp checked at home and it was elevated, SBP >200. Has hx of htn, has been taking meds as prescribed. No distress noted. Recently started taking phentermine for weight loss.  ?

## 2021-07-29 NOTE — Discharge Instructions (Signed)
Blood pressure has decreased here to 148/94.  As discussed, stop the phentermine and recheck with your primary care doctor. ?You have mild hypokalemia.  Please eat potassium rich foods and have this rechecked ?Your blood sugar was elevated today at 144 and will need to be rechecked in your primary care office ?Return to the emergency department if you are having any worsening symptoms especially chest pain, shortness of breath, fever or chills. ?Please call your primary care doctor today for follow-up. ?

## 2021-07-29 NOTE — Telephone Encounter (Signed)
Chief Complaint BLOOD PRESSURE HIGH - Systolic (top ?number) 160 or greater ?Reason for Call Symptomatic / Request for Health Information ?Initial Comment Caller states calling from office, Pt has a bp of ?207/101 having headaches. ?Translation No ?Nurse Assessment ?Nurse: Jearld Pies, RN, Lovena Le Date/Time Eilene Ghazi Time): 07/29/2021 8:48:23 AM ?Confirm and document reason for call. If ?symptomatic, describe symptoms. ?---Pt has a bp of 207/101 having headaches. Current ?pain level 1/10. No otc medication. BP medication ?taken at 7am with no relief. Denies SOB, chest pain, or ?any other symptoms at this time. ?Does the patient have any new or worsening ?symptoms? ---Yes ?Will a triage be completed? ---Yes ?Related visit to physician within the last 2 weeks? ---No ?Does the PT have any chronic conditions? (i.e. ?diabetes, asthma, this includes High risk factors for ?pregnancy, etc.) ?---Yes ?List chronic conditions. ---HTN ?Is this a behavioral health or substance abuse call? ---No ?Guidelines ?Guideline Title Affirmed Question Affirmed Notes Nurse Date/Time (Eastern ?Time) ?Blood Pressure - ?High ?[1] Systolic BP >= ?093 OR Diastolic ?>= 235 AND [5] ?cardiac or neurologic ?symptoms (e.g., ?chest pain, difficulty ?Jearld Pies, RN, Lovena Le 07/29/2021 8:50:09 AM ?PLEASE NOTE: All timestamps contained within this report are represented as [place] Standard Time. ?CONFIDENTIALTY NOTICE: This fax transmission is intended only for the addressee. It contains information that is legally privileged, confidential or ?otherwise protected from use or disclosure. If you are not the intended recipient, you are strictly prohibited from reviewing, disclosing, copying using ?or disseminating any of this information or taking any action in reliance on or regarding this information. If you have received this fax in error, please ?notify us immediately by telephone so that we can arrange for its return to [place]. Phone: [phone], Toll-Free:  [phone], Fax: [phone] ?Page: 2 of 2 ?Call Id: [id number] ?Guidelines ?Guideline Title Affirmed Question Affirmed Notes Nurse Date/Time (Eastern ?Time) ?breathing, unsteady ?gait, blurred vision) ?Disp. Time (Eastern ?Time) Disposition Final User ?07/29/2021 8:45:29 AM Send to Urgent Loetta Rough ?07/29/2021 8:52:08 AM Go to ED Now Yes Jearld Pies, RN, Lovena Le ?Caller Disagree/Comply Comply ?Caller Understands Yes ?PreDisposition InappropriateToAsk ?Care Advice Given Per Guideline ?* Leave now. Drive carefully. * Go to the ED at _________[facility] Hospital. * You need to be seen in the Emergency Department. GO ?TO ED NOW: CALL EMS 911 IF: * Another adult should drive. NOTE TO TRIAGER - DRIVING: ?Referrals ?[facility] - ED ?

## 2021-07-29 NOTE — ED Provider Triage Note (Signed)
Emergency Medicine Provider Triage Evaluation Note ? ?Shelby Reeves , a 76 y.o. female  was evaluated in triage.  Pt complains of elevated blood pressure.  Patient has a history of hypertension and takes losartan and HCTZ daily and reports compliance with her medications.  She was at a family birthday party yesterday where someone was playing around with a blood pressure cuff and took her blood pressure.  Noted to be about 924/268 systolic.  They took this reading several times.  Patient contacted her PCP this morning however they were unable to get her in and referred her to the emergency department.  Patient also notes that she started taking phentermine for weight loss last month.  ? ?Review of Systems  ?Positive: Elevated blood pressure, mild headaches ?Negative: Chest pain, vision changes, abdominal pain, N/V/D ? ?Physical Exam  ?BP (!) 166/103 (BP Location: Right Arm)   Pulse 98   Temp 98.3 ?F (36.8 ?C) (Oral)   Resp 17   SpO2 96%  ?Gen:   Awake, no distress   ?Resp:  Normal effort  ?MSK:   Moves extremities without difficulty  ?Other:   ? ?Medical Decision Making  ?Medically screening exam initiated at 10:08 AM.  Appropriate orders placed.  Shelby Reeves was informed that the remainder of the evaluation will be completed by another provider, this initial triage assessment does not replace that evaluation, and the importance of remaining in the ED until their evaluation is complete. ? ? ?  ?Tonye Pearson, [place] ?07/29/21 1010 ? ?

## 2021-07-29 NOTE — ED Provider Notes (Signed)
?[facility] ?Provider Note ? ? ?CSN: [id number] ?Arrival date & time: 07/29/21  6503 ? ?  ? ?History ? ?Chief Complaint  ?Patient presents with  ? Hypertension  ? ? ?CORIANNA Reeves is a 76 y.o. female. ? ?HPI ?21 24-year-old female history of hypertension presents today complaining of hypertension.  She states that she took her blood pressure randomly last night.  She took it only because other family members were taking the blood pressure.  At that time it was noted to be elevated.  She rechecked it again this morning and it is systolically 546.  She is not having any symptoms, specifically no headache, lightheadedness, lateralized weakness, vision changes, chest pain, shortness of breath.  She states that she has been taking her same blood pressure medicines for period of time and has not had any recent changes.  She is on hydrochlorothiazide, Cozaar and pravastatin.  She has recently started phentermine in the past month.  However during that time, she did have a normal blood pressure per her report at her primary care office. ? ?  ? ?Home Medications ?Prior to Admission medications   ?Medication Sig Start Date End Date Taking? Authorizing Provider  ?aspirin 81 MG tablet Take 81 mg by mouth daily.    [provider]  ?hydrochlorothiazide (HYDRODIURIL) 12.5 MG tablet TAKE 1 TO 2 TABLETS BY MOUTH EVERY DAY DEPENDING ON SWELLING 05/20/21   [name], MD  ?losartan (COZAAR) 50 MG tablet TAKE 1 TABLET(50 MG) BY MOUTH DAILY 04/18/21   [name], MD  ?Melatonin 5 MG CAPS Take by mouth.    [provider]  ?Multiple Vitamin (MULTIVITAMIN) tablet Take 1 tablet by mouth daily.    [provider]  ?Multiple Vitamins-Minerals (HAIR SKIN AND NAILS FORMULA PO) Take by mouth.    [provider]  ?pantoprazole (PROTONIX) 40 MG tablet Take 1 tablet (40 mg total) by mouth daily. 07/10/21   [name], MD  ?phentermine 37.5 MG capsule  Take 1 capsule (37.5 mg total) by mouth every morning. 06/26/21   [name], MD  ?pravastatin (PRAVACHOL) 40 MG tablet TAKE 1 TABLET(40 MG) BY MOUTH DAILY 05/20/21   [name], MD  ?lisinopril-hydrochlorothiazide (PRINZIDE,ZESTORETIC) 10-12.5 MG per tablet Take 1 tablet by mouth daily. 08/05/10 07/21/11  [name], MD  ?   ? ?Allergies    ?Iodine and Shellfish allergy   ? ?Review of Systems   ?Review of Systems  ?All other systems reviewed and are negative. ? ?Physical Exam ?Updated Vital Signs ?BP 125/76   Pulse 78   Temp 98.3 ?F (36.8 ?C) (Oral)   Resp 13   SpO2 94%  ?Physical Exam ?Vitals and nursing note reviewed.  ?Constitutional:   ?   Appearance: Normal appearance.  ?HENT:  ?   Head: Normocephalic.  ?   Right Ear: Tympanic membrane normal.  ?   Ears:  ?   Comments: Small excoriated area right external ear ?   Nose: Nose normal.  ?   Mouth/Throat:  ?   Mouth: Mucous membranes are moist.  ?   Pharynx: Oropharynx is clear.  ?Eyes:  ?   Extraocular Movements: Extraocular movements intact.  ?   Pupils: Pupils are equal, round, and reactive to light.  ?Cardiovascular:  ?   Rate and Rhythm: Normal rate and regular rhythm.  ?   Pulses: Normal pulses.  ?Pulmonary:  ?   Effort: Pulmonary effort is normal.  ?  Abdominal:  ?   General: Bowel sounds are normal.  ?   Palpations: Abdomen is soft.  ?Musculoskeletal:     ?   General: No swelling or tenderness. Normal range of motion.  ?   Cervical back: Normal range of motion.  ?   Right lower leg: No edema.  ?   Left lower leg: No edema.  ?Skin: ?   General: Skin is warm.  ?   Capillary Refill: Capillary refill takes less than 2 seconds.  ?Neurological:  ?   General: No focal deficit present.  ?   Mental Status: She is alert.  ?   Cranial Nerves: No cranial nerve deficit.  ?   Sensory: No sensory deficit.  ?   Motor: No weakness.  ?   Coordination: Coordination normal.  ?Psychiatric:     ?   Mood and Affect: Mood normal.  ? ? ?ED Results /  Procedures / Treatments   ?Labs ?(all labs ordered are listed, but only abnormal results are displayed) ?Labs Reviewed  ?BASIC METABOLIC PANEL - Abnormal; Notable for the following components:  ?    Result Value  ? Potassium 3.4 (*)   ? Glucose, Bld 144 (*)   ? All other components within normal limits  ?CBC WITH DIFFERENTIAL/PLATELET - Abnormal; Notable for the following components:  ? WBC 2.9 (*)   ? All other components within normal limits  ?URINALYSIS, ROUTINE W REFLEX MICROSCOPIC  ?TROPONIN I (HIGH SENSITIVITY)  ? ? ?EKG ?EKG Interpretation ? ?Date/Time:  Monday July 29 2021 10:40:47 EDT ?Ventricular Rate:  76 ?PR Interval:  294 ?QRS Duration: 103 ?QT Interval:  379 ?QTC Calculation: 427 ?R Axis:   -48 ?Text Interpretation: Sinus rhythm Multiple ventricular premature complexes Prolonged PR interval Left anterior fascicular block Low voltage, precordial leads Consider anterior infarct Confirmed by Pattricia Reeves [phone]) on 07/29/2021 11:50:52 AM ? ?Radiology ?No results found. ? ?Procedures ?Procedures  ? ? ?Medications Ordered in ED ?Medications - No data to display ? ?ED Course/ Medical Decision Making/ A&P ?Clinical Course as of 07/29/21 1154  ?Mon Jul 29, 2021  ?1149 CBC with Differential(!) ?Reviewed interpreted with mild leukopenia otherwise within normal limits [DR]  ?0388 Basic metabolic panel(!) ?Reviewed and interpreted with mild hypokalemia at 3.4 and hyperglycemia 144 otherwise within normal limits [DR]  ?1149 Troponin I (High Sensitivity) ?Troponin within normal limits [DR]  ?  ?Clinical Course User Index ?[DR] Pattricia Boss, MD  ? ?                        ?Medical Decision Making ?75 year old female history of hypertension, recently started on mien who presents today with high asymptomatic hypertension.  Patient's blood pressure was incidentally noted to be significantly elevated at 200 last night.  Here today it is currently 148/94.  We have discussed possible multiple etiologies.  She has not had  any recent changes in her blood pressure medications.  She has been on these medications for some significant amount of time.  However, she has been started on phentermine.  I think this is the most likely culprit.  Plan to discontinue phentermine and she will follow-up with her primary care for blood pressure recheck and further discussion regarding phentermine use and possible other weight loss therapy.  Labs reviewed and she has some mild leukopenia and hypokalemia at 3.4.  She is advised regarding having her potassium rechecked and eating a potassium rich diet.  Additionally, she will need to  have her blood sugar rechecked in her primary care office. ?She appears stable for discharge.  Discussed return precautions and need for follow-up and she voiced understanding. ? ?Amount and/or Complexity of Data Reviewed ?External Data Reviewed: notes. ?Labs: ordered. Decision-making details documented in ED Course. ?Radiology: ordered and independent interpretation performed. Decision-making details documented in ED Course. ?ECG/medicine tests: ordered and independent interpretation performed. Decision-making details documented in ED Course. ?   Details: Patient on monitor remains in normal sinus rhythm hemodynamically stable ? ? ? ? ? ? ? ? ? ? ?Final Clinical Impression(s) / ED Diagnoses ?Final diagnoses:  ?Hypertension, unspecified type  ?Hypokalemia  ?Hyperglycemia  ? ? ?Rx / DC Orders ?ED Discharge Orders   ? ? None  ? ?  ? ? ?  ?Pattricia Boss, MD ?07/29/21 1154 ? ?

## 2021-07-30 ENCOUNTER — Encounter: Payer: Self-pay | Admitting: Family Medicine

## 2021-07-31 ENCOUNTER — Other Ambulatory Visit: Payer: Self-pay

## 2021-07-31 ENCOUNTER — Encounter: Payer: Self-pay | Admitting: Family Medicine

## 2021-07-31 ENCOUNTER — Ambulatory Visit: Payer: Medicare Other | Admitting: Family Medicine

## 2021-07-31 VITALS — BP 148/84 | HR 80 | Temp 98.0°F | Resp 18 | Ht 68.0 in | Wt 271.0 lb

## 2021-07-31 DIAGNOSIS — E876 Hypokalemia: Secondary | ICD-10-CM

## 2021-07-31 DIAGNOSIS — I10 Essential (primary) hypertension: Secondary | ICD-10-CM

## 2021-07-31 DIAGNOSIS — H6641 Suppurative otitis media, unspecified, right ear: Secondary | ICD-10-CM

## 2021-07-31 DIAGNOSIS — H60391 Other infective otitis externa, right ear: Secondary | ICD-10-CM

## 2021-07-31 MED ORDER — OFLOXACIN 0.3 % OT SOLN: 10.0000 [drp] | Freq: Every day | OTIC | 0 refills | Status: DC

## 2021-07-31 MED ORDER — AMOXICILLIN-POT CLAVULANATE 875-125 MG PO TABS: 1.0000 | ORAL_TABLET | Freq: Two times a day (BID) | ORAL | 0 refills | Status: DC

## 2021-07-31 NOTE — Patient Instructions (Addendum)
Try salt substitute, minimize salt in diet. See handout.  ?Nurse visit next week with your home blood pressure machine.  ?If blood pressure remains elevated in next 2 days, then increase to 2 of the losartan for now (total '100mg'$ ).  ?Recheck blood pressure with Dr. Birdie Riddle or myself in next 2 weeks.  ? ?I will refer you to ENT for your left ear. Restart antibiotic - new pills and ear drops.  ? ? ? ? ?If you have lab work done today you will be contacted with your lab results within the next 2 weeks.  If you have not heard from [place] then please contact us. The fastest way to get your results is to register for My Chart. ? ? ?IF you received an x-ray today, you will receive an invoice from [facility]. Please contact [facility] at [phone] with questions or concerns regarding your invoice.  ? ?IF you received labwork today, you will receive an invoice from [facility]. Please contact [facility] at [phone] with questions or concerns regarding your invoice.  ? ?Our billing staff will not be able to assist you with questions regarding bills from these companies. ? ?You will be contacted with the lab results as soon as they are available. The fastest way to get your results is to activate your My Chart account. Instructions are located on the last page of this paperwork. If you have not heard from [place] regarding the results in 2 weeks, please contact this office. ?  ? ? ?

## 2021-07-31 NOTE — Progress Notes (Signed)
? ?Subjective:  ?Patient ID: Shelby Reeves, female    DOB: Nov 25, 1945  Age: 76 y.o. MRN: [id number] ? ?CC:  ?Chief Complaint  ?Patient presents with  ? Hospitalization Follow-up  ?  Patient states she is here for ER follow up with a elevated BP. Patient states her blood pressure over 200.  ? ? ?HPI ?Shelby Reeves presents for  ? ?Hypertension ?Last visit with PCP, Dr. Birdie Riddle on March 15th blood pressure controlled at 118/78 at that time. ? ?ER visit April 3.  She randomly checked blood pressure night prior. 210/140.  Noted to be elevated, then again elevated on repeat testing morning of April 3 with systolic 768.  Asymptomatic.  Denied missed doses of HCTZ, losartan or pravastatin.  She had recently started phentermine in the prior month.  BP 148/94 in the ER.  PHentermine was discontinued.  no other options for weight loss with PCP.  Also noted to have mild leukopenia and hypokalemia at 3.4.  Potassium rich diet discussed with repeat testing.  Hyperglycemia also noted, recommended in office repeat.  Hx of prediabetes, with recent A1c.  ? ?Last dose phentermine 3 days ago.  ?No missed doses hctz, losartan.  ?Home BP 155/120 this morning (after taking meds) 149/97 last night. Does not have meter. Feels well, no new symptoms.occasional ibuprofen for HA - less than once per week.  ?Adds salt to food some time.  ?Claritin few days per week. No decongestants or cold meds.  ? ?Lab Results  ?Component Value Date  ? HGBA1C 6.1 07/12/2021  ? ?Lab Results  ?Component Value Date  ? K 3.4 (L) 07/29/2021  ? ?Lab Results  ?Component Value Date  ? WBC 2.9 (L) 07/29/2021  ? HGB 14.7 07/29/2021  ? HCT 42.1 07/29/2021  ? MCV 95.7 07/29/2021  ? PLT 244 07/29/2021  ? ? ?BP Readings from Last 3 Encounters:  ?07/31/21 (!) 148/84  ?07/29/21 125/76  ?07/10/21 118/78  ? ?Lab Results  ?Component Value Date  ? CREATININE 0.74 07/29/2021  ? ?Treated for otitis externa 3/1 with ciprodex, amoxicillin on 3/15 visit for R otitis media. Plan for  ENT eval if not improving. Pain resolved, but still some congestion. Unable to hear out of R ear.  ? ?History ?Patient Active Problem List  ? Diagnosis Date Noted  ? Cyst of finger 10/03/2015  ? Severe obesity (BMI >= 40) ([place]) 12/07/2013  ? Ventral hernia 02/25/2013  ? Paresthesia of left arm 09/23/2012  ? Generalized anxiety disorder 09/23/2012  ? General medical examination 02/12/2011  ? Back pain 08/27/2010  ? ORTHOSTATIC HYPOTENSION 06/28/2010  ? VERTIGO 06/28/2010  ? Hyperlipidemia 05/06/2010  ? Essential hypertension 05/06/2010  ? RHINITIS 05/06/2010  ? ?Past Medical History:  ?Diagnosis Date  ? Allergy   ? Carpal tunnel syndrome   ? Fibroid   ? GERD (gastroesophageal reflux disease)   ? History of colon polyps 2002  ? Hyperlipidemia   ? Hypertension   ? Vertigo   ? ?Past Surgical History:  ?Procedure Laterality Date  ? BIOPSY THYROID    ? 2018  ? CARPAL TUNNEL RELEASE    ? bilateral  ? CATARACT EXTRACTION W/ INTRAOCULAR LENS  IMPLANT, BILATERAL Bilateral 2018  ? July and August  ? COLONOSCOPY    ? hx tubular adenomas 2003  ? ENDOMETRIAL BIOPSY  09-12-09  ? --benign/Dr. Romine  ? HYSTEROSCOPY  10-22-09  ? resection of polyps and D & C-Dr. Joan Flores  ? HYSTEROSCOPY  06-17-91  ? and D &  C  ? KNEE ARTHROSCOPY  2010  ? right  ? POLYPECTOMY    ? UTERINE FIBROID SURGERY    ? ?Allergies  ?Allergen Reactions  ? Iodine Hives  ? Shellfish Allergy Hives  ? ?Prior to Admission medications   ?Medication Sig Start Date End Date Taking? Authorizing Provider  ?aspirin 81 MG tablet Take 81 mg by mouth daily.   Yes [provider]  ?hydrochlorothiazide (HYDRODIURIL) 12.5 MG tablet TAKE 1 TO 2 TABLETS BY MOUTH EVERY DAY DEPENDING ON SWELLING 05/20/21  Yes [name], MD  ?losartan (COZAAR) 50 MG tablet TAKE 1 TABLET(50 MG) BY MOUTH DAILY 04/18/21  Yes [name], MD  ?Melatonin 5 MG CAPS Take by mouth.   Yes [provider]  ?Multiple Vitamin (MULTIVITAMIN) tablet Take 1 tablet by mouth daily.   Yes  [provider]  ?Multiple Vitamins-Minerals (HAIR SKIN AND NAILS FORMULA PO) Take by mouth.   Yes [provider]  ?pantoprazole (PROTONIX) 40 MG tablet Take 1 tablet (40 mg total) by mouth daily. 07/10/21  Yes [name], MD  ?pravastatin (PRAVACHOL) 40 MG tablet TAKE 1 TABLET(40 MG) BY MOUTH DAILY 05/20/21  Yes [name], MD  ?phentermine 37.5 MG capsule Take 1 capsule (37.5 mg total) by mouth every morning. ?Patient not taking: Reported on 07/31/2021 06/26/21   [name], MD  ?lisinopril-hydrochlorothiazide (PRINZIDE,ZESTORETIC) 10-12.5 MG per tablet Take 1 tablet by mouth daily. 08/05/10 07/21/11  [name], MD  ? ?Social History  ? ?Socioeconomic History  ? Marital status: Widowed  ?  Spouse name: Not on file  ? Number of children: Not on file  ? Years of education: Not on file  ? Highest education level: Not on file  ?Occupational History  ? Occupation: Retired  ?Tobacco Use  ? Smoking status: Never  ? Smokeless tobacco: Never  ?Vaping Use  ? Vaping Use: Never used  ?Substance and Sexual Activity  ? Alcohol use: Yes  ?  Comment: rarely  ? Drug use: No  ? Sexual activity: Yes  ?  Partners: Male  ?Other Topics Concern  ? Not on file  ?Social History Narrative  ? Not on file  ? ?Social Determinants of Health  ? ?Financial Resource Strain: Low Risk   ? Difficulty of Paying Living Expenses: Not hard at all  ?Food Insecurity: No Food Insecurity  ? Worried About Charity fundraiser in the Last Year: Never true  ? Ran Out of Food in the Last Year: Never true  ?Transportation Needs: No Transportation Needs  ? Lack of Transportation (Medical): No  ? Lack of Transportation (Non-Medical): No  ?Physical Activity: Insufficiently Active  ? Days of Exercise per Week: 4 days  ? Minutes of Exercise per Session: 10 min  ?Stress: No Stress Concern Present  ? Feeling of Stress : Not at all  ?Social Connections: Moderately Integrated  ? Frequency of Communication with Friends and  Family: More than three times a week  ? Frequency of Social Gatherings with Friends and Family: Three times a week  ? Attends Religious Services: 1 to 4 times per year  ? Active Member of Clubs or Organizations: Yes  ? Attends Archivist Meetings: More than 4 times per year  ? Marital Status: Widowed  ?Intimate Partner Violence: Not on file  ? ? ?Review of Systems  ?Constitutional:  Negative for fatigue and unexpected weight change.  ?Respiratory:  Negative for chest tightness and shortness of breath.   ?  Cardiovascular:  Negative for chest pain, palpitations and leg swelling.  ?Gastrointestinal:  Negative for abdominal pain and blood in stool.  ?Neurological:  Negative for dizziness, syncope, light-headedness and headaches.  ? ? ?Objective:  ? ?Vitals:  ? 07/31/21 1257  ?BP: (!) 148/84  ?Pulse: 80  ?Resp: 18  ?Temp: 98 ?F (36.7 ?C)  ?TempSrc: Temporal  ?Weight: 271 lb (122.9 kg)  ?Height: '5\' 8"'$  (1.727 m)  ? ? ? ?Physical Exam ?Vitals reviewed.  ?Constitutional:   ?   Appearance: Normal appearance. She is well-developed.  ?HENT:  ?   Head: Normocephalic and atraumatic.  ?   Left Ear: Tympanic membrane and ear canal normal.  ?   Ears:  ?   Comments: Pinna nontender.  External ear appears normal.  Canal on the right with no erythema proximally, exudate within the inferior aspect of the canal, yellow.  Erythematous, dull TM.  No visible rupture.  No exudate or blood in the distal canal. ?Eyes:  ?   Conjunctiva/sclera: Conjunctivae normal.  ?   Pupils: Pupils are equal, round, and reactive to light.  ?Neck:  ?   Vascular: No carotid bruit.  ?Cardiovascular:  ?   Rate and Rhythm: Normal rate and regular rhythm.  ?   Heart sounds: Normal heart sounds.  ?Pulmonary:  ?   Effort: Pulmonary effort is normal.  ?   Breath sounds: Normal breath sounds.  ?Abdominal:  ?   Palpations: Abdomen is soft. There is no pulsatile mass.  ?   Tenderness: There is no abdominal tenderness.  ?Musculoskeletal:  ?   Right lower leg: No  edema.  ?   Left lower leg: No edema.  ?Skin: ?   General: Skin is warm and dry.  ?Neurological:  ?   Mental Status: She is alert and oriented to person, place, and time.  ?Psychiatric:     ?   Mood and

## 2021-08-06 ENCOUNTER — Ambulatory Visit (INDEPENDENT_AMBULATORY_CARE_PROVIDER_SITE_OTHER): Payer: Medicare Other | Admitting: Family Medicine

## 2021-08-06 DIAGNOSIS — I10 Essential (primary) hypertension: Secondary | ICD-10-CM

## 2021-08-06 NOTE — Progress Notes (Signed)
Taken by office 138/68 Lt and Rt arm.  ? ?Pt monitor reported 158/89 Lt and 148/88 Rt  ? ?Pt was advised to replace monitor as hers is apx 76 years old and pt will return if other concerns  ?

## 2021-08-16 ENCOUNTER — Ambulatory Visit (INDEPENDENT_AMBULATORY_CARE_PROVIDER_SITE_OTHER): Payer: Medicare Other | Admitting: Family Medicine

## 2021-08-16 ENCOUNTER — Encounter: Payer: Self-pay | Admitting: Family Medicine

## 2021-08-16 VITALS — BP 140/72 | HR 74 | Temp 98.1°F | Resp 16 | Wt 270.8 lb

## 2021-08-16 DIAGNOSIS — H6691 Otitis media, unspecified, right ear: Secondary | ICD-10-CM

## 2021-08-16 DIAGNOSIS — Z780 Asymptomatic menopausal state: Secondary | ICD-10-CM | POA: Diagnosis not present

## 2021-08-16 DIAGNOSIS — I10 Essential (primary) hypertension: Secondary | ICD-10-CM | POA: Diagnosis not present

## 2021-08-16 MED ORDER — CLOTRIMAZOLE 1 % EX SOLN
CUTANEOUS | 0 refills | Status: DC
Start: 2021-08-16 — End: 2022-09-12

## 2021-08-16 MED ORDER — LOSARTAN POTASSIUM 50 MG PO TABS: 100.0000 mg | ORAL_TABLET | Freq: Every day | ORAL | 1 refills | Status: DC

## 2021-08-16 NOTE — Progress Notes (Signed)
? ?  Subjective:  ? ? Patient ID: Shelby Reeves, female    DOB: [birth year], 76 y.o.   MRN: [id number] ? ?HPI ?HTN- chronic problem, currently on Losartan '50mg'$  daily and HCTZ 12.'5mg'$  daily.  Was seen in ER on 4/3 b/c BP on 4/2 was ~200.  Highest in ER 166/103.  Today BP is 140/72.  She reports that recent home BPs have been 130-140s/70-80s.  No CP, SOB.  + HA- 'since the first of the year b/c of tension'.  No visual changes.  BP elevation was precipitated by Phentermine.   ? ?R ear pain- pt has completed 2 rounds of abx for OE.  Was referred to ENT but the soonest the can see her is late July ? ? ?Review of Systems ?For ROS see HPI  ?   ?Objective:  ? Physical Exam ?Vitals reviewed.  ?Constitutional:   ?   General: She is not in acute distress. ?   Appearance: Normal appearance. She is well-developed. She is obese. She is not ill-appearing.  ?HENT:  ?   Head: Normocephalic and atraumatic.  ?   Left Ear: Tympanic membrane and ear canal normal.  ?   Ears:  ?   Comments: Copious debris in R EAC consistent w/ fungal infxn ?Eyes:  ?   Conjunctiva/sclera: Conjunctivae normal.  ?   Pupils: Pupils are equal, round, and reactive to light.  ?Neck:  ?   Thyroid: No thyromegaly.  ?Cardiovascular:  ?   Rate and Rhythm: Normal rate and regular rhythm.  ?   Pulses: Normal pulses.  ?   Heart sounds: Normal heart sounds. No murmur heard. ?Pulmonary:  ?   Effort: Pulmonary effort is normal. No respiratory distress.  ?   Breath sounds: Normal breath sounds.  ?Abdominal:  ?   General: There is no distension.  ?   Palpations: Abdomen is soft.  ?   Tenderness: There is no abdominal tenderness.  ?Musculoskeletal:  ?   Cervical back: Normal range of motion and neck supple.  ?   Right lower leg: No edema.  ?   Left lower leg: No edema.  ?Lymphadenopathy:  ?   Cervical: No cervical adenopathy.  ?Skin: ?   General: Skin is warm and dry.  ?Neurological:  ?   Mental Status: She is alert and oriented to person, place, and time.  ?Psychiatric:     ?    Behavior: Behavior normal.  ? ? ? ? ? ?   ?Assessment & Plan:  ? ?R ear infxn- pt has completed multiple rounds of abx but continues to struggle w/ R ear pain and drainage.  On exam, it appears R ear has a fungal infxn that will need to be thoroughly cleaned/debrided.  Unfortunately we don't have the instruments for this.  Pt reports ENT appt is not until late July.  Will work w/ referral coordinator to get pt a sooner appt and start liquid antifungal preparation.  Pt expressed understanding and is in agreement w/ plan.  ?

## 2021-08-16 NOTE — Patient Instructions (Signed)
Follow up in 4-6 weeks to recheck BP ?INCREASE your Losartan to 2 tabs ('100mg'$  daily) ?IF the increased dose of Losartan is too much, decrease back to 1 tab or 1.5 tabs daily ?CONTINUE the HCTZ daily ?Call with any questions or concerns ?Have a great weekend!!! ?

## 2021-08-20 ENCOUNTER — Other Ambulatory Visit: Payer: Self-pay | Admitting: Family Medicine

## 2021-08-20 DIAGNOSIS — Z1239 Encounter for other screening for malignant neoplasm of breast: Secondary | ICD-10-CM

## 2021-08-20 NOTE — Assessment & Plan Note (Signed)
Deteriorated.  Pt's BP skyrocketed after starting Phentermine.  Thankfully BPs are coming down but she is still not within range.  Based on this, will increase Losartan to '100mg'$  daily and continue her HCTZ 12.'5mg'$  daily.  Reviewed sxs of hypotension and that if she feels BP is overcontrolled she can decrease her Losartan to either '50mg'$  or '75mg'$  daily.  Pt expressed understanding and is in agreement w/ plan.  ?

## 2021-09-02 DIAGNOSIS — H02403 Unspecified ptosis of bilateral eyelids: Secondary | ICD-10-CM | POA: Diagnosis not present

## 2021-09-02 DIAGNOSIS — Z961 Presence of intraocular lens: Secondary | ICD-10-CM | POA: Diagnosis not present

## 2021-09-02 DIAGNOSIS — H04123 Dry eye syndrome of bilateral lacrimal glands: Secondary | ICD-10-CM | POA: Diagnosis not present

## 2021-09-02 DIAGNOSIS — H40013 Open angle with borderline findings, low risk, bilateral: Secondary | ICD-10-CM | POA: Diagnosis not present

## 2021-09-02 LAB — HM DIABETES EYE EXAM

## 2021-09-03 ENCOUNTER — Encounter (HOSPITAL_BASED_OUTPATIENT_CLINIC_OR_DEPARTMENT_OTHER): Payer: Self-pay

## 2021-09-03 ENCOUNTER — Ambulatory Visit (HOSPITAL_BASED_OUTPATIENT_CLINIC_OR_DEPARTMENT_OTHER)
Admission: RE | Admit: 2021-09-03 | Discharge: 2021-09-03 | Disposition: A | Discharge status: Home / Self Care | Payer: Medicare Other | Source: Ambulatory Visit | Attending: Family Medicine | Admitting: Family Medicine

## 2021-09-03 DIAGNOSIS — Z780 Asymptomatic menopausal state: Secondary | ICD-10-CM | POA: Insufficient documentation

## 2021-09-03 DIAGNOSIS — Z1239 Encounter for other screening for malignant neoplasm of breast: Secondary | ICD-10-CM | POA: Insufficient documentation

## 2021-09-03 DIAGNOSIS — Z1231 Encounter for screening mammogram for malignant neoplasm of breast: Secondary | ICD-10-CM | POA: Insufficient documentation

## 2021-09-03 DIAGNOSIS — Z13820 Encounter for screening for osteoporosis: Secondary | ICD-10-CM | POA: Insufficient documentation

## 2021-09-04 ENCOUNTER — Telehealth: Payer: Self-pay

## 2021-09-04 NOTE — Telephone Encounter (Signed)
Spoke w/ pt and advised of her scan results  ?

## 2021-09-04 NOTE — Telephone Encounter (Signed)
-----   Message from [name], MD sent at 09/03/2021  4:52 PM EDT ----- ?Your bone density is normal and has actually improved when compared to the scan done 2 yrs ago.  This is great news! ?

## 2021-09-05 NOTE — Progress Notes (Signed)
Chronic Care Management Pharmacy Note  09/18/2021 Name:  Shelby Reeves MRN:  [id number] DOB:  [birth year]  Summary: BP well controlled since taking 137m losartan.  Patient reports 1333Lsystolic.  Denies dizziness or any instanced of BP dropping too low.  Overall doing well  Subjective: Shelby Reeves an 76y.o. year old female who is a primary patient of Tabori, KAundra Millet MD.  The CCM team was consulted for assistance with disease management and care coordination needs.    Engaged with patient by telephone for follow up visit in response to provider referral for pharmacy case management and/or care coordination services.   Consent to Services:  The patient was given information about Chronic Care Management services, agreed to services, and gave verbal consent prior to initiation of services.  Please see initial visit note for detailed documentation.   Patient Care Team: TMidge Minium MD as PCP - GJudie Petit MD as Consulting Physician (Gastroenterology) SJerrell Belfast MD as Consulting Physician (Otolaryngology) WKonrad Felix MD as Referring Physician (Ophthalmology) [facility])  Hospital visits: None in previous 6 months  Objective:  Lab Results  Component Value Date   CREATININE 0.74 07/29/2021   CREATININE 0.90 07/10/2021   CREATININE 0.75 01/09/2021    Lab Results  Component Value Date   HGBA1C 6.1 07/12/2021   HGBA1C 6.0 02/14/2016   HGBA1C 5.9 (H) 02/09/2015   Last diabetic Eye exam:  Lab Results  Component Value Date/Time   HMDIABEYEEXA No Retinopathy 09/02/2021 12:00 AM    Last diabetic Foot exam: No results found for: HMDIABFOOTEX      Component Value Date/Time   CHOL 116 07/10/2021 0935   CHOL 130 01/09/2021 1026   CHOL 115 07/09/2020 1057   TRIG 71.0 07/10/2021 0935   TRIG 85.0 01/09/2021 1026   TRIG 79.0 07/09/2020 1057   HDL 50.60 07/10/2021 0935   HDL 53.20 01/09/2021 1026   HDL 44.10  07/09/2020 1057   CHOLHDL 2 07/10/2021 0935   VLDL 14.2 07/10/2021 0935   LDLCALC 52 07/10/2021 0935   LDLCALC 59 01/09/2021 1026   LDLCALC 55 07/09/2020 1057       Latest Ref Rng & Units 07/10/2021    9:35 AM 01/09/2021   10:26 AM 07/09/2020   10:57 AM  Hepatic Function  Total Protein 6.0 - 8.3 g/dL 6.8   6.6   7.2    Albumin 3.5 - 5.2 g/dL 4.5   4.1   4.0    AST 0 - 37 U/L 22   18   20     ALT 0 - 35 U/L 23   19   21     Alk Phosphatase 39 - 117 U/L 68   68   68    Total Bilirubin 0.2 - 1.2 mg/dL 1.3   1.3   0.9    Bilirubin, Direct 0.0 - 0.3 mg/dL 0.3   0.2   0.1      Lab Results  Component Value Date/Time   TSH 2.29 07/10/2021 09:35 AM   TSH 3.77 01/09/2021 10:26 AM       Latest Ref Rng & Units 07/29/2021   10:03 AM 07/10/2021    9:35 AM 01/09/2021   10:26 AM  CBC  WBC 4.0 - 10.5 K/uL 2.9   3.6   3.5    Hemoglobin 12.0 - 15.0 g/dL 14.7   14.6   13.7    Hematocrit 36.0 - 46.0 % 42.1  43.0   40.5    Platelets 150 - 400 K/uL 244   234.0   216.0      Lab Results  Component Value Date/Time   VD25OH 28.36 (L) 07/10/2021 09:35 AM    Clinical ASCVD:  The ASCVD Risk score (Arnett DK, et al., 2019) failed to calculate for the following reasons:   The valid total cholesterol range is 130 to 320 mg/dL   Social History   Tobacco Use  Smoking Status Never  Smokeless Tobacco Never   BP Readings from Last 3 Encounters:  08/16/21 140/72  07/31/21 (!) 148/84  07/29/21 125/76   Pulse Readings from Last 3 Encounters:  08/16/21 74  07/31/21 80  07/29/21 78   Wt Readings from Last 3 Encounters:  08/16/21 270 lb 12.8 oz (122.8 kg)  07/31/21 271 lb (122.9 kg)  07/10/21 278 lb 9.6 oz (126.4 kg)   BMI Readings from Last 3 Encounters:  08/16/21 41.17 kg/m  07/31/21 41.21 kg/m  07/10/21 42.36 kg/m   Assessment: Review of patient past medical history, allergies, medications, health status, including review of consultants reports, laboratory and other test data, was  performed as part of comprehensive evaluation and provision of chronic care management services.   SDOH:  (Social Determinants of Health) assessments and interventions performed: Yes   CCM Care Plan  Allergies  Allergen Reactions   Iodine Hives   Shellfish Allergy Hives    Medications Reviewed Today     Reviewed by Edythe Clarity, [facility] (Pharmacist) on 09/18/21 at 1422  Med List Status: <None>   Medication Order Taking? Sig Documenting Provider Last Dose Status Informant  aspirin 81 MG tablet [id number] Yes Take 81 mg by mouth daily. [provider] Taking Active   clotrimazole (LOTRIMIN) 1 % external solution [id number] Yes 3 drops in R ear twice daily x14 days [name], MD Taking Active   hydrochlorothiazide (HYDRODIURIL) 12.5 MG tablet [id number] Yes TAKE 1 TO 2 TABLETS BY MOUTH EVERY DAY DEPENDING ON SWELLING [name], MD Taking Active     Discontinued 07/21/11 1338 (Side effect (s))   losartan (COZAAR) 50 MG tablet [id number] Yes Take 2 tablets (100 mg total) by mouth daily. [name], MD Taking Active   Multiple Vitamin (MULTIVITAMIN) tablet [id number] Yes Take 1 tablet by mouth daily. [provider] Taking Active   Multiple Vitamins-Minerals (HAIR SKIN AND NAILS FORMULA PO) [id number] Yes Take by mouth. [provider] Taking Active   pantoprazole (PROTONIX) 40 MG tablet [id number] Yes Take 1 tablet (40 mg total) by mouth daily. [name], MD Taking Active   pravastatin (PRAVACHOL) 40 MG tablet [id number] Yes TAKE 1 TABLET(40 MG) BY MOUTH DAILY [name], MD Taking Active             Patient Active Problem List   Diagnosis Date Noted   Cyst of finger 10/03/2015   Severe obesity (BMI >= 40) ([place]) 12/07/2013   Ventral hernia 02/25/2013   Paresthesia of left arm 09/23/2012   Generalized anxiety disorder 09/23/2012   General medical examination 02/12/2011   Back pain 08/27/2010   ORTHOSTATIC  HYPOTENSION 06/28/2010   VERTIGO 06/28/2010   Hyperlipidemia 05/06/2010   Essential hypertension 05/06/2010   RHINITIS 05/06/2010    Immunization History  Administered Date(s) Administered   Fluad Quad(high Dose 65+) 12/31/2018, 01/10/2020, 01/09/2021   Influenza Split 02/12/2011, 04/29/2012   Influenza, High Dose Seasonal PF 02/18/2017, 01/27/2018   Influenza,inj,Quad PF,6+ Mos 01/26/2013, 01/05/2014, 02/14/2016  PFIZER(Purple Top)SARS-COV-2 Vaccination 05/20/2019, 06/08/2019   Pneumococcal Conjugate-13 02/07/2014   Pneumococcal Polysaccharide-23 03/17/2008, 08/13/2016   Tdap 12/31/2018   Zoster Recombinat (Shingrix) 04/03/2017, 07/20/2017    Conditions to be addressed/monitored: HLD HTN Obesity   Care Plan : ccm pharmacy care plan  Updates made by Edythe Clarity, RPH since 09/18/2021 12:00 AM     Problem: HLD HTN Obesity   Priority: High     Long-Range Goal: Disease Management   Start Date: 08/09/2020  Expected End Date: 08/09/2021  Recent Progress: On track  Priority: High  Note:    Current Barriers:  Working towards optimizing diet (lower carb/fat) and maintaining routine exercise (walking, stationary bike). No Medication related barriers at this time  Pharmacist Clinical Goal(s):  Patient will verbalize ability to afford treatment regimen through collaboration with PharmD and provider.   Interventions: 1:1 collaboration with [name], MD regarding development and update of comprehensive plan of care as evidenced by provider attestation and co-signature Inter-disciplinary care team collaboration (see longitudinal plan of care) Comprehensive medication review performed; medication list updated in electronic medical record  Hypertension (BP goal <130/80) -Controlled -Hypotension Hx -Current treatment: Losartan 100 mg once daily Appropriate, Effective, Safe, Accessible HCTZ 12.5 mg - 1-2 tablets once daily Appropriate, Effective, Safe,  Accessible -Current home readings: at goal historically, has not taken recent but has cuff.  -Current dietary habits: following lower carb diet, avoids bread. -Current exercise habits: doing best to stay active/on feet, some walking. Hoping to make use of stationary bike at home and get in habit of riding every day. Down 20 lb since last year per patient. -Denies recent hypotensive/hypertensive symptoms, no recent falls -Exercise goal of 150 minutes per week -No issues with compliance/no fill gaps noted -Recommended to continue current medication  Update 09/18/21 Home BP - 125-126/78. She is tolerating increased dose of Losartan to 129m well.  She denies any dizziness or HA.  She has not recorded BP or checked today. BP was elevated after taking phentermine.  She d/c medication and had continued elevated BP.  No issues since most recent dose change. No changes - continue to monitor. Will fu in one year but have CMA check in periodically on BP to ensure adequate monitoring at home.   Hyperlipidemia: (LDL goal < 70-100) -Controlled -Primary prevention, HLD and HTN well controlled. On asa 81 mg. -Current treatment: Pravastatin 40 mg once daily  Appropriate, Effective, Safe, Accessible -Tolerating without side effects, no issues with compliance -Educated on Cholesterol goals -Recommended to continue current medication  Update 09/18/21 She is taking medication in the morning.  Taking at night may be the best way, however, this causes her to miss doses.  I would rather her take it daily and have 100% adherence rather than stress about timing.  Would refer to most recent lipid panel showing adequate control and make no changes to recommendations for medication timing. Tolerating well Continue routine screening.  Acid Reflux (Goal: minimize symptoms) -Controlled -Reports significant improvement in reflux symptoms after losing weight -Current treatment  Pantoprazole 40 mg once  daily -Recommended to continue current medication,   Patient Goals/Self-Care Activities Patient will:  - take medications as prescribed target a minimum of 150 minutes of moderate intensity exercise weekly  Follow Up Plan: RKindredf/u telephone visit 1 year  Medication Assistance: None required.  Patient affirms current coverage meets needs.  [id number] [address] [place] [address] Phone: [phone] Fax: [phone]  Follow Up:  Patient agrees to Care Plan and Follow-up.       Follow Up:  Patient agrees to Care Plan and Follow-up. Plan: HC 1 month pt call on diet/exercise changes. Pharmacist 1 year f/u telephone appt.  Future Appointments  Date Time Provider [place]  01/10/2022 11:00 AM [name], MD [facility], PharmD Clinical Pharmacist  [facility] [phone]

## 2021-09-09 DIAGNOSIS — H6091 Unspecified otitis externa, right ear: Secondary | ICD-10-CM | POA: Insufficient documentation

## 2021-09-09 DIAGNOSIS — Z8669 Personal history of other diseases of the nervous system and sense organs: Secondary | ICD-10-CM | POA: Diagnosis not present

## 2021-09-09 DIAGNOSIS — H73891 Other specified disorders of tympanic membrane, right ear: Secondary | ICD-10-CM | POA: Diagnosis not present

## 2021-09-09 DIAGNOSIS — Z974 Presence of external hearing-aid: Secondary | ICD-10-CM | POA: Diagnosis not present

## 2021-09-18 ENCOUNTER — Ambulatory Visit: Payer: Medicare Other | Admitting: Pharmacist

## 2021-09-18 DIAGNOSIS — I10 Essential (primary) hypertension: Secondary | ICD-10-CM

## 2021-09-18 DIAGNOSIS — E785 Hyperlipidemia, unspecified: Secondary | ICD-10-CM

## 2021-09-18 NOTE — Patient Instructions (Addendum)
Visit Information   Goals Addressed             This Visit's Progress    Track and Manage My Blood Pressure-Hypertension       Timeframe:  Long-Range Goal Priority:  High Start Date: 09/18/21                            Expected End Date: 03/21/22                      Follow Up Date 12/19/21    Track and manage BP since changing to new dose of losartan.    Why is this important?   You won't feel high blood pressure, but it can still hurt your blood vessels.  High blood pressure can cause heart or kidney problems. It can also cause a stroke.  Making lifestyle changes like losing a little weight or eating less salt will help.  Checking your blood pressure at home and at different times of the day can help to control blood pressure.  If the doctor prescribes medicine remember to take it the way the doctor ordered.  Call the office if you cannot afford the medicine or if there are questions about it.     Notes:        Patient Care Plan: ccm pharmacy care plan     Problem Identified: HLD HTN Obesity   Priority: High     Long-Range Goal: Disease Management   Start Date: 08/09/2020  Expected End Date: 08/09/2021  Recent Progress: On track  Priority: High  Note:    Current Barriers:  Working towards optimizing diet (lower carb/fat) and maintaining routine exercise (walking, stationary bike). No Medication related barriers at this time  Pharmacist Clinical Goal(s):  Patient will verbalize ability to afford treatment regimen through collaboration with PharmD and provider.   Interventions: 1:1 collaboration with [name], MD regarding development and update of comprehensive plan of care as evidenced by provider attestation and co-signature Inter-disciplinary care team collaboration (see longitudinal plan of care) Comprehensive medication review performed; medication list updated in electronic medical record  Hypertension (BP goal  <130/80) -Controlled -Hypotension Hx -Current treatment: Losartan 100 mg once daily Appropriate, Effective, Safe, Accessible HCTZ 12.5 mg - 1-2 tablets once daily Appropriate, Effective, Safe, Accessible -Current home readings: at goal historically, has not taken recent but has cuff.  -Current dietary habits: following lower carb diet, avoids bread. -Current exercise habits: doing best to stay active/on feet, some walking. Hoping to make use of stationary bike at home and get in habit of riding every day. Down 20 lb since last year per patient. -Denies recent hypotensive/hypertensive symptoms, no recent falls -Exercise goal of 150 minutes per week -No issues with compliance/no fill gaps noted -Recommended to continue current medication  Update 09/18/21 Home BP - 125-126/78. She is tolerating increased dose of Losartan to '100mg'$  well.  She denies any dizziness or HA.  She has not recorded BP or checked today. BP was elevated after taking phentermine.  She d/c medication and had continued elevated BP.  No issues since most recent dose change. No changes - continue to monitor. Will fu in one year but have CMA check in periodically on BP to ensure adequate monitoring at home.   Hyperlipidemia: (LDL goal < 70-100) -Controlled -Primary prevention, HLD and HTN well controlled. On asa 81 mg. -Current treatment: Pravastatin 40 mg once daily  Appropriate, Effective,  Safe, Accessible -Tolerating without side effects, no issues with compliance -Educated on Cholesterol goals -Recommended to continue current medication  Update 09/18/21 She is taking medication in the morning.  Taking at night may be the best way, however, this causes her to miss doses.  I would rather her take it daily and have 100% adherence rather than stress about timing.  Would refer to most recent lipid panel showing adequate control and make no changes to recommendations for medication timing. Tolerating well Continue  routine screening.  Acid Reflux (Goal: minimize symptoms) -Controlled -Reports significant improvement in reflux symptoms after losing weight -Current treatment  Pantoprazole 40 mg once daily -Recommended to continue current medication,   Patient Goals/Self-Care Activities Patient will:  - take medications as prescribed target a minimum of 150 minutes of moderate intensity exercise weekly  Follow Up Plan: [place] f/u telephone visit 1 year  Medication Assistance: None required.  Patient affirms current coverage meets needs.  Patient's preferred pharmacy is:  [facility] [place] [place] [address] [place] [address] Phone: [phone] Fax: [phone]  Follow Up:  Patient agrees to Care Plan and Follow-up.         The patient verbalized understanding of instructions, educational materials, and care plan provided today and DECLINED offer to receive copy of patient instructions, educational materials, and care plan.  Telephone follow up appointment with pharmacy team member scheduled for: 1 year  Edythe Clarity, [place], PharmD Clinical Pharmacist  [facility] [phone]

## 2021-09-30 DIAGNOSIS — H938X1 Other specified disorders of right ear: Secondary | ICD-10-CM | POA: Diagnosis not present

## 2021-09-30 DIAGNOSIS — H6091 Unspecified otitis externa, right ear: Secondary | ICD-10-CM | POA: Diagnosis not present

## 2021-11-21 ENCOUNTER — Other Ambulatory Visit: Payer: Self-pay

## 2021-11-21 DIAGNOSIS — I10 Essential (primary) hypertension: Secondary | ICD-10-CM

## 2021-11-21 MED ORDER — LOSARTAN POTASSIUM 50 MG PO TABS: 100.0000 mg | ORAL_TABLET | Freq: Every day | ORAL | 1 refills | Status: DC

## 2021-11-25 ENCOUNTER — Other Ambulatory Visit: Payer: Self-pay

## 2021-11-25 DIAGNOSIS — I10 Essential (primary) hypertension: Secondary | ICD-10-CM

## 2021-11-25 DIAGNOSIS — E785 Hyperlipidemia, unspecified: Secondary | ICD-10-CM

## 2021-11-25 MED ORDER — PRAVASTATIN SODIUM 40 MG PO TABS: ORAL_TABLET | ORAL | 1 refills | Status: DC

## 2021-11-25 MED ORDER — HYDROCHLOROTHIAZIDE 12.5 MG PO TABS: ORAL_TABLET | ORAL | 1 refills | Status: DC

## 2021-12-23 ENCOUNTER — Encounter: Payer: Self-pay | Admitting: Family Medicine

## 2021-12-23 ENCOUNTER — Ambulatory Visit (INDEPENDENT_AMBULATORY_CARE_PROVIDER_SITE_OTHER): Payer: Medicare Other | Admitting: Family Medicine

## 2021-12-23 VITALS — BP 126/80 | HR 69 | Temp 98.1°F | Resp 16 | Ht 68.0 in | Wt 262.4 lb

## 2021-12-23 DIAGNOSIS — H539 Unspecified visual disturbance: Secondary | ICD-10-CM | POA: Diagnosis not present

## 2021-12-23 DIAGNOSIS — R42 Dizziness and giddiness: Secondary | ICD-10-CM

## 2021-12-23 DIAGNOSIS — M542 Cervicalgia: Secondary | ICD-10-CM | POA: Diagnosis not present

## 2021-12-23 MED ORDER — MECLIZINE HCL 25 MG PO TABS: 25.0000 mg | ORAL_TABLET | Freq: Three times a day (TID) | ORAL | 0 refills | Status: DC | PRN

## 2021-12-23 MED ORDER — TIZANIDINE HCL 4 MG PO TABS: 4.0000 mg | ORAL_TABLET | Freq: Three times a day (TID) | ORAL | 0 refills | Status: DC | PRN

## 2021-12-23 NOTE — Assessment & Plan Note (Signed)
Chronic problem.  Pt reports she is only having sxs when she turns her head quickly or lies down/rolls over.  This is consistent w/ positional vertigo.  Will give Meclizine to use as needed and refer to vestibular rehab.  Pt expressed understanding and is in agreement w/ plan.

## 2021-12-23 NOTE — Patient Instructions (Signed)
Follow up as needed or as scheduled We'll call you with your Vestibular Rehab appt START the Tizanidine for the neck spasm- may cause drowsiness so try in the evening first USE the Meclizine as needed for dizziness Drink LOTS of water Call and schedule an eye exam ASAP for the blurry vision Call with any questions or concerns Hang in there!!!

## 2021-12-23 NOTE — Progress Notes (Signed)
   Subjective:    Patient ID: Shelby Reeves, female    DOB: December 15, 1945, 76 y.o.   MRN: [id number]  HPI R sided neck pain- pt reports that when she turns her head to the R she has a 'catch' in her neck.  Sxs started a few months ago.  Described as a 1-2/10 on pain scale.  'it's like your muscle stretching'.  Vertigo- sxs started 'years ago'.  Pt reports feeling 'off'.  Has spinning when lying down or turning head too fast.  No nausea.  Visual changes- pt reports she woke Saturday w/ some blurry vision.  Distance vision was somewhat blurry, is able to see close up w/o difficulty.  Has monovision contacts- 1 for near, 1 for far.  Yesterday afternoon sxs had resolved.  Today has mild sxs.  No eye pain.  Visual changes accompanied some mild dizziness.  Sees Dr Shelby Reeves office.     Review of Systems For ROS see HPI     Objective:   Physical Exam Vitals reviewed.  Constitutional:      General: She is not in acute distress.    Appearance: Normal appearance. She is obese. She is not ill-appearing.  HENT:     Head: Normocephalic and atraumatic.     Right Ear: Tympanic membrane and ear canal normal.     Left Ear: Tympanic membrane and ear canal normal.  Eyes:     Extraocular Movements: Extraocular movements intact.     Conjunctiva/sclera: Conjunctivae normal.     Pupils: Pupils are equal, round, and reactive to light.     Comments: Ptosis of R upper lid  Cardiovascular:     Rate and Rhythm: Normal rate and regular rhythm.  Pulmonary:     Effort: Pulmonary effort is normal. No respiratory distress.  Musculoskeletal:        General: Tenderness (over R upper trap w/ notable spasm) present.     Cervical back: Normal range of motion. Tenderness (TTP over R upper trap) present. No rigidity.  Lymphadenopathy:     Cervical: No cervical adenopathy.  Skin:    General: Skin is warm and dry.  Neurological:     General: No focal deficit present.     Mental Status: She is alert and oriented to  person, place, and time.     Cranial Nerves: No cranial nerve deficit.     Motor: No weakness.     Gait: Gait normal.  Psychiatric:        Mood and Affect: Mood normal.        Behavior: Behavior normal.        Thought Content: Thought content normal.           Assessment & Plan:   Visual changes- new.  Pt reports sxs started Saturday upon waking and had blurry distance vision.  She thought this resolved yesterday afternoon but is again having mild blurry vision today.  She has monovision contacts and unclear if there is a problem w/ her lens.  Encouraged her to schedule w/ eye doctor ASAP.  Also discussed sxs of a CVA/TIA- visual changes, weakness, slurred speech, confusion, word finding difficulties, etc.  Pt to go straight to ER for eval if these sxs arise.  Pt expressed understanding and is in agreement w/ plan.   Neck pain- new.  Pt has palpable trap spasm on R.  Start Tizanidine to use PRN.  Encouraged heat, stretching.  Pt expressed understanding and is in agreement w/ plan.

## 2022-01-06 NOTE — Therapy (Signed)
OUTPATIENT PHYSICAL THERAPY VESTIBULAR EVALUATION     Patient Name: Shelby Reeves MRN: [id number] DOB:03-02-1946, 76 y.o., female Today's Date: 01/06/2022  PCP: Annye Asa  REFERRING PROVIDER: Annye Asa    Past Medical History:  Diagnosis Date   Allergy    Carpal tunnel syndrome    Fibroid    GERD (gastroesophageal reflux disease)    History of colon polyps 2002   Hyperlipidemia    Hypertension    Vertigo    Past Surgical History:  Procedure Laterality Date   BIOPSY THYROID     2018   CARPAL TUNNEL RELEASE     bilateral   CATARACT EXTRACTION W/ INTRAOCULAR LENS  IMPLANT, BILATERAL Bilateral 2018   July and August   COLONOSCOPY     hx tubular adenomas 2003   ENDOMETRIAL BIOPSY  09-12-09   --benign/Dr. Romine   HYSTEROSCOPY  10-22-09   resection of polyps and D & C-Dr. Joan Flores   HYSTEROSCOPY  06-17-91   and D & C   KNEE ARTHROSCOPY  2010   right   POLYPECTOMY     UTERINE FIBROID SURGERY     Patient Active Problem List   Diagnosis Date Noted   Cyst of finger 10/03/2015   Severe obesity (BMI >= 40) ([place]) 12/07/2013   Ventral hernia 02/25/2013   Paresthesia of left arm 09/23/2012   Generalized anxiety disorder 09/23/2012   General medical examination 02/12/2011   Back pain 08/27/2010   ORTHOSTATIC HYPOTENSION 06/28/2010   VERTIGO 06/28/2010   Hyperlipidemia 05/06/2010   Essential hypertension 05/06/2010   RHINITIS 05/06/2010    ONSET DATE: 12/23/21  REFERRING DIAG: R42  THERAPY DIAG:  No diagnosis found.  Rationale for Evaluation and Treatment Rehabilitation  SUBJECTIVE:   SUBJECTIVE STATEMENT: About 2 weeks ago, I was traveling and one day I got up my vision was blurry. I have had vertigo before, but I didn't know there was therapy for it. Not getting dizzy anymore, unless I move too fast, mostly just having issues with balance.   Pt accompanied by: self  PERTINENT HISTORY: HTN, vertigo, bilateral carpal tunnel release   PAIN:  Are  you having pain? No  PRECAUTIONS: None and Fall  WEIGHT BEARING RESTRICTIONS No  FALLS: Has patient fallen in last 6 months? No  LIVING ENVIRONMENT: Lives with: lives alone Lives in: House/apartment Stairs: No Has following equipment at home: None  PLOF: Independent  PATIENT GOALS be able to walk straight without going off to the side   OBJECTIVE:   COGNITION: Overall cognitive status: Within functional limits for tasks assessed   SENSATION: WFL  POSTURE: rounded shoulders, forward head, and increased lumbar lordosis   Cervical ROM:  WFL   LOWER EXTREMITY MMT: grossly 5/5  GAIT: Gait pattern:  deviates from walking in straight line sometimes, trunk flexed, and wide BOS Assistive device utilized: None Level of assistance: Complete Independence  PATIENT SURVEYS:  FOTO 52   VESTIBULAR ASSESSMENT    SYMPTOM BEHAVIOR:   Type of dizziness: Imbalance (Disequilibrium) and Unsteady with head/body turns   Frequency: 1-2x a day    Duration: 30s-1 min I sit down and it goes away   Aggravating factors: Induced by position change: supine to sit and if I move in any direction to quick    Relieving factors: rest   Progression of symptoms: better   OCULOMOTOR EXAM:   Ocular Alignment: normal   Ocular ROM: No Limitations   Spontaneous Nystagmus: absent   Gaze-Induced Nystagmus: absent  Smooth Pursuits: intact   Saccades: intact   Convergence/Divergence: 1-2 in    VESTIBULAR - OCULAR REFLEX:    Slow VOR: Normal   VOR Cancellation: Normal   Dynamic Visual Acuity: Dynamic: normal    POSITIONAL TESTING: Right Dix-Hallpike: no nystagmus and slight dizziness but goes away quick   MOTION SENSITIVITY:    Motion Sensitivity Quotient  Intensity: 0 = none, 1 = Lightheaded, 2 = Mild, 3 = Moderate, 4 = Severe, 5 = Vomiting  Intensity  1. Sitting to supine 3  2. Supine to L side 2  3. Supine to R side 3  4. Supine to sitting 3  5. L Hallpike-Dix   6. Up from L    7. R  Hallpike-Dix 3  8. Up from R  3  9. Sitting, head  tipped to L knee 0  10. Head up from L  knee 1  11. Sitting, head  tipped to R knee 0  12. Head up from R  knee 1  13. Sitting head turns x5 0  14.Sitting head nods x5 2  15. In stance, 180  turn to L  2  16. In stance, 180  turn to R 2    OTHOSTATICS: not done  FUNCTIONAL GAIT: Berg Balance Scale: 46/56 Functional gait assessment: TBD   PATIENT EDUCATION: Education details: POC Person educated: Patient Education method: Explanation Education comprehension: verbalized understanding   GOALS: Goals reviewed with patient? Yes  SHORT TERM GOALS: Target date: 02/11/22  Patient will be independent with initial HEP. Goal status: INITIAL   LONG TERM GOALS: Target date: 03/18/22  Patient will be independent with advanced/ongoing HEP to improve outcomes and carryover.  Goal status: INITIAL  2.  Patient will report no dizziness with positional changes. Baseline: dizzy with quick movementse Goal status: INITIAL  3.  Patient will score 54 on Berg Balance test to demonstrate lower risk of falls.   Baseline: 46 Goal status: INITIAL  4.  Patient will report 95 on FOTO to demonstrate improved functional ability. Baseline: 52 Goal status: INITIAL  ASSESSMENT:  CLINICAL IMPRESSION: Patient is a 76 y.o. female who was seen today for physical therapy evaluation and treatment for vertigo. Her symptoms began ~2 weeks ago but have gotten significantly better. She presents with slight symptoms of vertigo, mostly having dizziness with quick movements. During BERG testing she had some dizziness with picking up object and returning upright and with 360 turn. Patient states she tends to lean one way when walking about wants to work on getting her balance right. Patient does not have nystagmus and had no symptoms with Dix-Hallpike except for slight dizziness that came and went int 5secs. Patient will benefit from skilled PT  intervention to address gait and balance abnormalities and decrease dizziness.   REHAB POTENTIAL: Good  CLINICAL DECISION MAKING: Stable/uncomplicated  EVALUATION COMPLEXITY: Low   PLAN: PT FREQUENCY: 1x/week  PT DURATION: 10 weeks  PLANNED INTERVENTIONS: Therapeutic exercises, Therapeutic activity, Neuromuscular re-education, Balance training, Gait training, Patient/Family education, Self Care, Joint mobilization, Vestibular training, Canalith repositioning, and Manual therapy  PLAN FOR NEXT SESSION: orthostatic testing, walking with head turns, walking beam, step on airex, standing VOR, [place]?   [place], PT 01/06/2022, 2:37 PM

## 2022-01-07 ENCOUNTER — Ambulatory Visit: Payer: Medicare Other | Attending: Family Medicine

## 2022-01-07 DIAGNOSIS — R42 Dizziness and giddiness: Secondary | ICD-10-CM

## 2022-01-07 DIAGNOSIS — R2689 Other abnormalities of gait and mobility: Secondary | ICD-10-CM

## 2022-01-07 DIAGNOSIS — R278 Other lack of coordination: Secondary | ICD-10-CM

## 2022-01-10 ENCOUNTER — Ambulatory Visit (INDEPENDENT_AMBULATORY_CARE_PROVIDER_SITE_OTHER): Payer: Medicare Other | Admitting: Family Medicine

## 2022-01-10 ENCOUNTER — Encounter: Payer: Self-pay | Admitting: Family Medicine

## 2022-01-10 VITALS — BP 130/74 | HR 63 | Temp 97.8°F | Resp 18 | Ht 68.0 in | Wt 262.8 lb

## 2022-01-10 DIAGNOSIS — E785 Hyperlipidemia, unspecified: Secondary | ICD-10-CM | POA: Diagnosis not present

## 2022-01-10 DIAGNOSIS — Z23 Encounter for immunization: Secondary | ICD-10-CM | POA: Diagnosis not present

## 2022-01-10 DIAGNOSIS — I10 Essential (primary) hypertension: Secondary | ICD-10-CM

## 2022-01-10 LAB — CBC WITH DIFFERENTIAL/PLATELET
Basophils Absolute: 0 10*3/uL (ref 0.0–0.1)
Basophils Relative: 0.8 % (ref 0.0–3.0)
Eosinophils Absolute: 0.1 10*3/uL (ref 0.0–0.7)
Eosinophils Relative: 2 % (ref 0.0–5.0)
HCT: 40.9 % (ref 36.0–46.0)
Hemoglobin: 14 g/dL (ref 12.0–15.0)
Lymphocytes Relative: 29.1 % (ref 12.0–46.0)
Lymphs Abs: 1 10*3/uL (ref 0.7–4.0)
MCHC: 34.3 g/dL (ref 30.0–36.0)
MCV: 98.2 fl (ref 78.0–100.0)
Monocytes Absolute: 0.3 10*3/uL (ref 0.1–1.0)
Monocytes Relative: 9.3 % (ref 3.0–12.0)
Neutro Abs: 2.1 10*3/uL (ref 1.4–7.7)
Neutrophils Relative %: 58.8 % (ref 43.0–77.0)
Platelets: 228 10*3/uL (ref 150.0–400.0)
RBC: 4.16 Mil/uL (ref 3.87–5.11)
RDW: 13.6 % (ref 11.5–15.5)
WBC: 3.6 10*3/uL — ABNORMAL LOW (ref 4.0–10.5)

## 2022-01-10 LAB — LIPID PANEL
Cholesterol: 126 mg/dL (ref 0–200)
HDL: 47.3 mg/dL (ref >39.00)
LDL Cholesterol: 59 mg/dL (ref 0–99)
NonHDL: 78.38
Total CHOL/HDL Ratio: 3
Triglycerides: 99 mg/dL (ref 0.0–149.0)
VLDL: 19.8 mg/dL (ref 0.0–40.0)

## 2022-01-10 LAB — BASIC METABOLIC PANEL
BUN: 17 mg/dL (ref 6–23)
CO2: 28 mEq/L (ref 19–32)
Calcium: 10.9 mg/dL — ABNORMAL HIGH (ref 8.4–10.5)
Chloride: 103 mEq/L (ref 96–112)
Creatinine, Ser: 0.81 mg/dL (ref 0.40–1.20)
GFR: 70 mL/min (ref >60.00)
Glucose, Bld: 85 mg/dL (ref 70–99)
Potassium: 4.1 mEq/L (ref 3.5–5.1)
Sodium: 139 mEq/L (ref 135–145)

## 2022-01-10 LAB — HEPATIC FUNCTION PANEL
ALT: 19 U/L (ref 0–35)
AST: 21 U/L (ref 0–37)
Albumin: 4.1 g/dL (ref 3.5–5.2)
Alkaline Phosphatase: 74 U/L (ref 39–117)
Bilirubin, Direct: 0.3 mg/dL (ref 0.0–0.3)
Total Bilirubin: 1.5 mg/dL — ABNORMAL HIGH (ref 0.2–1.2)
Total Protein: 7.3 g/dL (ref 6.0–8.3)

## 2022-01-10 LAB — TSH: TSH: 2.41 u[IU]/mL (ref 0.35–5.50)

## 2022-01-10 NOTE — Progress Notes (Signed)
   Subjective:    Patient ID: Shelby Reeves, female    DOB: [birth year], 76 y.o.   MRN: [id number]  HPI HTN- chronic problem, on HCTZ 12.'5mg'$  daily, Losartan '50mg'$  daily w/ adequate control.  No CP, SOB, HAs, visual changes, edema.  Hyperlipidemia- chronic problem, on Pravastatin '40mg'$  daily.  No abd pain, N/V.  Obesity- ongoing issue for pt.  BMI 39.96 but given her other medical issues this qualifies as morbid obesity.  Weight is stable since last visit.  Pt plans to resume riding her stationary bike.  She took the last week off due to stress.   Review of Systems For ROS see HPI     Objective:   Physical Exam Vitals reviewed.  Constitutional:      General: She is not in acute distress.    Appearance: Normal appearance. She is well-developed. She is obese. She is not ill-appearing.  HENT:     Head: Normocephalic and atraumatic.  Eyes:     Conjunctiva/sclera: Conjunctivae normal.     Pupils: Pupils are equal, round, and reactive to light.  Neck:     Thyroid: No thyromegaly.  Cardiovascular:     Rate and Rhythm: Normal rate and regular rhythm.     Pulses: Normal pulses.     Heart sounds: Normal heart sounds. No murmur heard. Pulmonary:     Effort: Pulmonary effort is normal. No respiratory distress.     Breath sounds: Normal breath sounds.  Abdominal:     General: There is no distension.     Palpations: Abdomen is soft.     Tenderness: There is no abdominal tenderness.  Musculoskeletal:     Cervical back: Normal range of motion and neck supple.     Right lower leg: No edema.     Left lower leg: No edema.  Lymphadenopathy:     Cervical: No cervical adenopathy.  Skin:    General: Skin is warm and dry.  Neurological:     Mental Status: She is alert and oriented to person, place, and time.  Psychiatric:        Behavior: Behavior normal.           Assessment & Plan:

## 2022-01-10 NOTE — Assessment & Plan Note (Signed)
Chronic problem.  On Pravastatin '40mg'$  daily w/o difficulty.  Check labs.  Adjust meds prn

## 2022-01-10 NOTE — Assessment & Plan Note (Signed)
Chronic problem.  Currently adequate control on HCTZ 12.'5mg'$  daily, Losartan '50mg'$  daily.  Asymptomatic.  Check labs due to ARB and diuretic.  No anticipated med changes.  Will follow.

## 2022-01-10 NOTE — Patient Instructions (Signed)
Schedule your complete physical in 6 months We'll notify you of your lab results and make any changes if needed Continue to work on healthy diet and regular exercise- you can do it! Call with any questions or concerns Stay Safe!  Stay Healthy! Happy Fall!!!

## 2022-01-10 NOTE — Assessment & Plan Note (Signed)
Ongoing issue for pt.  Weight is stable and BMI is 39.96 but given her other medical issues (HTN, hyperlipidemia) this qualifies as morbid obesity.  Encouraged low carb diet and regular exercise.  Check labs to risk stratify.  Will follow.

## 2022-01-13 NOTE — Progress Notes (Signed)
Informed pt of lab results  

## 2022-01-13 NOTE — Progress Notes (Signed)
Left pt a VM w/ lab results

## 2022-01-14 ENCOUNTER — Telehealth: Payer: Self-pay | Admitting: Family Medicine

## 2022-01-14 NOTE — Telephone Encounter (Signed)
Left message for patient to call back and schedule Medicare Annual Wellness Visit (AWV).   Please offer to do virtually or by telephone.  Left office number and my jabber [phone].  Last AWV:11/05/2020  Please schedule at anytime with Nurse Health Advisor.

## 2022-01-21 NOTE — Therapy (Incomplete)
OUTPATIENT PHYSICAL THERAPY VESTIBULAR TREATMENT     Patient Name: Shelby Reeves MRN: [id number] DOB:May 20, 1945, 76 y.o., female Today's Date: 01/22/2022  PCP: Annye Asa  REFERRING PROVIDER: Annye Asa   PT End of Session - 01/22/22 1059     Visit Number 2    Date for PT Re-Evaluation 03/18/22    Authorization Type [employer] Medicare    PT Start Time 1100    PT Stop Time 1155    PT Time Calculation (min) 55 min    Activity Tolerance Patient tolerated treatment well    Behavior During Therapy [facility] for tasks assessed/performed             Past Medical History:  Diagnosis Date   Allergy    Carpal tunnel syndrome    Fibroid    GERD (gastroesophageal reflux disease)    History of colon polyps 2002   Hyperlipidemia    Hypertension    Vertigo    Past Surgical History:  Procedure Laterality Date   BIOPSY THYROID     2018   CARPAL TUNNEL RELEASE     bilateral   CATARACT EXTRACTION W/ INTRAOCULAR LENS  IMPLANT, BILATERAL Bilateral 2018   July and August   COLONOSCOPY     hx tubular adenomas 2003   ENDOMETRIAL BIOPSY  09-12-09   --benign/Dr. Romine   HYSTEROSCOPY  10-22-09   resection of polyps and D & C-Dr. Joan Flores   HYSTEROSCOPY  06-17-91   and D & C   KNEE ARTHROSCOPY  2010   right   POLYPECTOMY     UTERINE FIBROID SURGERY     Patient Active Problem List   Diagnosis Date Noted   Cyst of finger 10/03/2015   Severe obesity (BMI >= 40) ([place]) 12/07/2013   Ventral hernia 02/25/2013   Paresthesia of left arm 09/23/2012   Generalized anxiety disorder 09/23/2012   General medical examination 02/12/2011   Back pain 08/27/2010   ORTHOSTATIC HYPOTENSION 06/28/2010   VERTIGO 06/28/2010   Hyperlipidemia 05/06/2010   Essential hypertension 05/06/2010   RHINITIS 05/06/2010    ONSET DATE: 12/23/21  REFERRING DIAG: R42  THERAPY DIAG:  Other abnormalities of gait and mobility  Dizziness and giddiness  Other lack of coordination  Rationale for  Evaluation and Treatment Rehabilitation  SUBJECTIVE:   SUBJECTIVE STATEMENT: I am doing good, not having dizziness at least not that I have noticed. Having some neck pain in the right side.   Pt accompanied by: self  PERTINENT HISTORY: HTN, vertigo, bilateral carpal tunnel release   PAIN:  Are you having pain? No  PRECAUTIONS: None and Fall  WEIGHT BEARING RESTRICTIONS No  FALLS: Has patient fallen in last 6 months? No  LIVING ENVIRONMENT: Lives with: lives alone Lives in: House/apartment Stairs: No Has following equipment at home: None  PLOF: Independent  PATIENT GOALS be able to walk straight without going off to the side   OBJECTIVE:   COGNITION: Overall cognitive status: Within functional limits for tasks assessed   SENSATION: WFL  POSTURE: rounded shoulders, forward head, and increased lumbar lordosis   Cervical ROM:  WFL   LOWER EXTREMITY MMT: grossly 5/5  GAIT: Gait pattern:  deviates from walking in straight line sometimes, trunk flexed, and wide BOS Assistive device utilized: None Level of assistance: Complete Independence  PATIENT SURVEYS:  FOTO 52   VESTIBULAR ASSESSMENT    SYMPTOM BEHAVIOR:   Type of dizziness: Imbalance (Disequilibrium) and Unsteady with head/body turns   Frequency: 1-2x a day  Duration: 30s-1 min I sit down and it goes away   Aggravating factors: Induced by position change: supine to sit and if I move in any direction to quick    Relieving factors: rest   Progression of symptoms: better   OCULOMOTOR EXAM:   Ocular Alignment: normal   Ocular ROM: No Limitations   Spontaneous Nystagmus: absent   Gaze-Induced Nystagmus: absent   Smooth Pursuits: intact   Saccades: intact   Convergence/Divergence: 1-2 in    VESTIBULAR - OCULAR REFLEX:    Slow VOR: Normal   VOR Cancellation: Normal   Dynamic Visual Acuity: Dynamic: normal    POSITIONAL TESTING: Right Dix-Hallpike: no nystagmus and slight dizziness but goes away  quick   MOTION SENSITIVITY:    Motion Sensitivity Quotient  Intensity: 0 = none, 1 = Lightheaded, 2 = Mild, 3 = Moderate, 4 = Severe, 5 = Vomiting  Intensity  1. Sitting to supine 3  2. Supine to L side 2  3. Supine to R side 3  4. Supine to sitting 3  5. L Hallpike-Dix   6. Up from L    7. R Hallpike-Dix 3  8. Up from R  3  9. Sitting, head  tipped to L knee 0  10. Head up from L  knee 1  11. Sitting, head  tipped to R knee 0  12. Head up from R  knee 1  13. Sitting head turns x5 0  14.Sitting head nods x5 2  15. In stance, 180  turn to L  2  16. In stance, 180  turn to R 2    OTHOSTATICS: not done  FUNCTIONAL GAIT: Berg Balance Scale: 46/56 Functional gait assessment: TBD  TODAY'S TREATMENT Check orthostatics  - Supine 147/85 - Sitting 120/87 - Standing 132/90 UBE L2 x4 mins  UT and scalenes stretch 30s  Shoulder ext 5# 2x10  Step up on airex,cues to clear feet to prevent tripping over it  Walk on beam side steps and narrow  Walking with head turns     PATIENT EDUCATION: Education details: POC Person educated: Patient Education method: Explanation Education comprehension: verbalized understanding   GOALS: Goals reviewed with patient? Yes  SHORT TERM GOALS: Target date: 02/11/22  Patient will be independent with initial HEP. Goal status: INITIAL   LONG TERM GOALS: Target date: 03/18/22  Patient will be independent with advanced/ongoing HEP to improve outcomes and carryover.  Goal status: INITIAL  2.  Patient will report no dizziness with positional changes. Baseline: dizzy with quick movements Goal status: INITIAL  3.  Patient will score 54 on Berg Balance test to demonstrate lower risk of falls.   Baseline: 46 Goal status: INITIAL  4.  Patient will report 60 on FOTO to demonstrate improved functional ability. Baseline: 52 Goal status: INITIAL  ASSESSMENT:  CLINICAL IMPRESSION: We checked orthostatics today to see if that could  be the root of her dizziness. However, she is not symptomatic with testing and only has a drop going from supine to sitting. Patient is not having many vestibular symptoms as she was and complains mostly about R sided neck pain. So we worked on some stretches and exercises for the neck and did some balance training. Would benefit from some more balance training. Has slight dizziness with walking with head turns due to the fast movements. Ended session with dry needling to R UT to help with trigger points and tightness- jump signs with dry needling.    REHAB POTENTIAL: Good  CLINICAL DECISION MAKING: Stable/uncomplicated  EVALUATION COMPLEXITY: Low   PLAN: PT FREQUENCY: 1x/week  PT DURATION: 10 weeks  PLANNED INTERVENTIONS: Therapeutic exercises, Therapeutic activity, Neuromuscular re-education, Balance training, Gait training, Patient/Family education, Self Care, Joint mobilization, Vestibular training, Canalith repositioning, and Manual therapy  PLAN FOR NEXT SESSION: walking with head turns, walking beam, tasks on airex    [employer], PT 01/22/2022, 11:55 AM

## 2022-01-22 ENCOUNTER — Ambulatory Visit: Payer: Medicare Other

## 2022-01-22 DIAGNOSIS — R2689 Other abnormalities of gait and mobility: Secondary | ICD-10-CM | POA: Diagnosis not present

## 2022-01-22 DIAGNOSIS — R278 Other lack of coordination: Secondary | ICD-10-CM | POA: Diagnosis not present

## 2022-01-22 DIAGNOSIS — R42 Dizziness and giddiness: Secondary | ICD-10-CM

## 2022-01-24 DIAGNOSIS — H02403 Unspecified ptosis of bilateral eyelids: Secondary | ICD-10-CM | POA: Diagnosis not present

## 2022-01-24 DIAGNOSIS — H524 Presbyopia: Secondary | ICD-10-CM | POA: Diagnosis not present

## 2022-01-28 ENCOUNTER — Telehealth: Payer: Self-pay | Admitting: Pharmacist

## 2022-01-28 NOTE — Progress Notes (Signed)
Chronic Care Management Pharmacy Assistant   Name: Shelby Reeves  MRN: [id number] DOB: 1945-09-25   Reason for Encounter: Disease State - Hypertension Call    Recent office visits:  01/14/22 Annual Wellness Exam Completed   01/10/22 Annye Asa, MD - Family Medicine - Hypertension - Labs were ordered. Follow up in 6 months for follow up.   12/23/21 Annye Asa, MD - Family Medicine - Vertigo - Referral to Neuro rehab placed. meclizine (ANTIVERT) 25 MG tablet and tiZANidine (ZANAFLEX) 4 MG tablet prescribed. Follow up as needed.   Recent consult visits:  09/30/21 Pietro Cassis PA-C - ENT - Otitis externa of right ear - Right ear debrided in office. Continue clotrimazole drops twice daily for another 7 to 10 days then may stop. Follow up as needed.   Hospital visits:  None in previous 6 months  Medications: Outpatient Encounter Medications as of 01/28/2022  Medication Sig   aspirin 81 MG tablet Take 81 mg by mouth daily.   clotrimazole (LOTRIMIN) 1 % external solution 3 drops in R ear twice daily x14 days   hydrochlorothiazide (HYDRODIURIL) 12.5 MG tablet TAKE 1 TO 2 TABLETS BY MOUTH EVERY DAY DEPENDING ON SWELLING   losartan (COZAAR) 50 MG tablet Take 2 tablets (100 mg total) by mouth daily.   meclizine (ANTIVERT) 25 MG tablet Take 1 tablet (25 mg total) by mouth 3 (three) times daily as needed for dizziness.   Multiple Vitamin (MULTIVITAMIN) tablet Take 1 tablet by mouth daily.   Multiple Vitamins-Minerals (HAIR SKIN AND NAILS FORMULA PO) Take by mouth.   pantoprazole (PROTONIX) 40 MG tablet Take 1 tablet (40 mg total) by mouth daily.   pravastatin (PRAVACHOL) 40 MG tablet Take one tablet daily   tiZANidine (ZANAFLEX) 4 MG tablet Take 1 tablet (4 mg total) by mouth every 8 (eight) hours as needed for muscle spasms.   [DISCONTINUED] lisinopril-hydrochlorothiazide (PRINZIDE,ZESTORETIC) 10-12.5 MG per tablet Take 1 tablet by mouth daily.   No facility-administered  encounter medications on file as of 01/28/2022.    Current antihypertensive regimen:  Losartan 100 mg once daily  HCTZ 12.5 mg - 1-2 tablets once daily  How often are you checking your Blood Pressure?  Patient reported checking blood pressures once daily  Current home BP readings: 130/84 (today)    What recent interventions/DTPs have been made by any provider to improve Blood Pressure control since last CPP Visit:  Patient denied any medication changes since last visit with CPP   Any recent hospitalizations or ED visits since last visit with CPP? Patient has not had any hospitalizations or ED visits since last visit with CPP    What diet changes have been made to improve Blood Pressure Control?  Patient reports limiting her salt intake in her diet.    What exercise is being done to improve your Blood Pressure Control?   Patient reports she remains active. She uses a stationary bike at home and does walk regularly.    Adherence Review: Is the patient currently on ACE/ARB medication? Yes Does the patient have >5 day gap between last estimated fill dates? No    Care Gaps  AWV: done 01/14/22 Colonoscopy: done 04/08/18 DM Eye Exam: N/A DM Foot Exam: N/A Microalbumin: N/A HbgAIC: N/A DEXA: done 09/03/21 Mammogram: done 09/04/21   Star Rating Drugs: Losartan (COZAAR) 50 MG tablet - last filled 11/21/21 90 days  Pravastatin (PRAVACHOL) 40 MG tablet - last filled 11/25/21 90 days     Future Appointments  Date Time Provider [place]  01/29/2022  2:00 PM Andris Baumann, [place]  02/05/2022  2:45 PM Ellwood Sayers [place], [place]  09/17/2022  2:00 PM [facility] CCM PHARMACIST [facility] [place], [facility] Clinical Pharmacist Assistant  [phone]

## 2022-01-28 NOTE — Therapy (Signed)
OUTPATIENT PHYSICAL THERAPY VESTIBULAR TREATMENT     Patient Name: Shelby Reeves MRN: [id number] DOB:10-04-1945, 76 y.o., female Today's Date: 01/29/2022  PCP: Annye Asa  REFERRING PROVIDER: Annye Asa   PT End of Session - 01/29/22 1355     Visit Number 3    Date for PT Re-Evaluation 03/18/22    Authorization Type [employer] Medicare    PT Start Time 1355    PT Stop Time 1440    PT Time Calculation (min) 45 min    Activity Tolerance Patient tolerated treatment well    Behavior During Therapy [facility] for tasks assessed/performed              Past Medical History:  Diagnosis Date   Allergy    Carpal tunnel syndrome    Fibroid    GERD (gastroesophageal reflux disease)    History of colon polyps 2002   Hyperlipidemia    Hypertension    Vertigo    Past Surgical History:  Procedure Laterality Date   BIOPSY THYROID     2018   CARPAL TUNNEL RELEASE     bilateral   CATARACT EXTRACTION W/ INTRAOCULAR LENS  IMPLANT, BILATERAL Bilateral 2018   July and August   COLONOSCOPY     hx tubular adenomas 2003   ENDOMETRIAL BIOPSY  09-12-09   --benign/Dr. Romine   HYSTEROSCOPY  10-22-09   resection of polyps and D & C-Dr. Joan Flores   HYSTEROSCOPY  06-17-91   and D & C   KNEE ARTHROSCOPY  2010   right   POLYPECTOMY     UTERINE FIBROID SURGERY     Patient Active Problem List   Diagnosis Date Noted   Cyst of finger 10/03/2015   Severe obesity (BMI >= 40) ([place]) 12/07/2013   Ventral hernia 02/25/2013   Paresthesia of left arm 09/23/2012   Generalized anxiety disorder 09/23/2012   General medical examination 02/12/2011   Back pain 08/27/2010   ORTHOSTATIC HYPOTENSION 06/28/2010   VERTIGO 06/28/2010   Hyperlipidemia 05/06/2010   Essential hypertension 05/06/2010   RHINITIS 05/06/2010    ONSET DATE: 12/23/21  REFERRING DIAG: R42  THERAPY DIAG:  Other abnormalities of gait and mobility  Dizziness and giddiness  Other lack of  coordination  Cervicalgia  Rationale for Evaluation and Treatment Rehabilitation  SUBJECTIVE:   SUBJECTIVE STATEMENT: I am good, that dry needling was wonderful, having some pain in my neck but I think its an artery. My blood pressure dropped yesterday when I was getting up and I felt terrible and dizzy but then after a few hours it got better and I went to my ceramics class.   Pt accompanied by: self  PERTINENT HISTORY: HTN, vertigo, bilateral carpal tunnel release   PAIN:  Are you having pain? No  PRECAUTIONS: None and Fall  WEIGHT BEARING RESTRICTIONS No  FALLS: Has patient fallen in last 6 months? No  LIVING ENVIRONMENT: Lives with: lives alone Lives in: House/apartment Stairs: No Has following equipment at home: None  PLOF: Independent  PATIENT GOALS be able to walk straight without going off to the side   OBJECTIVE:   COGNITION: Overall cognitive status: Within functional limits for tasks assessed   SENSATION: WFL  POSTURE: rounded shoulders, forward head, and increased lumbar lordosis   Cervical ROM:  WFL   LOWER EXTREMITY MMT: grossly 5/5  GAIT: Gait pattern:  deviates from walking in straight line sometimes, trunk flexed, and wide BOS Assistive device utilized: None Level of assistance: Complete Independence  PATIENT  SURVEYS:  FOTO 52   VESTIBULAR ASSESSMENT    SYMPTOM BEHAVIOR:   Type of dizziness: Imbalance (Disequilibrium) and Unsteady with head/body turns   Frequency: 1-2x a day    Duration: 30s-1 min I sit down and it goes away   Aggravating factors: Induced by position change: supine to sit and if I move in any direction to quick    Relieving factors: rest   Progression of symptoms: better   OCULOMOTOR EXAM:   Ocular Alignment: normal   Ocular ROM: No Limitations   Spontaneous Nystagmus: absent   Gaze-Induced Nystagmus: absent   Smooth Pursuits: intact   Saccades: intact   Convergence/Divergence: 1-2 in    VESTIBULAR - OCULAR  REFLEX:    Slow VOR: Normal   VOR Cancellation: Normal   Dynamic Visual Acuity: Dynamic: normal    POSITIONAL TESTING: Right Dix-Hallpike: no nystagmus and slight dizziness but goes away quick   MOTION SENSITIVITY:    Motion Sensitivity Quotient  Intensity: 0 = none, 1 = Lightheaded, 2 = Mild, 3 = Moderate, 4 = Severe, 5 = Vomiting  Intensity  1. Sitting to supine 3  2. Supine to L side 2  3. Supine to R side 3  4. Supine to sitting 3  5. L Hallpike-Dix   6. Up from L    7. R Hallpike-Dix 3  8. Up from R  3  9. Sitting, head  tipped to L knee 0  10. Head up from L  knee 1  11. Sitting, head  tipped to R knee 0  12. Head up from R  knee 1  13. Sitting head turns x5 0  14.Sitting head nods x5 2  15. In stance, 180  turn to L  2  16. In stance, 180  turn to R 2    OTHOSTATICS: not done  FUNCTIONAL GAIT: Berg Balance Scale: 46/56  TODAY'S TREATMENT 01/29/22 NuStep L5 x53mns  BERG Step ups 6" Standing on airex feet together,  half tandem 30s  Side steps on airex  Catch on airex, then volleyball hits on airex  Walking and catching ball forwards, backwards, and side steps  Shoulder ext 10# 2x10  Cable rows 10# 2x10  STM to bilateral upper traps   Check orthostatics  - Supine 147/85 - Sitting 120/87 - Standing 132/90 UBE L2 x4 mins  UT and scalenes stretch 30s  Shoulder ext 5# 2x10  Step up on airex,cues to clear feet to prevent tripping over it  Walk on beam side steps and narrow  Walking with head turns     PATIENT EDUCATION: Education details: POC Person educated: Patient Education method: Explanation Education comprehension: verbalized understanding   GOALS: Goals reviewed with patient? Yes  SHORT TERM GOALS: Target date: 02/11/22  Patient will be independent with initial HEP. Goal status: INITIAL   LONG TERM GOALS: Target date: 03/18/22  Patient will be independent with advanced/ongoing HEP to improve outcomes and carryover.  Goal  status: INITIAL  2.  Patient will report no dizziness with positional changes. Baseline: dizzy with quick movements Goal status: IN PROGRESS  3.  Patient will score 54 on Berg Balance test to demonstrate lower risk of falls.   Baseline: 46, 53/56-10/4/23 Goal status: IN PROGRESS  4.  Patient will report 657on FOTO to demonstrate improved functional ability. Baseline: 52 Goal status: INITIAL  ASSESSMENT:  CLINICAL IMPRESSION: Patient enters doing better than last visit, stating the dry needling was wonderful. Has made good improvements with balance  as seen in her BERG score. We worked on balance today and challenged her in different ways. She reports no dizziness throughout session. Ended session with some neck and back strengthening.   REHAB POTENTIAL: Good  CLINICAL DECISION MAKING: Stable/uncomplicated  EVALUATION COMPLEXITY: Low   PLAN: PT FREQUENCY: 1x/week  PT DURATION: 10 weeks  PLANNED INTERVENTIONS: Therapeutic exercises, Therapeutic activity, Neuromuscular re-education, Balance training, Gait training, Patient/Family education, Self Care, Joint mobilization, Vestibular training, Canalith repositioning, and Manual therapy  PLAN FOR NEXT SESSION: walking with head turns, walking beam, tasks on [employer], PT 01/29/2022, 2:38 PM

## 2022-01-29 ENCOUNTER — Ambulatory Visit: Payer: Medicare Other | Attending: Family Medicine

## 2022-01-29 DIAGNOSIS — R2689 Other abnormalities of gait and mobility: Secondary | ICD-10-CM | POA: Diagnosis not present

## 2022-01-29 DIAGNOSIS — M542 Cervicalgia: Secondary | ICD-10-CM | POA: Diagnosis not present

## 2022-01-29 DIAGNOSIS — R42 Dizziness and giddiness: Secondary | ICD-10-CM | POA: Diagnosis not present

## 2022-01-29 DIAGNOSIS — R278 Other lack of coordination: Secondary | ICD-10-CM | POA: Diagnosis not present

## 2022-02-04 NOTE — Therapy (Signed)
OUTPATIENT PHYSICAL THERAPY VESTIBULAR TREATMENT     Patient Name: Shelby Reeves MRN: [id number] DOB:1945/08/09, 76 y.o., female Today's Date: 02/05/2022  PCP: Annye Asa  REFERRING PROVIDER: Annye Asa   PT End of Session - 02/05/22 1447     Visit Number 4    Date for PT Re-Evaluation 03/18/22    Authorization Type [employer] Medicare    PT Start Time 1445    Activity Tolerance Patient tolerated treatment well    Behavior During Therapy [facility] for tasks assessed/performed               Past Medical History:  Diagnosis Date   Allergy    Carpal tunnel syndrome    Fibroid    GERD (gastroesophageal reflux disease)    History of colon polyps 2002   Hyperlipidemia    Hypertension    Vertigo    Past Surgical History:  Procedure Laterality Date   BIOPSY THYROID     2018   CARPAL TUNNEL RELEASE     bilateral   CATARACT EXTRACTION W/ INTRAOCULAR LENS  IMPLANT, BILATERAL Bilateral 2018   July and August   COLONOSCOPY     hx tubular adenomas 2003   ENDOMETRIAL BIOPSY  09-12-09   --benign/Dr. Romine   HYSTEROSCOPY  10-22-09   resection of polyps and D & C-Dr. Joan Flores   HYSTEROSCOPY  06-17-91   and D & C   KNEE ARTHROSCOPY  2010   right   POLYPECTOMY     UTERINE FIBROID SURGERY     Patient Active Problem List   Diagnosis Date Noted   Cyst of finger 10/03/2015   Severe obesity (BMI >= 40) ([place]) 12/07/2013   Ventral hernia 02/25/2013   Paresthesia of left arm 09/23/2012   Generalized anxiety disorder 09/23/2012   General medical examination 02/12/2011   Back pain 08/27/2010   ORTHOSTATIC HYPOTENSION 06/28/2010   VERTIGO 06/28/2010   Hyperlipidemia 05/06/2010   Essential hypertension 05/06/2010   RHINITIS 05/06/2010    ONSET DATE: 12/23/21  REFERRING DIAG: R42  THERAPY DIAG:  Other abnormalities of gait and mobility  Other lack of coordination  Dizziness and giddiness  Cervicalgia  Rationale for Evaluation and Treatment  Rehabilitation  SUBJECTIVE:   SUBJECTIVE STATEMENT: I am good, that dry needling was wonderful, having some pain in my neck but I think its an artery. My blood pressure dropped yesterday when I was getting up and I felt terrible and dizzy but then after a few hours it got better and I went to my ceramics class.   Pt accompanied by: self  PERTINENT HISTORY: HTN, vertigo, bilateral carpal tunnel release   PAIN:  Are you having pain? No  PRECAUTIONS: None and Fall  WEIGHT BEARING RESTRICTIONS No  FALLS: Has patient fallen in last 6 months? No  LIVING ENVIRONMENT: Lives with: lives alone Lives in: House/apartment Stairs: No Has following equipment at home: None  PLOF: Independent  PATIENT GOALS be able to walk straight without going off to the side   OBJECTIVE:   COGNITION: Overall cognitive status: Within functional limits for tasks assessed   SENSATION: WFL  POSTURE: rounded shoulders, forward head, and increased lumbar lordosis   Cervical ROM:  WFL   LOWER EXTREMITY MMT: grossly 5/5  GAIT: Gait pattern:  deviates from walking in straight line sometimes, trunk flexed, and wide BOS Assistive device utilized: None Level of assistance: Complete Independence  PATIENT SURVEYS:  FOTO 52   VESTIBULAR ASSESSMENT    SYMPTOM BEHAVIOR:  Type of dizziness: Imbalance (Disequilibrium) and Unsteady with head/body turns   Frequency: 1-2x a day    Duration: 30s-1 min I sit down and it goes away   Aggravating factors: Induced by position change: supine to sit and if I move in any direction to quick    Relieving factors: rest   Progression of symptoms: better   OCULOMOTOR EXAM:   Ocular Alignment: normal   Ocular ROM: No Limitations   Spontaneous Nystagmus: absent   Gaze-Induced Nystagmus: absent   Smooth Pursuits: intact   Saccades: intact   Convergence/Divergence: 1-2 in    VESTIBULAR - OCULAR REFLEX:    Slow VOR: Normal   VOR Cancellation: Normal   Dynamic  Visual Acuity: Dynamic: normal    POSITIONAL TESTING: Right Dix-Hallpike: no nystagmus and slight dizziness but goes away quick   MOTION SENSITIVITY:    Motion Sensitivity Quotient  Intensity: 0 = none, 1 = Lightheaded, 2 = Mild, 3 = Moderate, 4 = Severe, 5 = Vomiting  Intensity  1. Sitting to supine 3  2. Supine to L side 2  3. Supine to R side 3  4. Supine to sitting 3  5. L Hallpike-Dix   6. Up from L    7. R Hallpike-Dix 3  8. Up from R  3  9. Sitting, head  tipped to L knee 0  10. Head up from L  knee 1  11. Sitting, head  tipped to R knee 0  12. Head up from R  knee 1  13. Sitting head turns x5 0  14.Sitting head nods x5 2  15. In stance, 180  turn to L  2  16. In stance, 180  turn to R 2    OTHOSTATICS: not done  FUNCTIONAL GAIT: Berg Balance Scale: 46/56  TODAY'S TREATMENT 02/05/22 UBE L3 x58mns  Rows and Lats 25# 2x10 Quick turns around cones  Standing on airex cone taps- SBA Standing on airex picking up cones  Airex to 6" step up  DN to R upper trap   01/29/22 NuStep L5 x673ms  BERG Step ups 6" Standing on airex feet together,  half tandem 30s  Side steps on airex  Catch on airex, then volleyball hits on airex  Walking and catching ball forwards, backwards, and side steps  Shoulder ext 10# 2x10  Cable rows 10# 2x10  STM to bilateral upper traps   Check orthostatics  - Supine 147/85 - Sitting 120/87 - Standing 132/90 UBE L2 x4 mins  UT and scalenes stretch 30s  Shoulder ext 5# 2x10  Step up on airex,cues to clear feet to prevent tripping over it  Walk on beam side steps and narrow  Walking with head turns     PATIENT EDUCATION: Education details: POC Person educated: Patient Education method: Explanation Education comprehension: verbalized understanding   GOALS: Goals reviewed with patient? Yes  SHORT TERM GOALS: Target date: 02/11/22  Patient will be independent with initial HEP. Goal status: INITIAL   LONG TERM  GOALS: Target date: 03/18/22  Patient will be independent with advanced/ongoing HEP to improve outcomes and carryover.  Goal status: INITIAL  2.  Patient will report no dizziness with positional changes. Baseline: dizzy with quick movements Goal status: IN PROGRESS  3.  Patient will score 54 on Berg Balance test to demonstrate lower risk of falls.   Baseline: 46, 53/56-10/4/23 Goal status: IN PROGRESS  4.  Patient will report 6390n FOTO to demonstrate improved functional ability. Baseline: 52 Goal  status: INITIAL  ASSESSMENT:  CLINICAL IMPRESSION: Patient doing well overall, not having much neck pain or dizzy spells. Her dizziness comes on randomly but it is not enough to make her lose her balance. She reports no dizziness throughout session. Has trigger points in her R upper trap that was treated today with DN.    REHAB POTENTIAL: Good  CLINICAL DECISION MAKING: Stable/uncomplicated  EVALUATION COMPLEXITY: Low   PLAN: PT FREQUENCY: 1x/week  PT DURATION: 10 weeks  PLANNED INTERVENTIONS: Therapeutic exercises, Therapeutic activity, Neuromuscular re-education, Balance training, Gait training, Patient/Family education, Self Care, Joint mobilization, Vestibular training, Canalith repositioning, and Manual therapy  PLAN FOR NEXT SESSION: walking with head turns, walking beam, tasks on airex    [employer], PT 02/05/2022, 3:31 PM

## 2022-02-05 ENCOUNTER — Ambulatory Visit: Payer: Medicare Other

## 2022-02-05 DIAGNOSIS — R2689 Other abnormalities of gait and mobility: Secondary | ICD-10-CM | POA: Diagnosis not present

## 2022-02-05 DIAGNOSIS — R278 Other lack of coordination: Secondary | ICD-10-CM

## 2022-02-05 DIAGNOSIS — R42 Dizziness and giddiness: Secondary | ICD-10-CM | POA: Diagnosis not present

## 2022-02-05 DIAGNOSIS — M542 Cervicalgia: Secondary | ICD-10-CM | POA: Diagnosis not present

## 2022-02-10 ENCOUNTER — Ambulatory Visit: Payer: Medicare Other | Admitting: Podiatry

## 2022-02-10 DIAGNOSIS — L600 Ingrowing nail: Secondary | ICD-10-CM | POA: Diagnosis not present

## 2022-02-10 DIAGNOSIS — B351 Tinea unguium: Secondary | ICD-10-CM | POA: Diagnosis not present

## 2022-02-10 MED ORDER — CEPHALEXIN 500 MG PO CAPS: 500.0000 mg | ORAL_CAPSULE | Freq: Three times a day (TID) | ORAL | 0 refills | Status: DC

## 2022-02-10 NOTE — Progress Notes (Unsigned)
Subjective: Chief Complaint  Patient presents with   Nail Problem    Nail fungus, left hallux and 5th, want to talk about nail removal, hallux is sore,  thick and yellow in color     The big toenail has come back. Occasional tenderness.  Denies any systemic complaints such as fevers, chills, nausea, vomiting. No acute changes since last appointment, and no other complaints at this time.   Objective: AAO x3, NAD DP/PT pulses palpable bilaterally, CRT less than 3 seconds Protective sensation intact with Simms Weinstein monofilament, vibratory sensation intact, Achilles tendon reflex intact No areas of pinpoint bony tenderness or pain with vibratory sensation. MMT 5/5, ROM WNL. No edema, erythema, increase in warmth to bilateral lower extremities.  No open lesions or pre-ulcerative lesions.  No pain with calf compression, swelling, warmth, erythema  Assessment:  Plan: -All treatment options discussed with the patient including all alternatives, risks, complications.   -Patient encouraged to call the office with any questions, concerns, change in symptoms.

## 2022-02-10 NOTE — Patient Instructions (Addendum)
Place 1/4 cup of epsom salts in a quart of warm tap water.  Submerge your foot or feet in the solution and soak for 20 minutes.  This soak should be done twice a day.  Next, remove your foot or feet from solution, blot dry the affected area. Apply ointment and cover if instructed by your doctor.   IF YOUR SKIN BECOMES IRRITATED WHILE USING THESE INSTRUCTIONS, IT IS OKAY TO SWITCH TO  WHITE VINEGAR AND WATER.  As another alternative soak, you may use antibacterial soap and water.  Monitor for any signs/symptoms of infection. Call the office immediately if any occur or go directly to the emergency room. Call with any questions/concerns.   Plantar Fasciitis (Heel Spur Syndrome) with Rehab The plantar fascia is a fibrous, ligament-like, soft-tissue structure that spans the bottom of the foot. Plantar fasciitis is a condition that causes pain in the foot due to inflammation of the tissue. SYMPTOMS  Pain and tenderness on the underneath side of the foot. Pain that worsens with standing or walking. CAUSES  Plantar fasciitis is caused by irritation and injury to the plantar fascia on the underneath side of the foot. Common mechanisms of injury include: Direct trauma to bottom of the foot. Damage to a small nerve that runs under the foot where the main fascia attaches to the heel bone. Stress placed on the plantar fascia due to bone spurs. RISK INCREASES WITH:  Activities that place stress on the plantar fascia (running, jumping, pivoting, or cutting). Poor strength and flexibility. Improperly fitted shoes. Tight calf muscles. Flat feet. Failure to warm-up properly before activity. Obesity. PREVENTION Warm up and stretch properly before activity. Allow for adequate recovery between workouts. Maintain physical fitness: Strength, flexibility, and endurance. Cardiovascular fitness. Maintain a health body weight. Avoid stress on the plantar fascia. Wear properly fitted shoes, including arch  supports for individuals who have flat feet.  PROGNOSIS  If treated properly, then the symptoms of plantar fasciitis usually resolve without surgery. However, occasionally surgery is necessary.  RELATED COMPLICATIONS  Recurrent symptoms that may result in a chronic condition. Problems of the lower back that are caused by compensating for the injury, such as limping. Pain or weakness of the foot during push-off following surgery. Chronic inflammation, scarring, and partial or complete fascia tear, occurring more often from repeated injections.  TREATMENT  Treatment initially involves the use of ice and medication to help reduce pain and inflammation. The use of strengthening and stretching exercises may help reduce pain with activity, especially stretches of the Achilles tendon. These exercises may be performed at home or with a therapist. Your caregiver may recommend that you use heel cups of arch supports to help reduce stress on the plantar fascia. Occasionally, corticosteroid injections are given to reduce inflammation. If symptoms persist for greater than 6 months despite non-surgical (conservative), then surgery may be recommended.   MEDICATION  If pain medication is necessary, then nonsteroidal anti-inflammatory medications, such as aspirin and ibuprofen, or other minor pain relievers, such as acetaminophen, are often recommended. Do not take pain medication within 7 days before surgery. Prescription pain relievers may be given if deemed necessary by your caregiver. Use only as directed and only as much as you need. Corticosteroid injections may be given by your caregiver. These injections should be reserved for the most serious cases, because they may only be given a certain number of times.  HEAT AND COLD Cold treatment (icing) relieves pain and reduces inflammation. Cold treatment should be applied   for 10 to 15 minutes every 2 to 3 hours for inflammation and pain and immediately after  any activity that aggravates your symptoms. Use ice packs or massage the area with a piece of ice (ice massage). Heat treatment may be used prior to performing the stretching and strengthening activities prescribed by your caregiver, physical therapist, or athletic trainer. Use a heat pack or soak the injury in warm water.  SEEK IMMEDIATE MEDICAL CARE IF: Treatment seems to offer no benefit, or the condition worsens. Any medications produce adverse side effects.  EXERCISES- RANGE OF MOTION (ROM) AND STRETCHING EXERCISES - Plantar Fasciitis (Heel Spur Syndrome) These exercises may help you when beginning to rehabilitate your injury. Your symptoms may resolve with or without further involvement from your physician, physical therapist or athletic trainer. While completing these exercises, remember:  Restoring tissue flexibility helps normal motion to return to the joints. This allows healthier, less painful movement and activity. An effective stretch should be held for at least 30 seconds. A stretch should never be painful. You should only feel a gentle lengthening or release in the stretched tissue.  RANGE OF MOTION - Toe Extension, Flexion Sit with your right / left leg crossed over your opposite knee. Grasp your toes and gently pull them back toward the top of your foot. You should feel a stretch on the bottom of your toes and/or foot. Hold this stretch for 10 seconds. Now, gently pull your toes toward the bottom of your foot. You should feel a stretch on the top of your toes and or foot. Hold this stretch for 10 seconds. Repeat  times. Complete this stretch 3 times per day.   RANGE OF MOTION - Ankle Dorsiflexion, Active Assisted Remove shoes and sit on a chair that is preferably not on a carpeted surface. Place right / left foot under knee. Extend your opposite leg for support. Keeping your heel down, slide your right / left foot back toward the chair until you feel a stretch at your ankle  or calf. If you do not feel a stretch, slide your bottom forward to the edge of the chair, while still keeping your heel down. Hold this stretch for 10 seconds. Repeat 3 times. Complete this stretch 2 times per day.   STRETCH  Gastroc, Standing Place hands on wall. Extend right / left leg, keeping the front knee somewhat bent. Slightly point your toes inward on your back foot. Keeping your right / left heel on the floor and your knee straight, shift your weight toward the wall, not allowing your back to arch. You should feel a gentle stretch in the right / left calf. Hold this position for 10 seconds. Repeat 3 times. Complete this stretch 2 times per day.  STRETCH  Soleus, Standing Place hands on wall. Extend right / left leg, keeping the other knee somewhat bent. Slightly point your toes inward on your back foot. Keep your right / left heel on the floor, bend your back knee, and slightly shift your weight over the back leg so that you feel a gentle stretch deep in your back calf. Hold this position for 10 seconds. Repeat 3 times. Complete this stretch 2 times per day.  STRETCH  Gastrocsoleus, Standing  Note: This exercise can place a lot of stress on your foot and ankle. Please complete this exercise only if specifically instructed by your caregiver.  Place the ball of your right / left foot on a step, keeping your other foot firmly on the   same step. Hold on to the wall or a rail for balance. Slowly lift your other foot, allowing your body weight to press your heel down over the edge of the step. You should feel a stretch in your right / left calf. Hold this position for 10 seconds. Repeat this exercise with a slight bend in your right / left knee. Repeat 3 times. Complete this stretch 2 times per day.   STRENGTHENING EXERCISES - Plantar Fasciitis (Heel Spur Syndrome)  These exercises may help you when beginning to rehabilitate your injury. They may resolve your symptoms with or  without further involvement from your physician, physical therapist or athletic trainer. While completing these exercises, remember:  Muscles can gain both the endurance and the strength needed for everyday activities through controlled exercises. Complete these exercises as instructed by your physician, physical therapist or athletic trainer. Progress the resistance and repetitions only as guided.  STRENGTH - Towel Curls Sit in a chair positioned on a non-carpeted surface. Place your foot on a towel, keeping your heel on the floor. Pull the towel toward your heel by only curling your toes. Keep your heel on the floor. Repeat 3 times. Complete this exercise 2 times per day.  STRENGTH - Ankle Inversion Secure one end of a rubber exercise band/tubing to a fixed object (table, pole). Loop the other end around your foot just before your toes. Place your fists between your knees. This will focus your strengthening at your ankle. Slowly, pull your big toe up and in, making sure the band/tubing is positioned to resist the entire motion. Hold this position for 10 seconds. Have your muscles resist the band/tubing as it slowly pulls your foot back to the starting position. Repeat 3 times. Complete this exercises 2 times per day.  Document Released: 04/14/2005 Document Revised: 07/07/2011 Document Reviewed: 07/27/2008 [employer] Patient Information 2014 [employer].  

## 2022-02-13 NOTE — Therapy (Incomplete)
OUTPATIENT PHYSICAL THERAPY VESTIBULAR TREATMENT     Patient Name: Shelby Reeves MRN: [id number] DOB:07-21-1945, 76 y.o., female Today's Date: 02/13/2022  PCP: Annye Asa  REFERRING PROVIDER: Annye Asa       Past Medical History:  Diagnosis Date   Allergy    Carpal tunnel syndrome    Fibroid    GERD (gastroesophageal reflux disease)    History of colon polyps 2002   Hyperlipidemia    Hypertension    Vertigo    Past Surgical History:  Procedure Laterality Date   BIOPSY THYROID     2018   CARPAL TUNNEL RELEASE     bilateral   CATARACT EXTRACTION W/ INTRAOCULAR LENS  IMPLANT, BILATERAL Bilateral 2018   July and August   COLONOSCOPY     hx tubular adenomas 2003   ENDOMETRIAL BIOPSY  09-12-09   --benign/Dr. Romine   HYSTEROSCOPY  10-22-09   resection of polyps and D & C-Dr. Joan Flores   HYSTEROSCOPY  06-17-91   and D & C   KNEE ARTHROSCOPY  2010   right   POLYPECTOMY     UTERINE FIBROID SURGERY     Patient Active Problem List   Diagnosis Date Noted   Cyst of finger 10/03/2015   Severe obesity (BMI >= 40) ([place]) 12/07/2013   Ventral hernia 02/25/2013   Paresthesia of left arm 09/23/2012   Generalized anxiety disorder 09/23/2012   General medical examination 02/12/2011   Back pain 08/27/2010   ORTHOSTATIC HYPOTENSION 06/28/2010   VERTIGO 06/28/2010   Hyperlipidemia 05/06/2010   Essential hypertension 05/06/2010   RHINITIS 05/06/2010    ONSET DATE: 12/23/21  REFERRING DIAG: R42  THERAPY DIAG:  No diagnosis found.  Rationale for Evaluation and Treatment Rehabilitation  SUBJECTIVE:   SUBJECTIVE STATEMENT: I am good, that dry needling was wonderful, having some pain in my neck but I think its an artery. My blood pressure dropped yesterday when I was getting up and I felt terrible and dizzy but then after a few hours it got better and I went to my ceramics class.   Pt accompanied by: self  PERTINENT HISTORY: HTN, vertigo, bilateral carpal  tunnel release   PAIN:  Are you having pain? No  PRECAUTIONS: None and Fall  WEIGHT BEARING RESTRICTIONS No  FALLS: Has patient fallen in last 6 months? No  LIVING ENVIRONMENT: Lives with: lives alone Lives in: House/apartment Stairs: No Has following equipment at home: None  PLOF: Independent  PATIENT GOALS be able to walk straight without going off to the side   OBJECTIVE:   COGNITION: Overall cognitive status: Within functional limits for tasks assessed   SENSATION: WFL  POSTURE: rounded shoulders, forward head, and increased lumbar lordosis   Cervical ROM:  WFL   LOWER EXTREMITY MMT: grossly 5/5  GAIT: Gait pattern:  deviates from walking in straight line sometimes, trunk flexed, and wide BOS Assistive device utilized: None Level of assistance: Complete Independence  PATIENT SURVEYS:  FOTO 52   VESTIBULAR ASSESSMENT    SYMPTOM BEHAVIOR:   Type of dizziness: Imbalance (Disequilibrium) and Unsteady with head/body turns   Frequency: 1-2x a day    Duration: 30s-1 min I sit down and it goes away   Aggravating factors: Induced by position change: supine to sit and if I move in any direction to quick    Relieving factors: rest   Progression of symptoms: better   OCULOMOTOR EXAM:   Ocular Alignment: normal   Ocular ROM: No Limitations   Spontaneous  Nystagmus: absent   Gaze-Induced Nystagmus: absent   Smooth Pursuits: intact   Saccades: intact   Convergence/Divergence: 1-2 in    VESTIBULAR - OCULAR REFLEX:    Slow VOR: Normal   VOR Cancellation: Normal   Dynamic Visual Acuity: Dynamic: normal    POSITIONAL TESTING: Right Dix-Hallpike: no nystagmus and slight dizziness but goes away quick   MOTION SENSITIVITY:    Motion Sensitivity Quotient  Intensity: 0 = none, 1 = Lightheaded, 2 = Mild, 3 = Moderate, 4 = Severe, 5 = Vomiting  Intensity  1. Sitting to supine 3  2. Supine to L side 2  3. Supine to R side 3  4. Supine to sitting 3  5. L  Hallpike-Dix   6. Up from L    7. R Hallpike-Dix 3  8. Up from R  3  9. Sitting, head  tipped to L knee 0  10. Head up from L  knee 1  11. Sitting, head  tipped to R knee 0  12. Head up from R  knee 1  13. Sitting head turns x5 0  14.Sitting head nods x5 2  15. In stance, 180  turn to L  2  16. In stance, 180  turn to R 2    OTHOSTATICS: not done  FUNCTIONAL GAIT: Berg Balance Scale: 46/56  TODAY'S TREATMENT 02/17/22 NuStep FOTO Walking on beam Standing on airex hitting ball  Rows and Lats   02/05/22 UBE L3 x64mns  Rows and Lats 25# 2x10 Quick turns around cones  Standing on airex cone taps- SBA Standing on airex picking up cones  Airex to 6" step up  DN to R upper trap   01/29/22 NuStep L5 x626ms  BERG Step ups 6" Standing on airex feet together,  half tandem 30s  Side steps on airex  Catch on airex, then volleyball hits on airex  Walking and catching ball forwards, backwards, and side steps  Shoulder ext 10# 2x10  Cable rows 10# 2x10  STM to bilateral upper traps   Check orthostatics  - Supine 147/85 - Sitting 120/87 - Standing 132/90 UBE L2 x4 mins  UT and scalenes stretch 30s  Shoulder ext 5# 2x10  Step up on airex,cues to clear feet to prevent tripping over it  Walk on beam side steps and narrow  Walking with head turns     PATIENT EDUCATION: Education details: POC Person educated: Patient Education method: Explanation Education comprehension: verbalized understanding   GOALS: Goals reviewed with patient? Yes  SHORT TERM GOALS: Target date: 02/11/22  Patient will be independent with initial HEP. Goal status: INITIAL   LONG TERM GOALS: Target date: 03/18/22  Patient will be independent with advanced/ongoing HEP to improve outcomes and carryover.  Goal status: INITIAL  2.  Patient will report no dizziness with positional changes. Baseline: dizzy with quick movements Goal status: IN PROGRESS  3.  Patient will score 54 on  Berg Balance test to demonstrate lower risk of falls.   Baseline: 46, 53/56-10/4/23 Goal status: IN PROGRESS  4.  Patient will report 6328n FOTO to demonstrate improved functional ability. Baseline: 52 Goal status: INITIAL  ASSESSMENT:  CLINICAL IMPRESSION: Patient doing well overall, not having much neck pain or dizzy spells. Her dizziness comes on randomly but it is not enough to make her lose her balance. She reports no dizziness throughout session. Has trigger points in her R upper trap that was treated today with DN.    REHAB POTENTIAL: Good  CLINICAL DECISION MAKING: Stable/uncomplicated  EVALUATION COMPLEXITY: Low   PLAN: PT FREQUENCY: 1x/week  PT DURATION: 10 weeks  PLANNED INTERVENTIONS: Therapeutic exercises, Therapeutic activity, Neuromuscular re-education, Balance training, Gait training, Patient/Family education, Self Care, Joint mobilization, Vestibular training, Canalith repositioning, and Manual therapy  PLAN FOR NEXT SESSION: walking with head turns, walking beam, tasks on airex    [employer], PT 02/13/2022, 4:44 PM

## 2022-02-17 ENCOUNTER — Ambulatory Visit: Payer: Medicare Other

## 2022-02-20 ENCOUNTER — Other Ambulatory Visit: Payer: Self-pay

## 2022-02-20 DIAGNOSIS — I10 Essential (primary) hypertension: Secondary | ICD-10-CM

## 2022-02-20 MED ORDER — LOSARTAN POTASSIUM 50 MG PO TABS: 100.0000 mg | ORAL_TABLET | Freq: Every day | ORAL | 1 refills | Status: DC

## 2022-02-25 NOTE — Therapy (Signed)
OUTPATIENT PHYSICAL THERAPY VESTIBULAR TREATMENT     Patient Name: Shelby Reeves MRN: [id number] DOB:Feb 01, 1946, 76 y.o., female Today's Date: 02/26/2022  PCP: Annye Asa  REFERRING PROVIDER: Annye Asa   PT End of Session - 02/26/22 1436     Visit Number 5    Date for PT Re-Evaluation 03/18/22    Authorization Type [employer] Medicare    PT Start Time 1436    PT Stop Time 1520    PT Time Calculation (min) 44 min    Activity Tolerance Patient tolerated treatment well    Behavior During Therapy [facility] for tasks assessed/performed                Past Medical History:  Diagnosis Date   Allergy    Carpal tunnel syndrome    Fibroid    GERD (gastroesophageal reflux disease)    History of colon polyps 2002   Hyperlipidemia    Hypertension    Vertigo    Past Surgical History:  Procedure Laterality Date   BIOPSY THYROID     2018   CARPAL TUNNEL RELEASE     bilateral   CATARACT EXTRACTION W/ INTRAOCULAR LENS  IMPLANT, BILATERAL Bilateral 2018   July and August   COLONOSCOPY     hx tubular adenomas 2003   ENDOMETRIAL BIOPSY  09-12-09   --benign/Dr. Romine   HYSTEROSCOPY  10-22-09   resection of polyps and D & C-Dr. Joan Flores   HYSTEROSCOPY  06-17-91   and D & C   KNEE ARTHROSCOPY  2010   right   POLYPECTOMY     UTERINE FIBROID SURGERY     Patient Active Problem List   Diagnosis Date Noted   Cyst of finger 10/03/2015   Severe obesity (BMI >= 40) ([place]) 12/07/2013   Ventral hernia 02/25/2013   Paresthesia of left arm 09/23/2012   Generalized anxiety disorder 09/23/2012   General medical examination 02/12/2011   Back pain 08/27/2010   ORTHOSTATIC HYPOTENSION 06/28/2010   VERTIGO 06/28/2010   Hyperlipidemia 05/06/2010   Essential hypertension 05/06/2010   RHINITIS 05/06/2010    ONSET DATE: 12/23/21  REFERRING DIAG: R42  THERAPY DIAG:  Dizziness and giddiness  Cervicalgia  Other lack of coordination  Other abnormalities of gait and  mobility  Rationale for Evaluation and Treatment Rehabilitation  SUBJECTIVE:   SUBJECTIVE STATEMENT: I am good, not having as much dizziness anymore only sometimes when I get up from bed.   Pt accompanied by: self  PERTINENT HISTORY: HTN, vertigo, bilateral carpal tunnel release   PAIN:  Are you having pain? No  PRECAUTIONS: None and Fall  WEIGHT BEARING RESTRICTIONS No  FALLS: Has patient fallen in last 6 months? No  LIVING ENVIRONMENT: Lives with: lives alone Lives in: House/apartment Stairs: No Has following equipment at home: None  PLOF: Independent  PATIENT GOALS be able to walk straight without going off to the side   OBJECTIVE:   COGNITION: Overall cognitive status: Within functional limits for tasks assessed   SENSATION: WFL  POSTURE: rounded shoulders, forward head, and increased lumbar lordosis   Cervical ROM:  WFL   LOWER EXTREMITY MMT: grossly 5/5  GAIT: Gait pattern:  deviates from walking in straight line sometimes, trunk flexed, and wide BOS Assistive device utilized: None Level of assistance: Complete Independence  PATIENT SURVEYS:  FOTO 52   VESTIBULAR ASSESSMENT    SYMPTOM BEHAVIOR:   Type of dizziness: Imbalance (Disequilibrium) and Unsteady with head/body turns   Frequency: 1-2x a day  Duration: 30s-1 min I sit down and it goes away   Aggravating factors: Induced by position change: supine to sit and if I move in any direction to quick    Relieving factors: rest   Progression of symptoms: better   OCULOMOTOR EXAM:   Ocular Alignment: normal   Ocular ROM: No Limitations   Spontaneous Nystagmus: absent   Gaze-Induced Nystagmus: absent   Smooth Pursuits: intact   Saccades: intact   Convergence/Divergence: 1-2 in    VESTIBULAR - OCULAR REFLEX:    Slow VOR: Normal   VOR Cancellation: Normal   Dynamic Visual Acuity: Dynamic: normal    POSITIONAL TESTING: Right Dix-Hallpike: no nystagmus and slight dizziness but goes away  quick   MOTION SENSITIVITY:    Motion Sensitivity Quotient  Intensity: 0 = none, 1 = Lightheaded, 2 = Mild, 3 = Moderate, 4 = Severe, 5 = Vomiting  Intensity  1. Sitting to supine 3  2. Supine to L side 2  3. Supine to R side 3  4. Supine to sitting 3  5. L Hallpike-Dix   6. Up from L    7. R Hallpike-Dix 3  8. Up from R  3  9. Sitting, head  tipped to L knee 0  10. Head up from L  knee 1  11. Sitting, head  tipped to R knee 0  12. Head up from R  knee 1  13. Sitting head turns x5 0  14.Sitting head nods x5 2  15. In stance, 180  turn to L  2  16. In stance, 180  turn to R 2    OTHOSTATICS: not done  FUNCTIONAL GAIT: Berg Balance Scale: 46/56  TODAY'S TREATMENT 02/27/22 NuStep L5 x1mns Walking on beam Standing on airex catching ball  Standing on airex hitting ball  Rows and Lats 25# 2x10 Shoulder ext 10# 2x10 Resisted gait 30# 4 way x3 each way  Walking and hitting ball    02/05/22 UBE L3 x673ms  Rows and Lats 25# 2x10 Quick turns around cones  Standing on airex cone taps- SBA Standing on airex picking up cones  Airex to 6" step up  DN to R upper trap   01/29/22 NuStep L5 x6m45m  BERG Step ups 6" Standing on airex feet together,  half tandem 30s  Side steps on airex  Catch on airex, then volleyball hits on airex  Walking and catching ball forwards, backwards, and side steps  Shoulder ext 10# 2x10  Cable rows 10# 2x10  STM to bilateral upper traps   Check orthostatics  - Supine 147/85 - Sitting 120/87 - Standing 132/90 UBE L2 x4 mins  UT and scalenes stretch 30s  Shoulder ext 5# 2x10  Step up on airex,cues to clear feet to prevent tripping over it  Walk on beam side steps and narrow  Walking with head turns     PATIENT EDUCATION: Education details: POC Person educated: Patient Education method: Explanation Education comprehension: verbalized understanding   GOALS: Goals reviewed with patient? Yes  SHORT TERM GOALS: Target  date: 02/11/22  Patient will be independent with initial HEP. Goal status: MET   LONG TERM GOALS: Target date: 03/18/22  Patient will be independent with advanced/ongoing HEP to improve outcomes and carryover.  Goal status: INITIAL  2.  Patient will report no dizziness with positional changes. Baseline: dizzy with quick movements,  Goal status: IN PROGRESS  3.  Patient will score 54 on Berg Balance test to demonstrate lower risk of falls.  Baseline: 46, 53/56-10/4/23 Goal status: IN PROGRESS  4.  Patient will report 3 on FOTO to demonstrate improved functional ability. Baseline: 52 Goal status: INITIAL  ASSESSMENT:  CLINICAL IMPRESSION: Patient doing well overall, not having much neck pain or dizzy spells. Her dizziness comes on randomly but the intensity has significantly improved. Does well with all balance and coordination tasks today, some difficulty with multitasking with walking and hitting ball.    REHAB POTENTIAL: Good  CLINICAL DECISION MAKING: Stable/uncomplicated  EVALUATION COMPLEXITY: Low   PLAN: PT FREQUENCY: 1x/week  PT DURATION: 10 weeks  PLANNED INTERVENTIONS: Therapeutic exercises, Therapeutic activity, Neuromuscular re-education, Balance training, Gait training, Patient/Family education, Self Care, Joint mobilization, Vestibular training, Canalith repositioning, and Manual therapy  PLAN FOR NEXT SESSION: walking with head turns, walking beam, tasks on airex    [employer], PT 02/26/2022, 3:19 PM

## 2022-02-26 ENCOUNTER — Ambulatory Visit: Payer: Medicare Other | Attending: Family Medicine

## 2022-02-26 DIAGNOSIS — R42 Dizziness and giddiness: Secondary | ICD-10-CM | POA: Insufficient documentation

## 2022-02-26 DIAGNOSIS — R278 Other lack of coordination: Secondary | ICD-10-CM | POA: Diagnosis not present

## 2022-02-26 DIAGNOSIS — M542 Cervicalgia: Secondary | ICD-10-CM | POA: Diagnosis not present

## 2022-02-26 DIAGNOSIS — R2689 Other abnormalities of gait and mobility: Secondary | ICD-10-CM | POA: Insufficient documentation

## 2022-03-05 ENCOUNTER — Ambulatory Visit: Payer: Medicare Other

## 2022-03-05 DIAGNOSIS — R2689 Other abnormalities of gait and mobility: Secondary | ICD-10-CM

## 2022-03-05 DIAGNOSIS — R42 Dizziness and giddiness: Secondary | ICD-10-CM

## 2022-03-05 DIAGNOSIS — R278 Other lack of coordination: Secondary | ICD-10-CM | POA: Diagnosis not present

## 2022-03-05 DIAGNOSIS — M542 Cervicalgia: Secondary | ICD-10-CM | POA: Diagnosis not present

## 2022-03-05 NOTE — Therapy (Signed)
OUTPATIENT PHYSICAL THERAPY VESTIBULAR TREATMENT     Patient Name: Shelby Reeves MRN: [id number] DOB:06/19/1945, 76 y.o., female Today's Date: 02/26/2022  PCP: Katherine Tabori  REFERRING PROVIDER: Katherine Tabori    Past Medical History:  Diagnosis Date   Allergy    Carpal tunnel syndrome    Fibroid    GERD (gastroesophageal reflux disease)    History of colon polyps 2002   Hyperlipidemia    Hypertension    Vertigo    Past Surgical History:  Procedure Laterality Date   BIOPSY THYROID     2018   CARPAL TUNNEL RELEASE     bilateral   CATARACT EXTRACTION W/ INTRAOCULAR LENS  IMPLANT, BILATERAL Bilateral 2018   July and August   COLONOSCOPY     hx tubular adenomas 2003   ENDOMETRIAL BIOPSY  09-12-09   --benign/Dr. Romine   HYSTEROSCOPY  10-22-09   resection of polyps and D & C-Dr. Romine   HYSTEROSCOPY  06-17-91   and D & C   KNEE ARTHROSCOPY  2010   right   POLYPECTOMY     UTERINE FIBROID SURGERY     Patient Active Problem List   Diagnosis Date Noted   Cyst of finger 10/03/2015   Severe obesity (BMI >= 40) (HCC) 12/07/2013   Ventral hernia 02/25/2013   Paresthesia of left arm 09/23/2012   Generalized anxiety disorder 09/23/2012   General medical examination 02/12/2011   Back pain 08/27/2010   ORTHOSTATIC HYPOTENSION 06/28/2010   VERTIGO 06/28/2010   Hyperlipidemia 05/06/2010   Essential hypertension 05/06/2010   RHINITIS 05/06/2010    ONSET DATE: 12/23/21  REFERRING DIAG: R42  THERAPY DIAG:  Dizziness and giddiness  Cervicalgia  Other lack of coordination  Other abnormalities of gait and mobility  Rationale for Evaluation and Treatment Rehabilitation  SUBJECTIVE:   SUBJECTIVE STATEMENT: I am good, I feel much much better.  Pt accompanied by: self  PERTINENT HISTORY: HTN, vertigo, bilateral carpal tunnel release   PAIN:  Are you having pain? No  PRECAUTIONS: None and Fall  WEIGHT BEARING RESTRICTIONS No  FALLS: Has patient fallen  in last 6 months? No  LIVING ENVIRONMENT: Lives with: lives alone Lives in: House/apartment Stairs: No Has following equipment at home: None  PLOF: Independent  PATIENT GOALS be able to walk straight without going off to the side   OBJECTIVE:   COGNITION: Overall cognitive status: Within functional limits for tasks assessed   SENSATION: WFL  POSTURE: rounded shoulders, forward head, and increased lumbar lordosis   Cervical ROM:  WFL   LOWER EXTREMITY MMT: grossly 5/5  GAIT: Gait pattern: deviates from walking in straight line sometimes, trunk flexed, and wide BOS Assistive device utilized: None Level of assistance: Complete Independence  PATIENT SURVEYS:  FOTO 52   VESTIBULAR ASSESSMENT    SYMPTOM BEHAVIOR:   Type of dizziness: Imbalance (Disequilibrium) and Unsteady with head/body turns   Frequency: 1-2x a day    Duration: 30s-1 min I sit down and it goes away   Aggravating factors: Induced by position change: supine to sit and if I move in any direction to quick    Relieving factors: rest   Progression of symptoms: better   OCULOMOTOR EXAM:   Ocular Alignment: normal   Ocular ROM: No Limitations   Spontaneous Nystagmus: absent   Gaze-Induced Nystagmus: absent   Smooth Pursuits: intact   Saccades: intact   Convergence/Divergence: 1-2 in    VESTIBULAR - OCULAR REFLEX:    Slow VOR: Normal     VOR Cancellation: Normal   Dynamic Visual Acuity: Dynamic: normal    POSITIONAL TESTING: Right Dix-Hallpike: no nystagmus and slight dizziness but goes away quick   MOTION SENSITIVITY:    Motion Sensitivity Quotient  Intensity: 0 = none, 1 = Lightheaded, 2 = Mild, 3 = Moderate, 4 = Severe, 5 = Vomiting  Intensity  1. Sitting to supine 3  2. Supine to L side 2  3. Supine to R side 3  4. Supine to sitting 3  5. L Hallpike-Dix   6. Up from L    7. R Hallpike-Dix 3  8. Up from R  3  9. Sitting, head  tipped to L knee 0  10. Head up from L  knee 1  11.  Sitting, head  tipped to R knee 0  12. Head up from R  knee 1  13. Sitting head turns x5 0  14.Sitting head nods x5 2  15. In stance, 180  turn to L  2  16. In stance, 180  turn to R 2     FUNCTIONAL GAIT: Berg Balance Scale: 46/56  TODAY'S TREATMENT 03/05/22 UBE L3 x3min each BERG  FOTO Walking on beam  Resisted side steps over obstacles 20#  Rows and Lats 25# 2x10 Shoulder ext 10# 2x10  STS on airex with ball catch red 2x10  02/27/22 NuStep L5 x6mins Walking on beam Standing on airex catching ball  Standing on airex hitting ball  Rows and Lats 25# 2x10 Shoulder ext 10# 2x10 Resisted gait 30# 4 way x3 each way  Walking and hitting ball    02/05/22 UBE L3 x6mins  Rows and Lats 25# 2x10 Quick turns around cones  Standing on airex cone taps- SBA Standing on airex picking up cones  Airex to 6" step up  DN to R upper trap   01/29/22 NuStep L5 x6mins  BERG Step ups 6" Standing on airex feet together,  half tandem 30s  Side steps on airex  Catch on airex, then volleyball hits on airex  Walking and catching ball forwards, backwards, and side steps  Shoulder ext 10# 2x10  Cable rows 10# 2x10  STM to bilateral upper traps   Check orthostatics  - Supine 147/85 - Sitting 120/87 - Standing 132/90 UBE L2 x4 mins  UT and scalenes stretch 30s  Shoulder ext 5# 2x10  Step up on airex,cues to clear feet to prevent tripping over it  Walk on beam side steps and narrow  Walking with head turns     PATIENT EDUCATION: Education details: POC Person educated: Patient Education method: Explanation Education comprehension: verbalized understanding   GOALS: Goals reviewed with patient? Yes  SHORT TERM GOALS: Target date: 02/11/22  Patient will be independent with initial HEP. Goal status: MET   LONG TERM GOALS: Target date: 03/18/22  Patient will be independent with advanced/ongoing HEP to improve outcomes and carryover.  Goal status: MET  2.  Patient  will report no dizziness with positional changes. Baseline: dizzy with quick movements,  Goal status: MET  3.  Patient will score 54 on Berg Balance test to demonstrate lower risk of falls.   Baseline: 46, 53/56-10/4/23, 55/56-11/8/23 Goal status: MET  4.  Patient will report 63 on FOTO to demonstrate improved functional ability. Baseline: 52, 64-03/05/22 MET  ASSESSMENT:  CLINICAL IMPRESSION: Patient doing well overall, states she is a lot better now. We reassessed her goals, she is pleased with her current level of function. Was advised to call back if   she has questions or if her symptoms return.    REHAB POTENTIAL: Good  CLINICAL DECISION MAKING: Stable/uncomplicated  EVALUATION COMPLEXITY: Low   PLAN: PT FREQUENCY: 1x/week  PT DURATION: 10 weeks  PLANNED INTERVENTIONS: Therapeutic exercises, Therapeutic activity, Neuromuscular re-education, Balance training, Gait training, Patient/Family education, Self Care, Joint mobilization, Vestibular training, Canalith repositioning, and Manual therapy  PLAN FOR NEXT SESSION: walking with head turns, walking beam, tasks on airex   PHYSICAL THERAPY DISCHARGE SUMMARY  Visits from Start of Care: 6   Patient agrees to discharge. Patient goals were met. Patient is being discharged due to meeting the stated rehab goals.   [place], PT 02/26/2022, 3:19 PM

## 2022-04-03 DIAGNOSIS — H02831 Dermatochalasis of right upper eyelid: Secondary | ICD-10-CM | POA: Diagnosis not present

## 2022-04-03 DIAGNOSIS — H57813 Brow ptosis, bilateral: Secondary | ICD-10-CM | POA: Diagnosis not present

## 2022-04-03 DIAGNOSIS — H02423 Myogenic ptosis of bilateral eyelids: Secondary | ICD-10-CM | POA: Diagnosis not present

## 2022-04-03 DIAGNOSIS — H02834 Dermatochalasis of left upper eyelid: Secondary | ICD-10-CM | POA: Diagnosis not present

## 2022-04-16 DIAGNOSIS — H53482 Generalized contraction of visual field, left eye: Secondary | ICD-10-CM | POA: Diagnosis not present

## 2022-04-16 DIAGNOSIS — H53483 Generalized contraction of visual field, bilateral: Secondary | ICD-10-CM | POA: Diagnosis not present

## 2022-04-16 DIAGNOSIS — H53481 Generalized contraction of visual field, right eye: Secondary | ICD-10-CM | POA: Diagnosis not present

## 2022-04-30 ENCOUNTER — Ambulatory Visit: Payer: Medicare Other | Admitting: Family Medicine

## 2022-04-30 ENCOUNTER — Encounter: Payer: Self-pay | Admitting: Family Medicine

## 2022-04-30 VITALS — BP 122/64 | HR 68 | Temp 98.4°F | Resp 16 | Ht 68.0 in | Wt 258.4 lb

## 2022-04-30 DIAGNOSIS — M62838 Other muscle spasm: Secondary | ICD-10-CM | POA: Diagnosis not present

## 2022-04-30 DIAGNOSIS — R42 Dizziness and giddiness: Secondary | ICD-10-CM

## 2022-04-30 MED ORDER — ONDANSETRON HCL 4 MG PO TABS: 4.0000 mg | ORAL_TABLET | Freq: Three times a day (TID) | ORAL | 0 refills | Status: DC | PRN

## 2022-04-30 NOTE — Patient Instructions (Addendum)
Follow up as needed or as scheduled TAKE the Meclizine as needed for dizziness Use the Tizanidine to help w/ the neck spasm Use a heating a pad to help w/ neck spasm Drink LOTS of water USE the Ondansetron as needed for nausea Change positions slowly!!!  Allow yourself time to adjust Call with any questions or concerns Stay Safe!  Stay Healthy! Hang in there!!!

## 2022-04-30 NOTE — Progress Notes (Signed)
   Subjective:    Patient ID: Shelby Reeves, female    DOB: 20-Jan-1946, 77 y.o.   MRN: [id number]  HPI Dizziness- pt has hx of vertigo.  Reports she has been dizzy for 3 days.  Pt reports occipital HA.  Has dizziness w/ turning head to L or R.  Has not taken any meclizine.  This feels similar to previous vertigo episode but worse.  Pt tried to do the Epley maneuver and this caused nausea.   Review of Systems For ROS see HPI     Objective:   Physical Exam Vitals reviewed.  Constitutional:      General: She is not in acute distress.    Appearance: Normal appearance. She is not ill-appearing.  HENT:     Head: Normocephalic and atraumatic.     Right Ear: Tympanic membrane and ear canal normal.     Left Ear: Tympanic membrane and ear canal normal.     Nose:     Comments: No TTP over frontal or maxillary sinuses Eyes:     Extraocular Movements: Extraocular movements intact.     Conjunctiva/sclera: Conjunctivae normal.     Pupils: Pupils are equal, round, and reactive to light.  Musculoskeletal:     Cervical back: Tenderness (TTP over R trap spasm) present.  Skin:    General: Skin is warm and dry.  Neurological:     General: No focal deficit present.     Mental Status: She is alert and oriented to person, place, and time.     Cranial Nerves: No cranial nerve deficit.  Psychiatric:        Mood and Affect: Mood normal.        Behavior: Behavior normal.        Thought Content: Thought content normal.           Assessment & Plan:  Vertigo- pt has hx of similar.  Forgot she had Meclizine to use for these episodes.  Encouraged increased water intake, meclizine prn.  Sent Zofran to use as needed for nausea.  Discussed modified Epply maneuver.  Reviewed supportive care and red flags that should prompt return.  Pt expressed understanding and is in agreement w/ plan.   Trap spasm- new.  Pt has notably tight upper trap on R that is TTP.  She has tizanidine at home.  Encouraged her to  use this and a heating pad prn.  Pt expressed understanding and is in agreement w/ plan.

## 2022-05-12 ENCOUNTER — Encounter: Payer: Self-pay | Admitting: Family Medicine

## 2022-05-12 DIAGNOSIS — R42 Dizziness and giddiness: Secondary | ICD-10-CM

## 2022-05-15 ENCOUNTER — Ambulatory Visit: Payer: Medicare Other | Attending: Family Medicine | Admitting: Physical Therapy

## 2022-05-15 ENCOUNTER — Encounter: Payer: Self-pay | Admitting: Physical Therapy

## 2022-05-15 DIAGNOSIS — R278 Other lack of coordination: Secondary | ICD-10-CM

## 2022-05-15 DIAGNOSIS — M542 Cervicalgia: Secondary | ICD-10-CM | POA: Diagnosis not present

## 2022-05-15 DIAGNOSIS — R2689 Other abnormalities of gait and mobility: Secondary | ICD-10-CM | POA: Diagnosis not present

## 2022-05-15 DIAGNOSIS — R42 Dizziness and giddiness: Secondary | ICD-10-CM | POA: Insufficient documentation

## 2022-05-15 NOTE — Therapy (Signed)
OUTPATIENT PHYSICAL THERAPY VESTIBULAR EVALUATION     Patient Name: Shelby Reeves MRN: [id number] DOB:09/13/1945, 77 y.o., female Today's Date: 05/15/2022  PCP: Annye Asa  REFERRING PROVIDER: Annye Asa   PT End of Session - 05/15/22 1404     Visit Number 1    Date for PT Re-Evaluation 08/14/22    Authorization Type [employer] Medicare    PT Start Time 1400    PT Stop Time 0737    PT Time Calculation (min) 45 min    Activity Tolerance Patient tolerated treatment well    Behavior During Therapy [facility] for tasks assessed/performed             Past Medical History:  Diagnosis Date   Allergy    Carpal tunnel syndrome    Fibroid    GERD (gastroesophageal reflux disease)    History of colon polyps 2002   Hyperlipidemia    Hypertension    Vertigo    Past Surgical History:  Procedure Laterality Date   BIOPSY THYROID     2018   CARPAL TUNNEL RELEASE     bilateral   CATARACT EXTRACTION W/ INTRAOCULAR LENS  IMPLANT, BILATERAL Bilateral 2018   July and August   COLONOSCOPY     hx tubular adenomas 2003   ENDOMETRIAL BIOPSY  09-12-09   --benign/Dr. Romine   HYSTEROSCOPY  10-22-09   resection of polyps and D & C-Dr. Joan Flores   HYSTEROSCOPY  06-17-91   and D & C   KNEE ARTHROSCOPY  2010   right   POLYPECTOMY     UTERINE FIBROID SURGERY     Patient Active Problem List   Diagnosis Date Noted   Cyst of finger 10/03/2015   Severe obesity (BMI >= 40) ([place]) 12/07/2013   Ventral hernia 02/25/2013   Paresthesia of left arm 09/23/2012   Generalized anxiety disorder 09/23/2012   General medical examination 02/12/2011   Back pain 08/27/2010   ORTHOSTATIC HYPOTENSION 06/28/2010   VERTIGO 06/28/2010   Hyperlipidemia 05/06/2010   Essential hypertension 05/06/2010   RHINITIS 05/06/2010    ONSET DATE: 12/23/21  REFERRING DIAG: R42  THERAPY DIAG:  Dizziness and giddiness  Cervicalgia  Other lack of coordination  Other abnormalities of gait and  mobility  Rationale for Evaluation and Treatment Rehabilitation  SUBJECTIVE:   SUBJECTIVE STATEMENT: Patient reports that she has had dizziness issues since January 1st, she reports rolled to the right, reports still really bothering her with extnesion Pt accompanied by: self and daughter Marcie Bal  PERTINENT HISTORY: HTN, vertigo, bilateral carpal tunnel release   PAIN:  Are you having pain? No  PRECAUTIONS: None and Fall  WEIGHT BEARING RESTRICTIONS No  FALLS: Has patient fallen in last 6 months? No  LIVING ENVIRONMENT: Lives with: lives alone Lives in: House/apartment Stairs: No Has following equipment at home: None  PLOF: Independent  PATIENT GOALS be able to walk straight without going off to the side   OBJECTIVE:   COGNITION: Overall cognitive status: Within functional limits for tasks assessed   SENSATION: WFL  POSTURE: rounded shoulders, forward head, and increased lumbar lordosis   Cervical ROM:  WFL   LOWER EXTREMITY MMT: grossly 5/5  GAIT: Gait pattern:  deviates from walking in straight line sometimes, trunk flexed, and wide BOS Assistive device utilized: None Level of assistance: currently CGA due to instability  PATIENT SURVEYS:  FOTO 52   VESTIBULAR ASSESSMENT    SYMPTOM BEHAVIOR:   Type of dizziness: Imbalance (Disequilibrium) and Unsteady with head/body turns  Frequency:all day   Duration: all day feels yucky   Aggravating factors: Induced by position change: supine to sit and if I move in any direction to quick   quick head motions   Relieving factors: rest holding still   Progression of symptoms: better   OCULOMOTOR EXAM:   Ocular Alignment: normal   Ocular ROM: No Limitations   Spontaneous Nystagmus: absent   Gaze-Induced Nystagmus: absent   Smooth Pursuits: nystagmus at end range   Saccades: undershoot and poor control   Convergence/Divergence: 1-2 in    VESTIBULAR - OCULAR REFLEX:    Slow VOR: Normal   VOR Cancellation:  Normal   Dynamic Visual Acuity: Dynamic: normal    POSITIONAL TESTING: Right Dix-Hallpike: no nystagmus and slight dizziness but goes away quick   + Left Dix Halpike with nystagmus   MOTION SENSITIVITY:    Motion Sensitivity Quotient  Intensity: 0 = none, 1 = Lightheaded, 2 = Mild, 3 = Moderate, 4 = Severe, 5 = Vomiting  Intensity  1. Sitting to supine 3  2. Supine to L side 4  3. Supine to R side 4  4. Supine to sitting 3  5. L Hallpike-Dix 4   6. Up from L  3  7. R Hallpike-Dix 3  8. Up from R  3  9. Sitting, head  tipped to L knee 2  10. Head up from L  knee 2  11. Sitting, head  tipped to R knee 2  12. Head up from R  knee 2  13. Sitting head turns x5 3  14.Sitting head nods x5 3  15. In stance, 180  turn to L  3  16. In stance, 180  turn to R 3    OTHOSTATICS: not done  FUNCTIONAL GAIT: Berg Balance Scale: 34/56 Functional gait assessment: TBD  05/15/22 Canalith repositioning:  left Epley maneuver with delayed symptoms significant reaction getting up to sitting so this was performed twice until no symptoms  PATIENT EDUCATION: Education details: POC Person educated: Patient and daughter Education method: Explanation Education comprehension: verbalized understanding   GOALS: Goals reviewed with patient? Yes  SHORT TERM GOALS: Target date: 05/28/22  Patient will be independent with initial HEP. Goal status: INITIAL   LONG TERM GOALS: Target date: 08/14/22  Patient will be independent with advanced/ongoing HEP to improve outcomes and carryover.  Goal status: INITIAL  2.  Patient will report no dizziness with positional changes. Baseline: dizzy with quick movementse Goal status: INITIAL  3.  Patient will score 54 on Berg Balance test to demonstrate lower risk of falls.   Baseline: 46 Goal status: INITIAL  4.  Patient will report 25 on FOTO to demonstrate improved functional ability. Baseline: 52 Goal status: INITIAL  ASSESSMENT:  CLINICAL  IMPRESSION: Patient is a 77 y.o. female who was seen today for physical therapy evaluation and treatment for vertigo. This started about 2-3 weeks.  She reports that she woke up with the dizziness and she has had it since, she reports afraid to move, her daughter had looked on line and tried the Epley maneuver but no changes.  She had a positive left ear [place] with delayed symptoms after about 20 seconds had the nystagmus and dizziness that last 20 seconds, the move that really caused a severe push to the posterior was getting up from right side lying.  I did perform twice until we did not have the symptoms.  Gave handout on after Elpley  REHAB POTENTIAL: Good  CLINICAL DECISION MAKING: Stable/uncomplicated  EVALUATION COMPLEXITY: Low   PLAN: PT FREQUENCY: 1x/week  PT DURATION: 10 weeks  PLANNED INTERVENTIONS: Therapeutic exercises, Therapeutic activity, Neuromuscular re-education, Balance training, Gait training, Patient/Family education, Self Care, Joint mobilization, Vestibular training, Canalith repositioning, and Manual therapy  PLAN FOR NEXT SESSION: See how she is doing could repeat, could look at horizontal canal   Sumner Boast, PT 05/15/2022, 2:06 PM

## 2022-05-15 NOTE — Addendum Note (Signed)
Addended by: [name] on: 05/15/2022 03:50 PM   Modules accepted: Orders

## 2022-05-16 ENCOUNTER — Other Ambulatory Visit: Payer: Self-pay | Admitting: Family Medicine

## 2022-05-16 DIAGNOSIS — E785 Hyperlipidemia, unspecified: Secondary | ICD-10-CM

## 2022-05-16 DIAGNOSIS — I10 Essential (primary) hypertension: Secondary | ICD-10-CM

## 2022-05-23 ENCOUNTER — Ambulatory Visit: Payer: Medicare Other | Admitting: Physical Therapy

## 2022-05-23 ENCOUNTER — Encounter: Payer: Self-pay | Admitting: Physical Therapy

## 2022-05-23 DIAGNOSIS — R278 Other lack of coordination: Secondary | ICD-10-CM | POA: Diagnosis not present

## 2022-05-23 DIAGNOSIS — M542 Cervicalgia: Secondary | ICD-10-CM

## 2022-05-23 DIAGNOSIS — R42 Dizziness and giddiness: Secondary | ICD-10-CM

## 2022-05-23 DIAGNOSIS — R2689 Other abnormalities of gait and mobility: Secondary | ICD-10-CM | POA: Diagnosis not present

## 2022-05-23 NOTE — Therapy (Signed)
OUTPATIENT PHYSICAL THERAPY VESTIBULAR TREATMENT   Patient Name: Shelby Reeves MRN: [id number] DOB:08/06/1945, 77 y.o., female Today's Date: 05/23/2022  PCP: Annye Asa  REFERRING PROVIDER: Annye Asa   PT End of Session - 05/23/22 0927     Visit Number 2    Date for PT Re-Evaluation 08/14/22    Authorization Type [employer] Medicare    PT Start Time 0930    PT Stop Time 4098    PT Time Calculation (min) 45 min    Activity Tolerance Patient tolerated treatment well    Behavior During Therapy [facility] for tasks assessed/performed              Past Medical History:  Diagnosis Date   Allergy    Carpal tunnel syndrome    Fibroid    GERD (gastroesophageal reflux disease)    History of colon polyps 2002   Hyperlipidemia    Hypertension    Vertigo    Past Surgical History:  Procedure Laterality Date   BIOPSY THYROID     2018   CARPAL TUNNEL RELEASE     bilateral   CATARACT EXTRACTION W/ INTRAOCULAR LENS  IMPLANT, BILATERAL Bilateral 2018   July and August   COLONOSCOPY     hx tubular adenomas 2003   ENDOMETRIAL BIOPSY  09-12-09   --benign/Dr. Romine   HYSTEROSCOPY  10-22-09   resection of polyps and D & C-Dr. Joan Flores   HYSTEROSCOPY  06-17-91   and D & C   KNEE ARTHROSCOPY  2010   right   POLYPECTOMY     UTERINE FIBROID SURGERY     Patient Active Problem List   Diagnosis Date Noted   Cyst of finger 10/03/2015   Severe obesity (BMI >= 40) ([place]) 12/07/2013   Ventral hernia 02/25/2013   Paresthesia of left arm 09/23/2012   Generalized anxiety disorder 09/23/2012   General medical examination 02/12/2011   Back pain 08/27/2010   ORTHOSTATIC HYPOTENSION 06/28/2010   VERTIGO 06/28/2010   Hyperlipidemia 05/06/2010   Essential hypertension 05/06/2010   RHINITIS 05/06/2010    ONSET DATE: 12/23/21  REFERRING DIAG: R42  THERAPY DIAG:  Dizziness and giddiness  Cervicalgia  Other lack of coordination  Other abnormalities of gait and mobility  Rationale  for Evaluation and Treatment Rehabilitation  SUBJECTIVE:   SUBJECTIVE STATEMENT: Pt states she's had 3 good days. "Overall I can walk straight now."  Pt accompanied by: self and daughter Marcie Bal  PERTINENT HISTORY: HTN, vertigo, bilateral carpal tunnel release  From eval: Patient reports that she has had dizziness issues since January 1st, she reports rolled to the right, reports still really bothering her with extnesion  PAIN:  Are you having pain? No  PRECAUTIONS: None and Fall  WEIGHT BEARING RESTRICTIONS No  FALLS: Has patient fallen in last 6 months? No  LIVING ENVIRONMENT: Lives with: lives alone Lives in: House/apartment Stairs: No Has following equipment at home: None  PLOF: Independent  PATIENT GOALS be able to walk straight without going off to the side   OBJECTIVE:   POSTURE: rounded shoulders, forward head, and increased lumbar lordosis   Cervical ROM:  WFL   LOWER EXTREMITY MMT: grossly 5/5  GAIT: Gait pattern:  deviates from walking in straight line sometimes, trunk flexed, and wide BOS Assistive device utilized: None Level of assistance: currently CGA due to instability  PATIENT SURVEYS:  FOTO 52   VESTIBULAR ASSESSMENT    SYMPTOM BEHAVIOR:   Type of dizziness: Imbalance (Disequilibrium) and Unsteady with head/body turns  Frequency:all day   Duration: all day feels yucky   Aggravating factors: Induced by position change: supine to sit and if I move in any direction to quick   quick head motions   Relieving factors: rest holding still   Progression of symptoms: better   OCULOMOTOR EXAM:   Ocular Alignment: normal   Ocular ROM: No Limitations   Spontaneous Nystagmus: absent   Gaze-Induced Nystagmus: absent   Smooth Pursuits: nystagmus at end range   Saccades: undershoot and poor control   Convergence/Divergence: 1-2 in    VESTIBULAR - OCULAR REFLEX:    Slow VOR: Normal   VOR Cancellation: Normal   Dynamic Visual Acuity: Dynamic:  normal    POSITIONAL TESTING: Right Dix-Hallpike: no nystagmus and slight dizziness but goes away quick   + Left Dix Halpike with nystagmus   MOTION SENSITIVITY:    Motion Sensitivity Quotient  Intensity: 0 = none, 1 = Lightheaded, 2 = Mild, 3 = Moderate, 4 = Severe, 5 = Vomiting  Intensity  1. Sitting to supine 3  2. Supine to L side 4  3. Supine to R side 4  4. Supine to sitting 3  5. L Hallpike-Dix 4   6. Up from L  3  7. R Hallpike-Dix 3  8. Up from R  3  9. Sitting, head  tipped to L knee 2  10. Head up from L  knee 2  11. Sitting, head  tipped to R knee 2  12. Head up from R  knee 2  13. Sitting head turns x5 3  14.Sitting head nods x5 3  15. In stance, 180  turn to L  3  16. In stance, 180  turn to R 3    OTHOSTATICS: not done  FUNCTIONAL GAIT: Berg Balance Scale: 34/56 Functional gait assessment: TBD  TREATMENT: 05/23/22 Dix-Hallpike R x1: no nystagmus noted Dix-Hallpike L x2: ~5 sec of symptoms, no symptoms on rep 2 Sidelying position L&R: no nystagmus for horizontal canalith  Epley maneuver R x1 Epley maneuver L x2 Sitting VOR x 1; x10 head turns and then x10 head nods   05/15/22 Canalith repositioning:  left Epley maneuver with delayed symptoms significant reaction getting up to sitting so this was performed twice until no symptoms  PATIENT EDUCATION: Education details: POC Person educated: Patient and daughter Education method: Explanation Education comprehension: verbalized understanding   GOALS: Goals reviewed with patient? Yes  SHORT TERM GOALS: Target date: 05/28/22  Patient will be independent with initial HEP. Goal status: INITIAL   LONG TERM GOALS: Target date: 08/14/22  Patient will be independent with advanced/ongoing HEP to improve outcomes and carryover.  Goal status: INITIAL  2.  Patient will report no dizziness with positional changes. Baseline: dizzy with quick movementse Goal status: INITIAL  3.  Patient will score  54 on Berg Balance test to demonstrate lower risk of falls.   Baseline: 46 Goal status: INITIAL  4.  Patient will report 59 on FOTO to demonstrate improved functional ability. Baseline: 52 Goal status: INITIAL  ASSESSMENT:  CLINICAL IMPRESSION: Pt demos less intense symptoms with L Dix-Hallpike. After 2nd Epley maneuver, pt did not appear to have any symptoms on retesting L posterior canalith. No symptoms indicating horizontal canalithiasis BPPV. Increased time discussing slow return to normal sleeping position, gentle cervical ROM (pt notes her neck has been getting tight due to self limiting her head motions), and initiating gaze stabilization exercises.   REHAB POTENTIAL: Good  CLINICAL DECISION MAKING: Stable/uncomplicated  EVALUATION COMPLEXITY: Low   PLAN: PT FREQUENCY: 1x/week  PT DURATION: 10 weeks  PLANNED INTERVENTIONS: Therapeutic exercises, Therapeutic activity, Neuromuscular re-education, Balance training, Gait training, Patient/Family education, Self Care, Joint mobilization, Vestibular training, Canalith repositioning, and Manual therapy  PLAN FOR NEXT SESSION: See how she is doing could repeat. Cervical ROM/stretching. Gaze stabilization exercises. Re-check balance.    Liza Czerwinski April Ma L Christella App, PT 05/23/2022, 9:27 AM

## 2022-06-03 ENCOUNTER — Ambulatory Visit: Payer: Medicare Other | Admitting: Physical Therapy

## 2022-06-05 ENCOUNTER — Ambulatory Visit: Payer: Medicare Other | Attending: Family Medicine | Admitting: Physical Therapy

## 2022-06-05 ENCOUNTER — Encounter: Payer: Self-pay | Admitting: Physical Therapy

## 2022-06-05 DIAGNOSIS — R2689 Other abnormalities of gait and mobility: Secondary | ICD-10-CM | POA: Insufficient documentation

## 2022-06-05 DIAGNOSIS — R42 Dizziness and giddiness: Secondary | ICD-10-CM | POA: Insufficient documentation

## 2022-06-05 DIAGNOSIS — M542 Cervicalgia: Secondary | ICD-10-CM | POA: Insufficient documentation

## 2022-06-05 NOTE — Therapy (Signed)
OUTPATIENT PHYSICAL THERAPY VESTIBULAR TREATMENT   Patient Name: Shelby Reeves MRN: [id number] DOB:1945-10-20, 77 y.o., female Today's Date: 06/05/2022  PCP: Annye Asa  REFERRING PROVIDER: Annye Asa   PT End of Session - 06/05/22 1402     Visit Number 3    Date for PT Re-Evaluation 08/14/22    Authorization Type BCBC Medicare    PT Start Time 1400    PT Stop Time 1444    PT Time Calculation (min) 44 min    Activity Tolerance Patient tolerated treatment well    Behavior During Therapy [facility] for tasks assessed/performed              Past Medical History:  Diagnosis Date   Allergy    Carpal tunnel syndrome    Fibroid    GERD (gastroesophageal reflux disease)    History of colon polyps 2002   Hyperlipidemia    Hypertension    Vertigo    Past Surgical History:  Procedure Laterality Date   BIOPSY THYROID     2018   CARPAL TUNNEL RELEASE     bilateral   CATARACT EXTRACTION W/ INTRAOCULAR LENS  IMPLANT, BILATERAL Bilateral 2018   July and August   COLONOSCOPY     hx tubular adenomas 2003   ENDOMETRIAL BIOPSY  09-12-09   --benign/Dr. Romine   HYSTEROSCOPY  10-22-09   resection of polyps and D & C-Dr. Joan Flores   HYSTEROSCOPY  06-17-91   and D & C   KNEE ARTHROSCOPY  2010   right   POLYPECTOMY     UTERINE FIBROID SURGERY     Patient Active Problem List   Diagnosis Date Noted   Cyst of finger 10/03/2015   Severe obesity (BMI >= 40) ([place]) 12/07/2013   Ventral hernia 02/25/2013   Paresthesia of left arm 09/23/2012   Generalized anxiety disorder 09/23/2012   General medical examination 02/12/2011   Back pain 08/27/2010   ORTHOSTATIC HYPOTENSION 06/28/2010   VERTIGO 06/28/2010   Hyperlipidemia 05/06/2010   Essential hypertension 05/06/2010   RHINITIS 05/06/2010    ONSET DATE: 12/23/21  REFERRING DIAG: R42  THERAPY DIAG:  Dizziness and giddiness  Cervicalgia  Other abnormalities of gait and mobility  Rationale for Evaluation and Treatment  Rehabilitation  SUBJECTIVE:   SUBJECTIVE STATEMENT: Pt states she's doing better dizziness now 3-4/10 on scale, reports that when she leans back in the recliner with her head back she will get the dizziness and she has also noticed it when looking down and to the left  Pt accompanied by: self   PERTINENT HISTORY: HTN, vertigo, bilateral carpal tunnel release  From eval: Patient reports that she has had dizziness issues since January 1st, she reports rolled to the right, reports still really bothering her with extnesion  PAIN:  Are you having pain? No  PRECAUTIONS: None and Fall  WEIGHT BEARING RESTRICTIONS No  FALLS: Has patient fallen in last 6 months? No  LIVING ENVIRONMENT: Lives with: lives alone Lives in: House/apartment Stairs: No Has following equipment at home: None  PLOF: Independent  PATIENT GOALS be able to walk straight without going off to the side   OBJECTIVE:   POSTURE: rounded shoulders, forward head, and increased lumbar lordosis   Cervical ROM:  WFL   LOWER EXTREMITY MMT: grossly 5/5  GAIT: Gait pattern:  deviates from walking in straight line sometimes, trunk flexed, and wide BOS Assistive device utilized: None Level of assistance: currently CGA due to instability  PATIENT SURVEYS:  FOTO 52  VESTIBULAR ASSESSMENT    SYMPTOM BEHAVIOR:   Type of dizziness: Imbalance (Disequilibrium) and Unsteady with head/body turns   Frequency:all day   Duration: all day feels yucky   Aggravating factors: Induced by position change: supine to sit and if I move in any direction to quick   quick head motions   Relieving factors: rest holding still   Progression of symptoms: better   OCULOMOTOR EXAM:   Ocular Alignment: normal   Ocular ROM: No Limitations   Spontaneous Nystagmus: absent   Gaze-Induced Nystagmus: absent   Smooth Pursuits: nystagmus at end range   Saccades: undershoot and poor control   Convergence/Divergence: 1-2 in    VESTIBULAR -  OCULAR REFLEX:    Slow VOR: Normal   VOR Cancellation: Normal   Dynamic Visual Acuity: Dynamic: normal    POSITIONAL TESTING: Right Dix-Hallpike: no nystagmus and slight dizziness but goes away quick   + Left Dix Halpike with nystagmus   MOTION SENSITIVITY:    Motion Sensitivity Quotient  Intensity: 0 = none, 1 = Lightheaded, 2 = Mild, 3 = Moderate, 4 = Severe, 5 = Vomiting  Intensity  1. Sitting to supine 3  2. Supine to L side 4  3. Supine to R side 4  4. Supine to sitting 3  5. L Hallpike-Dix 4   6. Up from L  3  7. R Hallpike-Dix 3  8. Up from R  3  9. Sitting, head  tipped to L knee 2  10. Head up from L  knee 2  11. Sitting, head  tipped to R knee 2  12. Head up from R  knee 2  13. Sitting head turns x5 3  14.Sitting head nods x5 3  15. In stance, 180  turn to L  3  16. In stance, 180  turn to R 3    OTHOSTATICS: not done  FUNCTIONAL GAIT: Berg Balance Scale: 34/56 Functional gait assessment: TBD  TREATMENT: 06/05/22 Walk outside around the parking island, negotiated curb, slope, with looking up and around cues Issued went over and performed VOR exercises Left Epley maneuver with mild symptoms STM to the right cervical and upper trap  05/23/22 Dix-Hallpike R x1: no nystagmus noted Dix-Hallpike L x2: ~5 sec of symptoms, no symptoms on rep 2 Sidelying position L&R: no nystagmus for horizontal canalith  Epley maneuver R x1 Epley maneuver L x2 Sitting VOR x 1; x10 head turns and then x10 head nods   05/15/22 Canalith repositioning:  left Epley maneuver with delayed symptoms significant reaction getting up to sitting so this was performed twice until no symptoms  PATIENT EDUCATION: Education details: POC Person educated: Patient and daughter Education method: Explanation Education comprehension: verbalized understanding   GOALS: Goals reviewed with patient? Yes  SHORT TERM GOALS: Target date: 05/28/22  Patient will be independent with initial  HEP. Goal status: met   LONG TERM GOALS: Target date: 08/14/22  Patient will be independent with advanced/ongoing HEP to improve outcomes and carryover.  Goal status: progressing 06/05/22  2.  Patient will report no dizziness with positional changes. Baseline: dizzy with quick movementse Goal status: ongoing 06/05/22  3.  Patient will score 54 on Berg Balance test to demonstrate lower risk of falls.   Baseline: 46 Goal status: INITIAL  4.  Patient will report 52 on FOTO to demonstrate improved functional ability. Baseline: 52 Goal status: INITIAL  ASSESSMENT:  CLINICAL IMPRESSION: Pt demos less intense symptoms overall but is having symptoms with cervical  extension, she is also c/o neck pain possibly due to not moving head much as she is gaurded with her motions.  Left Epley today had very minimal symptoms in the first position and then really pretty cleared after that.  I gave her VOR exercises and went over these with her  REHAB POTENTIAL: Good  CLINICAL DECISION MAKING: Stable/uncomplicated  EVALUATION COMPLEXITY: Low   PLAN: PT FREQUENCY: 1x/week  PT DURATION: 10 weeks  PLANNED INTERVENTIONS: Therapeutic exercises, Therapeutic activity, Neuromuscular re-education, Balance training, Gait training, Patient/Family education, Self Care, Joint mobilization, Vestibular training, Canalith repositioning, and Manual therapy  PLAN FOR NEXT SESSION: See how the VOR exercises are going.  Could continue to try to clear left BPPV   Sumner Boast, PT 06/05/2022, 2:03 PM

## 2022-06-11 ENCOUNTER — Ambulatory Visit: Payer: Medicare Other | Admitting: Physical Therapy

## 2022-06-18 ENCOUNTER — Encounter: Payer: Self-pay | Admitting: Physical Therapy

## 2022-06-18 ENCOUNTER — Ambulatory Visit: Payer: Medicare Other | Admitting: Physical Therapy

## 2022-06-18 DIAGNOSIS — M542 Cervicalgia: Secondary | ICD-10-CM

## 2022-06-18 DIAGNOSIS — R42 Dizziness and giddiness: Secondary | ICD-10-CM | POA: Diagnosis not present

## 2022-06-18 DIAGNOSIS — R2689 Other abnormalities of gait and mobility: Secondary | ICD-10-CM | POA: Diagnosis not present

## 2022-06-18 NOTE — Therapy (Signed)
OUTPATIENT PHYSICAL THERAPY VESTIBULAR TREATMENT   Patient Name: Shelby Reeves MRN: [id number] DOB:[birth year], 77 y.o., female Today's Date: 06/18/2022  PCP: Annye Asa  REFERRING PROVIDER: Annye Asa   PT End of Session - 06/18/22 1052     Visit Number 4    Date for PT Re-Evaluation 08/14/22    Authorization Type BCBC Medicare    PT Start Time 1052    PT Stop Time 1133    PT Time Calculation (min) 41 min    Activity Tolerance Patient tolerated treatment well    Behavior During Therapy WFL for tasks assessed/performed              Past Medical History:  Diagnosis Date   Allergy    Carpal tunnel syndrome    Fibroid    GERD (gastroesophageal reflux disease)    History of colon polyps 2002   Hyperlipidemia    Hypertension    Vertigo    Past Surgical History:  Procedure Laterality Date   BIOPSY THYROID     2018   CARPAL TUNNEL RELEASE     bilateral   CATARACT EXTRACTION W/ INTRAOCULAR LENS  IMPLANT, BILATERAL Bilateral 2018   July and August   COLONOSCOPY     hx tubular adenomas 2003   ENDOMETRIAL BIOPSY  09-12-09   --benign/Dr. Romine   HYSTEROSCOPY  10-22-09   resection of polyps and D & C-Dr. Joan Flores   HYSTEROSCOPY  06-17-91   and D & C   KNEE ARTHROSCOPY  2010   right   POLYPECTOMY     UTERINE FIBROID SURGERY     Patient Active Problem List   Diagnosis Date Noted   Cyst of finger 10/03/2015   Severe obesity (BMI >= 40) ([place]) 12/07/2013   Ventral hernia 02/25/2013   Paresthesia of left arm 09/23/2012   Generalized anxiety disorder 09/23/2012   General medical examination 02/12/2011   Back pain 08/27/2010   ORTHOSTATIC HYPOTENSION 06/28/2010   VERTIGO 06/28/2010   Hyperlipidemia 05/06/2010   Essential hypertension 05/06/2010   RHINITIS 05/06/2010    ONSET DATE: 12/23/21  REFERRING DIAG: R42  THERAPY DIAG:  Dizziness and giddiness  Cervicalgia  Rationale for Evaluation and Treatment Rehabilitation  SUBJECTIVE:   SUBJECTIVE  STATEMENT: Pt states she's doing better dizziness now 2/10 on scale, reports that when she leans back in the recliner with her head back she will get the dizziness.  She was able to go to [employer] and shop and it was very busy and she did not have any issues.    Pt accompanied by: self   PERTINENT HISTORY: HTN, vertigo, bilateral carpal tunnel release  From eval: Patient reports that she has had dizziness issues since January 1st, she reports rolled to the right, reports still really bothering her with extnesion  PAIN:  Are you having pain? No  PRECAUTIONS: None and Fall  WEIGHT BEARING RESTRICTIONS No  FALLS: Has patient fallen in last 6 months? No  LIVING ENVIRONMENT: Lives with: lives alone Lives in: House/apartment Stairs: No Has following equipment at home: None  PLOF: Independent  PATIENT GOALS be able to walk straight without going off to the side   OBJECTIVE:   POSTURE: rounded shoulders, forward head, and increased lumbar lordosis   Cervical ROM:  WFL   LOWER EXTREMITY MMT: grossly 5/5  GAIT: Gait pattern:  deviates from walking in straight line sometimes, trunk flexed, and wide BOS Assistive device utilized: None Level of assistance: currently CGA due to instability  PATIENT  SURVEYS:  FOTO 52   VESTIBULAR ASSESSMENT    SYMPTOM BEHAVIOR:   Type of dizziness: Imbalance (Disequilibrium) and Unsteady with head/body turns   Frequency:all day   Duration: all day feels yucky   Aggravating factors: Induced by position change: supine to sit and if I move in any direction to quick   quick head motions   Relieving factors: rest holding still   Progression of symptoms: better   OCULOMOTOR EXAM:   Ocular Alignment: normal   Ocular ROM: No Limitations   Spontaneous Nystagmus: absent   Gaze-Induced Nystagmus: absent   Smooth Pursuits: nystagmus at end range   Saccades: undershoot and poor control   Convergence/Divergence: 1-2 in    VESTIBULAR - OCULAR REFLEX:     Slow VOR: Normal   VOR Cancellation: Normal   Dynamic Visual Acuity: Dynamic: normal    POSITIONAL TESTING: Right Dix-Hallpike: no nystagmus and slight dizziness but goes away quick   + Left Dix Halpike with nystagmus   MOTION SENSITIVITY:    Motion Sensitivity Quotient  Intensity: 0 = none, 1 = Lightheaded, 2 = Mild, 3 = Moderate, 4 = Severe, 5 = Vomiting  Intensity  1. Sitting to supine 3  2. Supine to L side 4  3. Supine to R side 4  4. Supine to sitting 3  5. L Hallpike-Dix 4   6. Up from L  3  7. R Hallpike-Dix 3  8. Up from R  3  9. Sitting, head  tipped to L knee 2  10. Head up from L  knee 2  11. Sitting, head  tipped to R knee 2  12. Head up from R  knee 2  13. Sitting head turns x5 3  14.Sitting head nods x5 3  15. In stance, 180  turn to L  3  16. In stance, 180  turn to R 3    OTHOSTATICS: not done  FUNCTIONAL GAIT: Berg Balance Scale: 34/56 Functional gait assessment: TBD  TREATMENT: 06/18/22 Left Epley maneuver very mild symptoms Reviewed HEP for VOR added Brandt-Daroff STM to the cervical area  06/05/22 Walk outside around the parking island, negotiated curb, slope, with looking up and around cues Issued went over and performed VOR exercises Left Epley maneuver with mild symptoms STM to the right cervical and upper trap  05/23/22 Dix-Hallpike R x1: no nystagmus noted Dix-Hallpike L x2: ~5 sec of symptoms, no symptoms on rep 2 Sidelying position L&R: no nystagmus for horizontal canalith  Epley maneuver R x1 Epley maneuver L x2 Sitting VOR x 1; x10 head turns and then x10 head nods   05/15/22 Canalith repositioning:  left Epley maneuver with delayed symptoms significant reaction getting up to sitting so this was performed twice until no symptoms  PATIENT EDUCATION: Education details: POC Person educated: Patient and daughter Education method: Explanation Education comprehension: verbalized understanding   GOALS: Goals reviewed  with patient? Yes  SHORT TERM GOALS: Target date: 05/28/22  Patient will be independent with initial HEP. Goal status: met   LONG TERM GOALS: Target date: 08/14/22  Patient will be independent with advanced/ongoing HEP to improve outcomes and carryover.  Goal status: met  2.  Patient will report no dizziness with positional changes. Baseline: dizzy with quick movementse Goal status: ongoing 06/05/22  3.  Patient will score 54 on Berg Balance test to demonstrate lower risk of falls.   Baseline: 46 Goal status: INITIAL  4.  Patient will report 23 on FOTO to demonstrate improved  functional ability. Baseline: 52 Goal status: met   ASSESSMENT:  CLINICAL IMPRESSION: Pt continues to do very well, she was able to go to a very busy shopping area and not feel dizzy and did not have to leave.  I did the left Epley again today and very minimal symptoms lasting 2 seconds in the first position, she remains very tight in the neck musculature and this is from her being afraid to move for a long period of time.  I did add Brandt-daroff exercises  REHAB POTENTIAL: Good  CLINICAL DECISION MAKING: Stable/uncomplicated  EVALUATION COMPLEXITY: Low   PLAN: PT FREQUENCY: 1x/week  PT DURATION: 10 weeks  PLANNED INTERVENTIONS: Therapeutic exercises, Therapeutic activity, Neuromuscular re-education, Balance training, Gait training, Patient/Family education, Self Care, Joint mobilization, Vestibular training, Canalith repositioning, and Manual therapy  PLAN FOR NEXT SESSION: Hold treatment, she is approved this authorization until April 18, I asked her to my chart message me or call if issues arise, otherwise we will hold  Sumner Boast, PT 06/18/2022, 10:55 AM

## 2022-06-23 ENCOUNTER — Other Ambulatory Visit: Payer: Self-pay

## 2022-06-23 DIAGNOSIS — I10 Essential (primary) hypertension: Secondary | ICD-10-CM

## 2022-06-23 MED ORDER — LOSARTAN POTASSIUM 50 MG PO TABS: 100.0000 mg | ORAL_TABLET | Freq: Every day | ORAL | 1 refills | Status: DC

## 2022-07-30 ENCOUNTER — Telehealth: Payer: Self-pay | Admitting: Family Medicine

## 2022-07-30 NOTE — Telephone Encounter (Signed)
Contacted Christean Grief Engen to schedule their annual wellness visit. Call back at later date: 08/01/2022  Patient was driving to [place]  Thank you,  [place] Direct dial  [phone]

## 2022-08-01 ENCOUNTER — Encounter: Payer: Self-pay | Admitting: Family Medicine

## 2022-08-01 ENCOUNTER — Telehealth (INDEPENDENT_AMBULATORY_CARE_PROVIDER_SITE_OTHER): Payer: Medicare Other | Admitting: Family Medicine

## 2022-08-01 VITALS — BP 127/70 | HR 70 | Ht 68.0 in | Wt 264.0 lb

## 2022-08-01 DIAGNOSIS — U071 COVID-19: Secondary | ICD-10-CM | POA: Diagnosis not present

## 2022-08-01 MED ORDER — BENZONATATE 200 MG PO CAPS: 200.0000 mg | ORAL_CAPSULE | Freq: Three times a day (TID) | ORAL | 0 refills | Status: AC | PRN

## 2022-08-01 MED ORDER — NIRMATRELVIR/RITONAVIR (PAXLOVID)TABLET
3.0000 | ORAL_TABLET | Freq: Two times a day (BID) | ORAL | 0 refills | Status: AC
Start: 2022-08-01 — End: 2022-08-06

## 2022-08-01 NOTE — Progress Notes (Signed)
Virtual Visit via Telephone Note  I connected with Shelby Reeves on 08/01/22 at 9:02 AM by a telephone since she was not able to connect through MyChart and verified that I am speaking with the correct person using two identifiers. Call ended at 8:53 AM.   Patient Location: Home Provider Location[facility]    I discussed the limitations, risks, security and privacy concerns of performing an evaluation and management service by telephone and the availability of in person appointments. I also discussed with the patient that there may be a patient responsible charge related to this service. The patient expressed understanding and agreed to proceed, consent obtained  Chief Complaint  Patient presents with   Covid Positive    Pt reports she has tested positive for COVID 07/31/2022. Sx- coughing,chills,fever, congestion. Pt can not get on my chart. She has iphone and would like facetime.    History of Present Illness: Shelby Reeves is a 77 y.o. female that tested positive for covid yesterday through a home test. She is experiencing cough, chills, unmeasured fever, mild sore throat, mild headache, and nasal congestions. Coughing varies between non productive and productive.   Denies chest pain or shortness of breath. Denies ear pain or drainage.  Denies dizziness or lightheadedness.  Denies nausea.   Coughing started Wednesday and symptoms progressed into Thursday.   Patient Active Problem List   Diagnosis Date Noted   Otitis externa of right ear 09/09/2021   Thyroid mass 03/04/2016   Digital mucinous cyst of finger of right hand 12/10/2015   Cyst of finger 10/03/2015   Severe obesity (BMI >= 40) 12/07/2013   Ventral hernia 02/25/2013   Paresthesia of left arm 09/23/2012   Generalized anxiety disorder 09/23/2012   General medical examination 02/12/2011   Back pain 08/27/2010   ORTHOSTATIC HYPOTENSION 06/28/2010   VERTIGO 06/28/2010   Hyperlipidemia 05/06/2010    Essential hypertension 05/06/2010   RHINITIS 05/06/2010   Past Medical History:  Diagnosis Date   Allergy    Carpal tunnel syndrome    Fibroid    GERD (gastroesophageal reflux disease)    History of colon polyps 2002   Hyperlipidemia    Hypertension    Vertigo    Past Surgical History:  Procedure Laterality Date   BIOPSY THYROID     2018   CARPAL TUNNEL RELEASE     bilateral   CATARACT EXTRACTION W/ INTRAOCULAR LENS  IMPLANT, BILATERAL Bilateral 2018   July and August   COLONOSCOPY     hx tubular adenomas 2003   ENDOMETRIAL BIOPSY  09-12-09   --benign/Dr. Romine   HYSTEROSCOPY  10-22-09   resection of polyps and D & C-Dr. Tresa Resomine   HYSTEROSCOPY  06-17-91   and D & C   KNEE ARTHROSCOPY  2010   right   POLYPECTOMY     UTERINE FIBROID SURGERY     Allergies  Allergen Reactions   Dust Mite Extract Cough   Iodine Hives   Shellfish Allergy Hives   Prior to Admission medications   Medication Sig Start Date End Date Taking? Authorizing Provider  aspirin 81 MG tablet Take 81 mg by mouth daily.   Yes [provider]  Aspirin-Calcium Carbonate (BAYER WOMENS) [phone] MG TABS Take by mouth. 03/04/16  Yes [provider]  Calcium Citrate-Vitamin D (CAL-CITRATE PLUS VITAMIN D) 250-2.5 MG-MCG TABS Take by mouth. 09/09/21  Yes [provider]  clotrimazole (LOTRIMIN) 1 % external solution 3 drops in R ear twice  daily x14 days 08/16/21  Yes Sheliah Hatchabori, Katherine E, MD  hydrochlorothiazide (HYDRODIURIL) 12.5 MG tablet TAKE 1 TO 2 TABLETS BY MOUTH EVERY DAY DEPENDING ON SWELLING 05/16/22  Yes Sheliah Hatchabori, Katherine E, MD  losartan (COZAAR) 50 MG tablet Take 2 tablets (100 mg total) by mouth daily. 06/23/22  Yes Sheliah Hatchabori, Katherine E, MD  meclizine (ANTIVERT) 25 MG tablet Take 1 tablet (25 mg total) by mouth 3 (three) times daily as needed for dizziness. 12/23/21  Yes Sheliah Hatchabori, Katherine E, MD  Multiple Vitamin (MULTIVITAMIN) tablet Take 1 tablet by mouth daily.   Yes [provider]  Multiple Vitamins-Minerals (HAIR SKIN AND NAILS FORMULA PO) Take by mouth.   Yes [provider]  pantoprazole (PROTONIX) 40 MG tablet Take 1 tablet (40 mg total) by mouth daily. 07/10/21  Yes Sheliah Hatchabori, Katherine E, MD  phentermine 37.5 MG capsule Take by mouth. 02/14/16  Yes [provider]  pravastatin (PRAVACHOL) 40 MG tablet TAKE 1 TABLET BY MOUTH DAILY 05/16/22  Yes Sheliah Hatchabori, Katherine E, MD  tiZANidine (ZANAFLEX) 4 MG tablet Take 1 tablet (4 mg total) by mouth every 8 (eight) hours as needed for muscle spasms. 12/23/21  Yes Sheliah Hatchabori, Katherine E, MD  cephALEXin (KEFLEX) 500 MG capsule Take 1 capsule (500 mg total) by mouth 3 (three) times daily. Patient not taking: Reported on 04/30/2022 02/10/22   Vivi BarrackWagoner, Matthew R, DPM  ondansetron (ZOFRAN) 4 MG tablet Take 1 tablet (4 mg total) by mouth every 8 (eight) hours as needed for nausea or vomiting. Patient not taking: Reported on 08/01/2022 04/30/22   Sheliah Hatchabori, Katherine E, MD  lisinopril-hydrochlorothiazide (PRINZIDE,ZESTORETIC) 10-12.5 MG per tablet Take 1 tablet by mouth daily. 08/05/10 07/21/11  Sheliah Hatchabori, Katherine E, MD   Social History   Socioeconomic History   Marital status: Widowed    Spouse name: Not on file   Number of children: Not on file   Years of education: Not on file   Highest education level: Not on file  Occupational History   Occupation: Retired  Tobacco Use   Smoking status: Never   Smokeless tobacco: Never  Vaping Use   Vaping Use: Never used  Substance and Sexual Activity   Alcohol use: Yes    Comment: rarely   Drug use: No   Sexual activity: Yes    Partners: Male  Other Topics Concern   Not on file  Social History Narrative   Not on file   Social Determinants of Health   Financial Resource Strain: Low Risk  (11/05/2020)   Overall Financial Resource Strain (CARDIA)    Difficulty of Paying Living Expenses: Not hard at all  Food Insecurity: No Food Insecurity (11/05/2020)   Hunger Vital  Sign    Worried About Running Out of Food in the Last Year: Never true    Ran Out of Food in the Last Year: Never true  Transportation Needs: No Transportation Needs (11/05/2020)   PRAPARE - Administrator, Civil ServiceTransportation    Lack of Transportation (Medical): No    Lack of Transportation (Non-Medical): No  Physical Activity: Insufficiently Active (11/05/2020)   Exercise Vital Sign    Days of Exercise per Week: 4 days    Minutes of Exercise per Session: 10 min  Stress: No Stress Concern Present (11/05/2020)   [employer] - Occupational Stress Questionnaire    Feeling of Stress : Not at all  Social Connections: Moderately Integrated (11/05/2020)   Social Connection and Isolation Panel [NHANES]    Frequency of Communication with  Friends and Family: More than three times a week    Frequency of Social Gatherings with Friends and Family: Three times a week    Attends Religious Services: 1 to 4 times per year    Active Member of Clubs or Organizations: Yes    Attends Banker Meetings: More than 4 times per year    Marital Status: Widowed  Intimate Partner Violence: Not At Risk (10/24/2019)   Humiliation, Afraid, Rape, and Kick questionnaire    Fear of Current or Ex-Partner: No    Emotionally Abused: No    Physically Abused: No    Sexually Abused: No    Observations/Objective: Today's Vitals   08/01/22 0817  BP: 127/70  Pulse: 70  Weight: 264 lb (119.7 kg)  Height: 5\' 8"  (1.727 m)   Physical Exam HENT:     Head:     Comments: Sounded hoarse.  Pulmonary:     Comments: Able to speak in full sentences. Coughing during visit.  Neurological:     Mental Status: She is alert and oriented to person, place, and time.  Psychiatric:        Speech: Speech normal.    Assessment and Plan: COVID -     nirmatrelvir/ritonavir; Take 3 tablets by mouth 2 (two) times daily for 5 days. (Take nirmatrelvir 150 mg two tablets twice daily for 5 days and ritonavir 100 mg one  tablet twice daily for 5 days) Patient GFR is 70.  Dispense: 30 tablet; Refill: 0 -     Benzonatate; Take 1 capsule (200 mg total) by mouth 3 (three) times daily as needed for up to 10 days for cough.  Dispense: 20 capsule; Refill: 0  1.Prescribed Paxlovid for 5 days. Recommend to stop taking Pravastatin for 14 days since their is concern about liver complications. 2.Prescribed Benzonatate capsules for 10 days as needed for cough. 3.Recommend to stay hydrates, 64-100oz of fluid a day, preferably water. 4.If she developed chest pain or shortness of breath, she needs to go directly to an emergency department.  5.Alternate Tylenol 1000mg  and Ibuprofen 600-800mg  every 4 hours for pain, fever, headache, and body aches. Recommend to eat something when taking Ibuprofen.  6.Discussed about CDC guidelines for covid pertaining to isolation and wearing mask.    Follow Up Instructions: No follow-ups on file.   I discussed the assessment and treatment plan with the patient. The patient was provided an opportunity to ask questions and all were answered. The patient agreed with the plan and demonstrated an understanding of the instructions.   The patient was advised to call back or seek an in-person evaluation if the symptoms worsen or if the condition fails to improve as anticipated.  Zandra Abts, NP

## 2022-08-06 ENCOUNTER — Telehealth: Payer: Self-pay

## 2022-08-06 NOTE — Telephone Encounter (Signed)
[facility] sent a medical clearance form for pt . Surgery is may 1,2024. Form placed in Dr Beverely Low to b signed folder . Last OV was a video visit 08/01/22

## 2022-08-14 ENCOUNTER — Telehealth: Payer: Self-pay | Admitting: Family Medicine

## 2022-08-14 NOTE — Telephone Encounter (Signed)
Called patient to schedule Medicare Annual Wellness Visit (AWV). Left message for patient to call back and schedule Medicare Annual Wellness Visit (AWV).  Last date of AWV: 11/05/2020   Please schedule an AWVS appointment at any time with [employer] SV ANNUAL WELLNESS VISIT.  If any questions, please contact me at [phone].   Thank you,  [facility] Support [facility] Direct dial  [phone]

## 2022-08-19 NOTE — Telephone Encounter (Signed)
[facility] is calling asking about this medical clearance. Update -advise

## 2022-08-20 NOTE — Telephone Encounter (Signed)
Pt has been scheduled.  °

## 2022-08-20 NOTE — Telephone Encounter (Signed)
Pt needs nurse visit for EKG to complete surgical clearance process

## 2022-08-21 ENCOUNTER — Encounter: Payer: Self-pay | Admitting: Family Medicine

## 2022-08-21 ENCOUNTER — Ambulatory Visit (INDEPENDENT_AMBULATORY_CARE_PROVIDER_SITE_OTHER): Payer: Medicare Other | Admitting: Family Medicine

## 2022-08-21 VITALS — BP 138/80 | HR 81 | Temp 98.4°F | Resp 18 | Ht 68.0 in | Wt 264.5 lb

## 2022-08-21 DIAGNOSIS — L989 Disorder of the skin and subcutaneous tissue, unspecified: Secondary | ICD-10-CM

## 2022-08-21 DIAGNOSIS — I10 Essential (primary) hypertension: Secondary | ICD-10-CM | POA: Diagnosis not present

## 2022-08-21 DIAGNOSIS — E785 Hyperlipidemia, unspecified: Secondary | ICD-10-CM

## 2022-08-21 DIAGNOSIS — Z01818 Encounter for other preprocedural examination: Secondary | ICD-10-CM | POA: Diagnosis not present

## 2022-08-21 LAB — LIPID PANEL
Cholesterol: 133 mg/dL (ref 0–200)
HDL: 48 mg/dL (ref >39.00)
LDL Cholesterol: 63 mg/dL (ref 0–99)
NonHDL: 85.24
Total CHOL/HDL Ratio: 3
Triglycerides: 113 mg/dL (ref 0.0–149.0)
VLDL: 22.6 mg/dL (ref 0.0–40.0)

## 2022-08-21 LAB — CBC WITH DIFFERENTIAL/PLATELET
Basophils Absolute: 0 10*3/uL (ref 0.0–0.1)
Basophils Relative: 0.7 % (ref 0.0–3.0)
Eosinophils Absolute: 0.1 10*3/uL (ref 0.0–0.7)
Eosinophils Relative: 2.3 % (ref 0.0–5.0)
HCT: 40 % (ref 36.0–46.0)
Hemoglobin: 14 g/dL (ref 12.0–15.0)
Lymphocytes Relative: 31.2 % (ref 12.0–46.0)
Lymphs Abs: 1.2 10*3/uL (ref 0.7–4.0)
MCHC: 34.9 g/dL (ref 30.0–36.0)
MCV: 96.9 fl (ref 78.0–100.0)
Monocytes Absolute: 0.4 10*3/uL (ref 0.1–1.0)
Monocytes Relative: 9.6 % (ref 3.0–12.0)
Neutro Abs: 2.2 10*3/uL (ref 1.4–7.7)
Neutrophils Relative %: 56.2 % (ref 43.0–77.0)
Platelets: 282 10*3/uL (ref 150.0–400.0)
RBC: 4.13 Mil/uL (ref 3.87–5.11)
RDW: 13.5 % (ref 11.5–15.5)
WBC: 3.8 10*3/uL — ABNORMAL LOW (ref 4.0–10.5)

## 2022-08-21 LAB — HEPATIC FUNCTION PANEL
ALT: 17 U/L (ref 0–35)
AST: 21 U/L (ref 0–37)
Albumin: 4.2 g/dL (ref 3.5–5.2)
Alkaline Phosphatase: 74 U/L (ref 39–117)
Bilirubin, Direct: 0.2 mg/dL (ref 0.0–0.3)
Total Bilirubin: 1 mg/dL (ref 0.2–1.2)
Total Protein: 7.3 g/dL (ref 6.0–8.3)

## 2022-08-21 LAB — BASIC METABOLIC PANEL
BUN: 15 mg/dL (ref 6–23)
CO2: 29 mEq/L (ref 19–32)
Calcium: 10.3 mg/dL (ref 8.4–10.5)
Chloride: 102 mEq/L (ref 96–112)
Creatinine, Ser: 0.77 mg/dL (ref 0.40–1.20)
GFR: 74.07 mL/min (ref >60.00)
Glucose, Bld: 93 mg/dL (ref 70–99)
Potassium: 3.7 mEq/L (ref 3.5–5.1)
Sodium: 137 mEq/L (ref 135–145)

## 2022-08-21 LAB — TSH: TSH: 1.73 u[IU]/mL (ref 0.35–5.50)

## 2022-08-21 NOTE — Patient Instructions (Addendum)
Schedule your complete physical in 6 months We'll notify you of your lab results and make any changes if needed We'll call you to schedule your dermatology appt Continue to work on low carb, low sugar diet and exercise as you are able- you can do it! Call with any questions or concerns Stay Safe!  Stay Healthy! GOOD LUCK WITH SURGERY!!!

## 2022-08-21 NOTE — Progress Notes (Signed)
   Subjective:    Patient ID: Shelby Reeves, female    DOB: 09/16/1945, 77 y.o.   MRN: [id number]  HPI Surgical clearance- pt has bilateral upper eyelid blepharoplasty and blepharoptosis repair scheduled at [facility] on 08/27/22.  Currently on ASA which she will need to stop prior to procedure.  Has already stopped.  No hx of difficulty w/ anesthesia.  No chipped, broken, or loose teeth.  No partials.  HTN- chronic problem, on Losartan 50mg  daily and HCTZ 12.5mg  daily w/ adequate control.  No CP, SOB, HA's, visual changes, edema.  Hyperlipidemia- chronic problem, on Pravastatin 40mg  daily.  No abd pain, N/V.  Obesity- pt has gained 6 lbs since last visit.  BMI 40.22  Review of Systems For ROS see HPI     Objective:   Physical Exam Vitals reviewed.  Constitutional:      General: She is not in acute distress.    Appearance: Normal appearance. She is well-developed. She is obese. She is not ill-appearing.  HENT:     Head: Normocephalic and atraumatic.  Eyes:     Conjunctiva/sclera: Conjunctivae normal.     Pupils: Pupils are equal, round, and reactive to light.  Neck:     Thyroid: No thyromegaly.  Cardiovascular:     Rate and Rhythm: Normal rate and regular rhythm.     Pulses: Normal pulses.     Heart sounds: Normal heart sounds. No murmur heard. Pulmonary:     Effort: Pulmonary effort is normal. No respiratory distress.     Breath sounds: Normal breath sounds.  Abdominal:     General: There is no distension.     Palpations: Abdomen is soft.     Tenderness: There is no abdominal tenderness.  Musculoskeletal:     Cervical back: Normal range of motion and neck supple.     Right lower leg: No edema.     Left lower leg: No edema.  Lymphadenopathy:     Cervical: No cervical adenopathy.  Skin:    General: Skin is warm and dry.  Neurological:     General: No focal deficit present.     Mental Status: She is alert and oriented to person, place, and time.  Psychiatric:         Mood and Affect: Mood normal.        Behavior: Behavior normal.        Thought Content: Thought content normal.           Assessment & Plan:

## 2022-08-21 NOTE — Telephone Encounter (Signed)
Pt surgical clearance form has been faxed and placed in blue folder to the left of my lap top

## 2022-08-26 NOTE — Assessment & Plan Note (Signed)
EKG looks good, she has stopped her ASA.  Pending lab results, she is cleared for surgery.  Will fax forms.

## 2022-08-26 NOTE — Assessment & Plan Note (Signed)
Deteriorated.  Pt has gained 6 lbs since last visit.  BMI now 40.22  Stressed need for low carb diet and regular physical activity.  Will follow.

## 2022-08-26 NOTE — Assessment & Plan Note (Signed)
Chronic problem, on Losartan 50mg  daily and HCTZ 12.5mg  daily w/o difficulty.  Currently asymptomatic.  Check labs due to ARB and diuretic use but no anticipated med changes.

## 2022-08-26 NOTE — Assessment & Plan Note (Signed)
Chronic problem, currently on Pravastatin 40mg  daily w/o difficulty.  Check labs.  Adjust meds prn

## 2022-08-27 DIAGNOSIS — H53483 Generalized contraction of visual field, bilateral: Secondary | ICD-10-CM | POA: Diagnosis not present

## 2022-08-27 DIAGNOSIS — H02423 Myogenic ptosis of bilateral eyelids: Secondary | ICD-10-CM | POA: Diagnosis not present

## 2022-09-12 ENCOUNTER — Ambulatory Visit (INDEPENDENT_AMBULATORY_CARE_PROVIDER_SITE_OTHER): Payer: Medicare Other | Admitting: Family Medicine

## 2022-09-12 VITALS — BP 130/82 | HR 67 | Temp 98.9°F | Resp 17 | Ht 68.0 in | Wt 264.0 lb

## 2022-09-12 DIAGNOSIS — K5792 Diverticulitis of intestine, part unspecified, without perforation or abscess without bleeding: Secondary | ICD-10-CM

## 2022-09-12 DIAGNOSIS — K649 Unspecified hemorrhoids: Secondary | ICD-10-CM

## 2022-09-12 MED ORDER — AMOXICILLIN-POT CLAVULANATE 875-125 MG PO TABS: 1.0000 | ORAL_TABLET | Freq: Two times a day (BID) | ORAL | 0 refills | Status: AC

## 2022-09-12 MED ORDER — PANTOPRAZOLE SODIUM 40 MG PO TBEC: 40.0000 mg | DELAYED_RELEASE_TABLET | Freq: Every day | ORAL | 1 refills | Status: DC

## 2022-09-12 NOTE — Progress Notes (Signed)
   Subjective:    Patient ID: Shelby Reeves, female    DOB: 13-Dec-1945, 77 y.o.   MRN: [id number]  HPI BRBPR- 'i must have some intestinal infection going on'.  Pt reports feeling bloated but when she feels she needs to use the bathroom 'there's nothing'.  A few days ago had blood on toilet tissue.  Pt has to strain for [facility] but this is 'normal'.  Pt was constipated for a few days last week but this improved w/ Miralax.  Denies abd pain.  No N/V.  Pt took Amoxicillin last night and again this morning.  Pt reports no hx of similar.     Review of Systems For ROS see HPI     Objective:   Physical Exam Vitals reviewed. Exam conducted with a chaperone present.  Constitutional:      General: She is not in acute distress.    Appearance: Normal appearance. She is not ill-appearing.  Abdominal:     General: Bowel sounds are normal. There is distension (mild).     Palpations: Abdomen is soft.     Tenderness: There is abdominal tenderness (mild TTP over LLQ). There is no guarding or rebound.  Genitourinary:    Rectum: External hemorrhoid present. No mass, tenderness or anal fissure. Normal anal tone.  Skin:    General: Skin is warm and dry.  Neurological:     General: No focal deficit present.     Mental Status: She is alert and oriented to person, place, and time.  Psychiatric:        Mood and Affect: Mood normal.        Behavior: Behavior normal.        Thought Content: Thought content normal.           Assessment & Plan:   Diverticulitis- new.  Pt has known diverticula as seen on colonoscopy done 2019.  Had a bout of constipation last week that improved w/ Miralax but subsequently developed pain, bloating, gas.  3 days ago noted BRBPR on toilet tissue- none in bowl or on pad.  Start Augmentin for likely diverticulitis and suspect her bleeding is due to the hemorrhoid on physical exam.  If no improvement w/ abx, will need GI referral.  Pt expressed understanding and is in agreement w/  plan.

## 2022-09-12 NOTE — Patient Instructions (Addendum)
Start the Amoxicillin Clavulanate (Augmentin) twice daily- take w/ food Drink LOTS of fluids I think the bleeding is from a hemorrhoid.  Take Miralax as needed to keep stools soft and moving (or a stool softener like Colace) Stick to a bland diet until things are improving Call with any questions or concerns- particularly if your bleeding doesn't improve Hang in there!!!

## 2022-09-16 ENCOUNTER — Telehealth: Payer: Self-pay | Admitting: Pharmacist

## 2022-09-16 NOTE — Progress Notes (Signed)
Care Management & Coordination Services Pharmacy Team   Reason for Encounter: Appointment Reminder   Contacted patient to confirm telephone appointment with Erskine Emery, PharmD on 09/17/22 at [facility]. Unsuccessful outreach. Left voicemail for patient to return call.    Care Gaps: Annual wellness visit in last year? Yes done 07/30/22   If Diabetic: N/A Last eye exam / retinopathy screening: Last diabetic foot exam:   Future Appointments  Date Time Provider Department Center  09/17/2022  2:00 PM Erroll Luna, [facility] None  02/20/2023  2:00 PM Sheliah Hatch, MD [facility]    [facility] Concierge, [address]

## 2022-09-17 ENCOUNTER — Encounter: Payer: Medicare Other | Admitting: Pharmacist

## 2022-09-17 ENCOUNTER — Telehealth: Payer: Medicare Other

## 2022-09-17 NOTE — Progress Notes (Unsigned)
Care Management & Coordination Services Pharmacy Note  09/17/2022 Name:  Shelby Reeves MRN:  [id number] DOB:  [birth year]  Summary: ***  Recommendations/Changes made from today's visit: ***  Follow up plan: ***   Subjective: Shelby Reeves is an 77 y.o. year old female who is a primary patient of Tabori, Helane Rima, MD.  The care coordination team was consulted for assistance with disease management and care coordination needs.    {CCMTELEPHONEFACETOFACE:21091510} for {CCMINITIALFOLLOWUPCHOICE:21091511}.  Recent office visits: ***  Recent consult visits: ***  Hospital visits: {Hospital DC Yes/No:25215}   Objective:  Lab Results  Component Value Date   CREATININE 0.77 08/21/2022   BUN 15 08/21/2022   GFR 74.07 08/21/2022   GFRNONAA >60 07/29/2021   NA 137 08/21/2022   K 3.7 08/21/2022   CALCIUM 10.3 08/21/2022   CO2 29 08/21/2022   GLUCOSE 93 08/21/2022    Lab Results  Component Value Date/Time   HGBA1C 6.1 07/12/2021 05:12 PM   HGBA1C 6.0 02/14/2016 11:01 AM   GFR 74.07 08/21/2022 01:26 PM   GFR 70.00 01/10/2022 11:16 AM    Last diabetic Eye exam:  Lab Results  Component Value Date/Time   HMDIABEYEEXA No Retinopathy 09/02/2021 12:00 AM    Last diabetic Foot exam: No results found for: "HMDIABFOOTEX"   Lab Results  Component Value Date   CHOL 133 08/21/2022   HDL 48.00 08/21/2022   LDLCALC 63 08/21/2022   TRIG 113.0 08/21/2022   CHOLHDL 3 08/21/2022       Latest Ref Rng & Units 08/21/2022    1:26 PM 01/10/2022   11:16 AM 07/10/2021    9:35 AM  Hepatic Function  Total Protein 6.0 - 8.3 g/dL 7.3  7.3  6.8   Albumin 3.5 - 5.2 g/dL 4.2  4.1  4.5   AST 0 - 37 U/L 21  21  22    ALT 0 - 35 U/L 17  19  23    Alk Phosphatase 39 - 117 U/L 74  74  68   Total Bilirubin 0.2 - 1.2 mg/dL 1.0  1.5  1.3   Bilirubin, Direct 0.0 - 0.3 mg/dL 0.2  0.3  0.3     Lab Results  Component Value Date/Time   TSH 1.73 08/21/2022 01:26 PM   TSH 2.41 01/10/2022 11:16  AM       Latest Ref Rng & Units 08/21/2022    1:26 PM 01/10/2022   11:16 AM 07/29/2021   10:03 AM  CBC  WBC 4.0 - 10.5 K/uL 3.8  3.6  2.9   Hemoglobin 12.0 - 15.0 g/dL 40.9  81.1  91.4   Hematocrit 36.0 - 46.0 % 40.0  40.9  42.1   Platelets 150.0 - 400.0 K/uL 282.0  228.0  244     Lab Results  Component Value Date/Time   VD25OH 28.36 (L) 07/10/2021 09:35 AM    Clinical ASCVD: {YES/NO:21197} The 10-year ASCVD risk score (Arnett DK, et al., 2019) is: 28.8%   Values used to calculate the score:     Age: 36 years     Sex: Female     Is Non-Hispanic African American: No     Diabetic: No     Tobacco smoker: No     Systolic Blood Pressure: 130 mmHg     Is BP treated: Yes     HDL Cholesterol: 48 mg/dL     Total Cholesterol: 133 mg/dL    ***Other: ([id number] if Afib, MMRC or CAT for COPD, ACT, DEXA)  09/12/2022    1:43 PM 08/21/2022   12:54 PM 04/30/2022   11:28 AM  Depression screen PHQ 2/9  Decreased Interest 0 0 0  Down, Depressed, Hopeless 0 0 0  PHQ - 2 Score 0 0 0  Altered sleeping 0 0 0  Tired, decreased energy 0 1 1  Change in appetite 0 0 0  Feeling bad or failure about yourself  0 0 0  Trouble concentrating  0 0  Moving slowly or fidgety/restless 0 0 0  Suicidal thoughts 0 0 0  PHQ-9 Score 0 1 1  Difficult doing work/chores Not difficult at all Not difficult at all Not difficult at all     Social History   Tobacco Use  Smoking Status Never  Smokeless Tobacco Never   BP Readings from Last 3 Encounters:  09/12/22 130/82  08/21/22 138/80  08/01/22 127/70   Pulse Readings from Last 3 Encounters:  09/12/22 67  08/21/22 81  08/01/22 70   Wt Readings from Last 3 Encounters:  09/12/22 264 lb (119.7 kg)  08/21/22 264 lb 8 oz (120 kg)  08/01/22 264 lb (119.7 kg)   BMI Readings from Last 3 Encounters:  09/12/22 40.14 kg/m  08/21/22 40.22 kg/m  08/01/22 40.14 kg/m    Allergies  Allergen Reactions   Dust Mite Extract Cough   Iodine Hives    Shellfish Allergy Hives    Medications Reviewed Today     Reviewed by Sheliah Hatch, MD (Physician) on 09/12/22 at 1403  Med List Status: <None>   Medication Order Taking? Sig Documenting Provider Last Dose Status Informant  aspirin 81 MG tablet [id number] Yes Take 81 mg by mouth daily. [provider] Taking Active   Aspirin-Calcium Carbonate Ramond Dial WOMENS) [phone] MG TABS [id number] Yes Take by mouth. [provider] Taking Active   Calcium Citrate-Vitamin D (CAL-CITRATE PLUS VITAMIN D) 250-2.5 MG-MCG TABS [id number] Yes Take by mouth. [provider] Taking Active   hydrochlorothiazide (HYDRODIURIL) 12.5 MG tablet [id number] Yes TAKE 1 TO 2 TABLETS BY MOUTH EVERY DAY DEPENDING ON SWELLING Sheliah Hatch, MD Taking Active     Discontinued 07/21/11 1338 (Side effect (s))   losartan (COZAAR) 50 MG tablet [id number] Yes Take 2 tablets (100 mg total) by mouth daily. Sheliah Hatch, MD Taking Active   Multiple Vitamin (MULTIVITAMIN) tablet [id number] Yes Take 1 tablet by mouth daily. [provider] Taking Active   Multiple Vitamins-Minerals (HAIR SKIN AND NAILS FORMULA PO) [id number] Yes Take by mouth. [provider] Taking Active   pantoprazole (PROTONIX) 40 MG tablet [id number]  Take 1 tablet (40 mg total) by mouth daily. Sheliah Hatch, MD  Active   pravastatin (PRAVACHOL) 40 MG tablet [id number] Yes TAKE 1 TABLET BY MOUTH DAILY Sheliah Hatch, MD Taking Active             SDOH:  (Social Determinants of Health) assessments and interventions performed: {yes/no:20286} SDOH Interventions    Flowsheet Row Clinical Support from 11/05/2020 in [facility]  SDOH Interventions   Food Insecurity Interventions Intervention Not Indicated  Housing Interventions Intervention Not Indicated  Transportation Interventions Intervention Not Indicated  Financial Strain Interventions Intervention Not  Indicated  Physical Activity Interventions Intervention Not Indicated  Stress Interventions Intervention Not Indicated  Social Connections Interventions Intervention Not Indicated       Medication Assistance: {MEDASSISTANCEINFO:25044}  Medication Access: Name and location of current pharmacy:  [id number] [place], [place] -  [address] AT [address] [place] [address] Phone: [phone] Fax: [phone]  Your Rx - [place], [place] - [address] [place] [address] [place] [address] Phone: [phone] Fax: [phone]  Within the past 30 days, how often has patient missed a dose of medication? *** Is a pillbox or other method used to improve adherence? {YES/NO:21197} Factors that may affect medication adherence? {CHL DESC; BARRIERS:21522} Are meds synced by current pharmacy? {YES/NO:21197} Are meds delivered by current pharmacy? {YES/NO:21197} Does patient experience delays in picking up medications due to transportation concerns? {YES/NO:21197}  Compliance/Adherence/Medication fill history: Care Gaps: ***  Star-Rating Drugs: ***   Assessment/Plan      Hypertension (BP goal <130/80) -Controlled -Hypotension Hx -Current treatment: Losartan 100 mg once daily Appropriate, Effective, Safe, Accessible HCTZ 12.5 mg - 1-2 tablets once daily Appropriate, Effective, Safe, Accessible -Current home readings: at goal historically, has not taken recent but has cuff.  -Current dietary habits: following lower carb diet, avoids bread. -Current exercise habits: doing best to stay active/on feet, some walking. Hoping to make use of stationary bike at home and get in habit of riding every day. Down 20 lb since last year per patient. -Denies recent hypotensive/hypertensive symptoms, no recent falls -Exercise goal of 150 minutes per week -No issues with compliance/no fill gaps noted -Recommended to continue current  medication  Update 09/18/21 Home BP - 125-126/78. She is tolerating increased dose of Losartan to 100mg  well.  She denies any dizziness or HA.  She has not recorded BP or checked today. BP was elevated after taking phentermine.  She d/c medication and had continued elevated BP.  No issues since most recent dose change. No changes - continue to monitor. Will fu in one year but have CMA check in periodically on BP to ensure adequate monitoring at home.   Hyperlipidemia: (LDL goal < 70-100) -Controlled -Primary prevention, HLD and HTN well controlled. On asa 81 mg. -Current treatment: Pravastatin 40 mg once daily  Appropriate, Effective, Safe, Accessible -Tolerating without side effects, no issues with compliance -Educated on Cholesterol goals -Recommended to continue current medication  Update 09/18/21 She is taking medication in the morning.  Taking at night may be the best way, however, this causes her to miss doses.  I would rather her take it daily and have 100% adherence rather than stress about timing.  Would refer to most recent lipid panel showing adequate control and make no changes to recommendations for medication timing. Tolerating well Continue routine screening.  Acid Reflux (Goal: minimize symptoms) -Controlled -Reports significant improvement in reflux symptoms after losing weight -Current treatment  Pantoprazole 40 mg once daily -Recommended to continue current medication,           Willa Frater, PharmD Clinical Pharmacist  [facility] [phone]

## 2022-09-19 ENCOUNTER — Telehealth: Payer: Self-pay | Admitting: Family Medicine

## 2022-09-19 ENCOUNTER — Other Ambulatory Visit: Payer: Self-pay

## 2022-09-19 DIAGNOSIS — I10 Essential (primary) hypertension: Secondary | ICD-10-CM

## 2022-09-19 MED ORDER — LOSARTAN POTASSIUM 50 MG PO TABS
100.0000 mg | ORAL_TABLET | Freq: Every day | ORAL | 1 refills | Status: DC
Start: 2022-09-19 — End: 2022-12-16

## 2022-09-19 NOTE — Telephone Encounter (Signed)
Refill sent in

## 2022-09-19 NOTE — Telephone Encounter (Signed)
Encourage patient to contact the pharmacy for refills or they can request refills through [facility]   WHAT PHARMACY WOULD THEY LIKE THIS SENT TO:  [id number] - [place], [place] - [address] AT [facility] [place]   MEDICATION NAME & DOSE: losartan (COZAAR) 50 MG tablet  NOTES/COMMENTS FROM PATIENT:      Front office please notify patient: It takes 48-72 hours to process rx refill requests Ask patient to call pharmacy to ensure rx is ready before heading there.

## 2022-10-01 ENCOUNTER — Ambulatory Visit (INDEPENDENT_AMBULATORY_CARE_PROVIDER_SITE_OTHER): Payer: Medicare Other | Admitting: *Deleted

## 2022-10-01 DIAGNOSIS — Z0000 Encounter for general adult medical examination without abnormal findings: Secondary | ICD-10-CM

## 2022-10-01 NOTE — Patient Instructions (Signed)
[name] , Thank you for taking time to come for your Medicare Wellness Visit. I appreciate your ongoing commitment to your health goals. Please review the following plan we discussed and let me know if I can assist you in the future.   Screening recommendations/referrals: Colonoscopy: up to date ( du in December) Mammogram: up to date Bone Density: up to date Recommended yearly ophthalmology/optometry visit for glaucoma screening and checkup Recommended yearly dental visit for hygiene and checkup  Vaccinations: Influenza vaccine: up to date Pneumococcal vaccine: up to date Tdap vaccine: up to date Shingles vaccine: up to date    Advanced directives: yes     Preventive Care 65 Years and Older, Female Preventive care refers to lifestyle choices and visits with your health care provider that can promote health and wellness. What does preventive care include? A yearly physical exam. This is also called an annual well check. Dental exams once or twice a year. Routine eye exams. Ask your health care provider how often you should have your eyes checked. Personal lifestyle choices, including: Daily care of your teeth and gums. Regular physical activity. Eating a healthy diet. Avoiding tobacco and drug use. Limiting alcohol use. Practicing safe sex. Taking low-dose aspirin every day. Taking vitamin and mineral supplements as recommended by your health care provider. What happens during an annual well check? The services and screenings done by your health care provider during your annual well check will depend on your age, overall health, lifestyle risk factors, and family history of disease. Counseling  Your health care provider may ask you questions about your: Alcohol use. Tobacco use. Drug use. Emotional well-being. Home and relationship well-being. Sexual activity. Eating habits. History of falls. Memory and ability to understand (cognition). Work and work  Astronomer. Reproductive health. Screening  You may have the following tests or measurements: Height, weight, and BMI. Blood pressure. Lipid and cholesterol levels. These may be checked every 5 years, or more frequently if you are over 56 years old. Skin check. Lung cancer screening. You may have this screening every year starting at age 69 if you have a 30-pack-year history of smoking and currently smoke or have quit within the past 15 years. Fecal occult blood test (FOBT) of the stool. You may have this test every year starting at age 4. Flexible sigmoidoscopy or colonoscopy. You may have a sigmoidoscopy every 5 years or a colonoscopy every 10 years starting at age 64. Hepatitis C blood test. Hepatitis B blood test. Sexually transmitted disease (STD) testing. Diabetes screening. This is done by checking your blood sugar (glucose) after you have not eaten for a while (fasting). You may have this done every 1-3 years. Bone density scan. This is done to screen for osteoporosis. You may have this done starting at age 39. Mammogram. This may be done every 1-2 years. Talk to your health care provider about how often you should have regular mammograms. Talk with your health care provider about your test results, treatment options, and if necessary, the need for more tests. Vaccines  Your health care provider may recommend certain vaccines, such as: Influenza vaccine. This is recommended every year. Tetanus, diphtheria, and acellular pertussis (Tdap, Td) vaccine. You may need a Td booster every 10 years. Zoster vaccine. You may need this after age 39. Pneumococcal 13-valent conjugate (PCV13) vaccine. One dose is recommended after age 19. Pneumococcal polysaccharide (PPSV23) vaccine. One dose is recommended after age 89. Talk to your health care provider about which screenings and vaccines  you need and how often you need them. This information is not intended to replace advice given to you by  your health care provider. Make sure you discuss any questions you have with your health care provider. Document Released: 05/11/2015 Document Revised: 01/02/2016 Document Reviewed: 02/13/2015 Elsevier Interactive Patient Education  2017 [employer].  Fall Prevention in the Home Falls can cause injuries. They can happen to people of all ages. There are many things you can do to make your home safe and to help prevent falls. What can I do on the outside of my home? Regularly fix the edges of walkways and driveways and fix any cracks. Remove anything that might make you trip as you walk through a door, such as a raised step or threshold. Trim any bushes or trees on the path to your home. Use bright outdoor lighting. Clear any walking paths of anything that might make someone trip, such as rocks or tools. Regularly check to see if handrails are loose or broken. Make sure that both sides of any steps have handrails. Any raised decks and porches should have guardrails on the edges. Have any leaves, snow, or ice cleared regularly. Use sand or salt on walking paths during winter. Clean up any spills in your garage right away. This includes oil or grease spills. What can I do in the bathroom? Use night lights. Install grab bars by the toilet and in the tub and shower. Do not use towel bars as grab bars. Use non-skid mats or decals in the tub or shower. If you need to sit down in the shower, use a plastic, non-slip stool. Keep the floor dry. Clean up any water that spills on the floor as soon as it happens. Remove soap buildup in the tub or shower regularly. Attach bath mats securely with double-sided non-slip rug tape. Do not have throw rugs and other things on the floor that can make you trip. What can I do in the bedroom? Use night lights. Make sure that you have a light by your bed that is easy to reach. Do not use any sheets or blankets that are too big for your bed. They should not hang  down onto the floor. Have a firm chair that has side arms. You can use this for support while you get dressed. Do not have throw rugs and other things on the floor that can make you trip. What can I do in the kitchen? Clean up any spills right away. Avoid walking on wet floors. Keep items that you use a lot in easy-to-reach places. If you need to reach something above you, use a strong step stool that has a grab bar. Keep electrical cords out of the way. Do not use floor polish or wax that makes floors slippery. If you must use wax, use non-skid floor wax. Do not have throw rugs and other things on the floor that can make you trip. What can I do with my stairs? Do not leave any items on the stairs. Make sure that there are handrails on both sides of the stairs and use them. Fix handrails that are broken or loose. Make sure that handrails are as long as the stairways. Check any carpeting to make sure that it is firmly attached to the stairs. Fix any carpet that is loose or worn. Avoid having throw rugs at the top or bottom of the stairs. If you do have throw rugs, attach them to the floor with carpet tape. Make sure  that you have a light switch at the top of the stairs and the bottom of the stairs. If you do not have them, ask someone to add them for you. What else can I do to help prevent falls? Wear shoes that: Do not have high heels. Have rubber bottoms. Are comfortable and fit you well. Are closed at the toe. Do not wear sandals. If you use a stepladder: Make sure that it is fully opened. Do not climb a closed stepladder. Make sure that both sides of the stepladder are locked into place. Ask someone to hold it for you, if possible. Clearly mark and make sure that you can see: Any grab bars or handrails. First and last steps. Where the edge of each step is. Use tools that help you move around (mobility aids) if they are needed. These  include: Canes. Walkers. Scooters. Crutches. Turn on the lights when you go into a dark area. Replace any light bulbs as soon as they burn out. Set up your furniture so you have a clear path. Avoid moving your furniture around. If any of your floors are uneven, fix them. If there are any pets around you, be aware of where they are. Review your medicines with your doctor. Some medicines can make you feel dizzy. This can increase your chance of falling. Ask your doctor what other things that you can do to help prevent falls. This information is not intended to replace advice given to you by your health care provider. Make sure you discuss any questions you have with your health care provider. Document Released: 02/08/2009 Document Revised: 09/20/2015 Document Reviewed: 05/19/2014 Elsevier Interactive Patient Education  2017 [employer].

## 2022-10-01 NOTE — Progress Notes (Signed)
Subjective:   Shelby Reeves is a 77 y.o. female who presents for Medicare Annual (Subsequent) preventive examination.  I connected with  Blair Heys on 10/01/22 by a telephone enabled telemedicine application and verified that I am speaking with the correct person using two identifiers.   I discussed the limitations of evaluation and management by telemedicine. The patient expressed understanding and agreed to proceed.  Patient location: home  Provider location: telephone/home    Review of Systems     Cardiac Risk Factors include: advanced age (>26men, >15 women);hypertension;family history of premature cardiovascular disease     Objective:    Today's Vitals   There is no height or weight on file to calculate BMI.     10/01/2022    4:03 PM 01/07/2022    3:49 PM 11/05/2020   11:26 AM 10/24/2019    3:42 PM 10/06/2018   10:09 AM 02/18/2017    9:34 AM  Advanced Directives  Does Patient Have a Medical Advance Directive? Yes Yes Yes Yes Yes Yes  Type of Advance Directive Living will  Healthcare Power of [place];Living will Healthcare Power of [place];Living will Healthcare Power of [place];Living will Living will;Healthcare Power of Attorney  Does patient want to make changes to medical advance directive?  No - Patient declined      Copy of Healthcare Power of Attorney in Chart?   No - copy requested No - copy requested No - copy requested No - copy requested    Current Medications (verified) Outpatient Encounter Medications as of 10/01/2022  Medication Sig   aspirin 81 MG tablet Take 81 mg by mouth daily.   Aspirin-Calcium Carbonate (BAYER WOMENS) [phone] MG TABS Take by mouth.   Calcium Citrate-Vitamin D (CAL-CITRATE PLUS VITAMIN D) 250-2.5 MG-MCG TABS Take by mouth.   hydrochlorothiazide (HYDRODIURIL) 12.5 MG tablet TAKE 1 TO 2 TABLETS BY MOUTH EVERY DAY DEPENDING ON SWELLING   losartan (COZAAR) 50 MG tablet Take 2 tablets (100 mg total) by mouth daily.   Multiple  Vitamin (MULTIVITAMIN) tablet Take 1 tablet by mouth daily.   Multiple Vitamins-Minerals (HAIR SKIN AND NAILS FORMULA PO) Take by mouth.   pantoprazole (PROTONIX) 40 MG tablet Take 1 tablet (40 mg total) by mouth daily.   pravastatin (PRAVACHOL) 40 MG tablet TAKE 1 TABLET BY MOUTH DAILY   [DISCONTINUED] lisinopril-hydrochlorothiazide (PRINZIDE,ZESTORETIC) 10-12.5 MG per tablet Take 1 tablet by mouth daily.   No facility-administered encounter medications on file as of 10/01/2022.    Allergies (verified) Dust mite extract, Iodine, and Shellfish allergy   History: Past Medical History:  Diagnosis Date   Allergy    Carpal tunnel syndrome    Fibroid    GERD (gastroesophageal reflux disease)    History of colon polyps 2002   Hyperlipidemia    Hypertension    Vertigo    Past Surgical History:  Procedure Laterality Date   BIOPSY THYROID     2018   CARPAL TUNNEL RELEASE     bilateral   CATARACT EXTRACTION W/ INTRAOCULAR LENS  IMPLANT, BILATERAL Bilateral 2018   July and August   COLONOSCOPY     hx tubular adenomas 2003   ENDOMETRIAL BIOPSY  09-12-09   --benign/Dr. Romine   HYSTEROSCOPY  10-22-09   resection of polyps and D & C-Dr. Tresa Res   HYSTEROSCOPY  06-17-91   and D & C   KNEE ARTHROSCOPY  2010   right   POLYPECTOMY     UTERINE FIBROID SURGERY  Family History  Problem Relation Age of Onset   Heart disease Mother    Hypertension Mother    Diabetes Mother    Cancer Father        colon cancer?   Stroke Sister    Lung cancer Sister    Colon cancer Neg Hx    Stomach cancer Neg Hx    Esophageal cancer Neg Hx    Rectal cancer Neg Hx    Social History   Socioeconomic History   Marital status: Widowed    Spouse name: Not on file   Number of children: Not on file   Years of education: Not on file   Highest education level: 12th grade  Occupational History   Occupation: Retired  Tobacco Use   Smoking status: Never   Smokeless tobacco: Never  Vaping Use   Vaping  Use: Never used  Substance and Sexual Activity   Alcohol use: Yes    Comment: rarely   Drug use: No   Sexual activity: Yes    Partners: Male  Other Topics Concern   Not on file  Social History Narrative   Not on file   Social Determinants of Health   Financial Resource Strain: Low Risk  (10/01/2022)   Overall Financial Resource Strain (CARDIA)    Difficulty of Paying Living Expenses: Not hard at all  Food Insecurity: No Food Insecurity (10/01/2022)   Hunger Vital Sign    Worried About Running Out of Food in the Last Year: Never true    Ran Out of Food in the Last Year: Never true  Transportation Needs: No Transportation Needs (10/01/2022)   PRAPARE - Administrator, Civil Service (Medical): No    Lack of Transportation (Non-Medical): No  Physical Activity: Inactive (10/01/2022)   Exercise Vital Sign    Days of Exercise per Week: 0 days    Minutes of Exercise per Session: 0 min  Stress: No Stress Concern Present (10/01/2022)   [employer] of Occupational Health - Occupational Stress Questionnaire    Feeling of Stress : Not at all  Social Connections: Moderately Isolated (10/01/2022)   Social Connection and Isolation Panel [NHANES]    Frequency of Communication with Friends and Family: More than three times a week    Frequency of Social Gatherings with Friends and Family: More than three times a week    Attends Religious Services: Never    Database administrator or Organizations: Yes    Attends Engineer, structural: More than 4 times per year    Marital Status: Widowed    Tobacco Counseling Counseling given: Not Answered   Clinical Intake:  Pre-visit preparation completed: Yes  Pain : No/denies pain     Nutritional Risks: None Diabetes: No  How often do you need to have someone help you when you read instructions, pamphlets, or other written materials from your doctor or pharmacy?: 1 - Never  Diabetic?  no  Interpreter Needed?:  No  Information entered by :: Remi Haggard LPN   Activities of Daily Living    10/01/2022    4:04 PM  In your present state of health, do you have any difficulty performing the following activities:  Hearing? 0  Vision? 0  Difficulty concentrating or making decisions? 0  Walking or climbing stairs? 0  Dressing or bathing? 0  Doing errands, shopping? 0  Preparing Food and eating ? N  Using the Toilet? N  In the past six months, have you accidently  leaked urine? Y  Do you have problems with loss of bowel control? N  Managing your Medications? N  Managing your Finances? N  Housekeeping or managing your Housekeeping? N    Patient Care Team: Sheliah Hatch, MD as PCP - General Meryl Dare, MD as Consulting Physician (Gastroenterology) Osborn Coho, MD (Inactive) as Consulting Physician (Otolaryngology) Grant Fontana, MD as Referring Physician (Ophthalmology) Erroll Luna, [facility] (Pharmacist)  Indicate any recent Medical Services you may have received from other than [facility] providers in the past year (date may be approximate).     Assessment:   This is a routine wellness examination for [place].  Hearing/Vision screen Hearing Screening - Comments:: No trouble hearing Vision Screening - Comments:: Up to date [facility]  Dietary issues and exercise activities discussed: Current Exercise Habits: The patient does not participate in regular exercise at present   Goals Addressed             This Visit's Progress    Weight (lb) < 200 lb (90.7 kg)         Depression Screen    10/01/2022    4:07 PM 09/12/2022    1:43 PM 08/21/2022   12:54 PM 04/30/2022   11:28 AM 01/10/2022   10:54 AM 12/23/2021    2:44 PM 07/31/2021   12:59 PM  PHQ 2/9 Scores  PHQ - 2 Score 0 0 0 0 0 0 0  PHQ- 9 Score 2 0 1 1 0 0 0    Fall Risk    10/01/2022    4:03 PM 09/12/2022    1:44 PM 08/21/2022   12:54 PM 04/30/2022   11:28 AM 01/10/2022   10:54 AM  Fall Risk   Falls in the past year? 0  0 0 0 0  Number falls in past yr: 0 0 1 0 0  Injury with Fall? 0 0 0 0 0  Risk for fall due to :  No Fall Risks No Fall Risks No Fall Risks No Fall Risks  Follow up Falls evaluation completed;Education provided;Falls prevention discussed Falls evaluation completed Falls evaluation completed Falls evaluation completed Falls evaluation completed    FALL RISK PREVENTION PERTAINING TO THE HOME:  Any stairs in or around the home? No  If so, are there any without handrails? No  Home free of loose throw rugs in walkways, pet beds, electrical cords, etc? Yes  Adequate lighting in your home to reduce risk of falls? Yes   ASSISTIVE DEVICES UTILIZED TO PREVENT FALLS:  Life alert? No  Use of a cane, walker or w/c? No  Grab bars in the bathroom? No  Shower chair or bench in shower? Yes  Elevated toilet seat or a handicapped toilet? Yes   TIMED UP AND GO:  Was the test performed? No .   Cognitive Function:    10/06/2018   10:11 AM 02/18/2017    9:38 AM  MMSE - Mini Mental State Exam  Orientation to time 5 5  Orientation to Place 5 5  Registration 3 3  Attention/ Calculation 5 5  Recall 3 3  Language- name 2 objects 2 2  Language- repeat 1 1  Language- follow 3 step command 3 3  Language- read & follow direction 1 1  Write a sentence 1 1  Copy design 1 1  Total score 30 30        10/01/2022    4:05 PM  6CIT Screen  What Year? 0 points  What month?  0 points  What time? 0 points  Count back from 20 2 points  Months in reverse 0 points  Repeat phrase 0 points  Total Score 2 points    Immunizations Immunization History  Administered Date(s) Administered   Fluad Quad(high Dose 65+) 12/31/2018, 01/10/2020, 01/09/2021, 01/10/2022   Influenza Split 02/12/2011, 04/29/2012   Influenza, High Dose Seasonal PF 02/18/2017, 01/27/2018, 12/31/2018, 01/10/2020, 01/09/2021   Influenza,inj,Quad PF,6+ Mos 01/26/2013, 01/05/2014, 02/14/2016   Influenza-Unspecified 02/12/2011, 04/29/2012,  02/18/2017, 01/27/2018   PFIZER(Purple Top)SARS-COV-2 Vaccination 05/20/2019, 06/08/2019   Pneumococcal Conjugate-13 02/07/2014   Pneumococcal Polysaccharide-23 03/17/2008, 08/13/2016   Pneumococcal-Unspecified 03/17/2008, 08/13/2016   Tdap 12/31/2018   Unspecified SARS-COV-2 Vaccination 05/20/2019, 06/08/2019   Zoster Recombinat (Shingrix) 04/03/2017, 07/20/2017    TDAP status: Up to date  Flu Vaccine status: Up to date  Pneumococcal vaccine status: Up to date  Covid-19 vaccine status: Information provided on how to obtain vaccines.   Qualifies for Shingles Vaccine? No   Zostavax completed Yes   Shingrix Completed?: Yes  Screening Tests Health Maintenance  Topic Date Due   INFLUENZA VACCINE  11/27/2022   Colonoscopy  04/09/2023   MAMMOGRAM  09/04/2023   DEXA SCAN  09/04/2023   Medicare Annual Wellness (AWV)  10/01/2023   DTaP/Tdap/Td (2 - Td or Tdap) 12/30/2028   Pneumonia Vaccine 39+ Years old  Completed   Hepatitis C Screening  Completed   Zoster Vaccines- Shingrix  Completed   HPV VACCINES  Aged Out   COVID-19 Vaccine  Discontinued    Health Maintenance  There are no preventive care reminders to display for this patient.   Colorectal cancer screening: Type of screening: Colonoscopy. Completed 2019. Repeat every 5 years  Mammogram status: Completed  . Repeat every year  Bone Density status: Completed 2023. Results reflect: Bone density results: NORMAL. Repeat every 2 years.  Lung Cancer Screening: (Low Dose CT Chest recommended if Age 59-80 years, 30 pack-year currently smoking OR have quit w/in 15years.) does not qualify.   Lung Cancer Screening Referral:   Additional Screening:  Hepatitis C Screening: does not qualify; Completed 2022  Vision Screening: Recommended annual ophthalmology exams for early detection of glaucoma and other disorders of the eye. Is the patient up to date with their annual eye exam?  Yes  Who is the provider or what is the name  of the office in which the patient attends annual eye exams? [facility] If pt is not established with a provider, would they like to be referred to a provider to establish care? No .   Dental Screening: Recommended annual dental exams for proper oral hygiene  Community Resource Referral / Chronic Care Management: CRR required this visit?  No   CCM required this visit?  No      Plan:     I have personally reviewed and noted the following in the patient's chart:   Medical and social history Use of alcohol, tobacco or illicit drugs  Current medications and supplements including opioid prescriptions. Patient is not currently taking opioid prescriptions. Functional ability and status Nutritional status Physical activity Advanced directives List of other physicians Hospitalizations, surgeries, and ER visits in previous 12 months Vitals Screenings to include cognitive, depression, and falls Referrals and appointments  In addition, I have reviewed and discussed with patient certain preventive protocols, quality metrics, and best practice recommendations. A written personalized care plan for preventive services as well as general preventive health recommendations were provided to patient.     Remi Haggard, LPN  10/01/2022   Nurse Notes:

## 2022-10-20 ENCOUNTER — Encounter: Payer: Self-pay | Admitting: Family Medicine

## 2022-11-03 DIAGNOSIS — L821 Other seborrheic keratosis: Secondary | ICD-10-CM | POA: Diagnosis not present

## 2022-11-03 DIAGNOSIS — L820 Inflamed seborrheic keratosis: Secondary | ICD-10-CM | POA: Diagnosis not present

## 2022-11-04 ENCOUNTER — Other Ambulatory Visit: Payer: Self-pay | Admitting: Family Medicine

## 2022-11-04 DIAGNOSIS — E785 Hyperlipidemia, unspecified: Secondary | ICD-10-CM

## 2022-11-04 DIAGNOSIS — I10 Essential (primary) hypertension: Secondary | ICD-10-CM

## 2022-11-27 DIAGNOSIS — H40013 Open angle with borderline findings, low risk, bilateral: Secondary | ICD-10-CM | POA: Diagnosis not present

## 2022-11-27 DIAGNOSIS — Z961 Presence of intraocular lens: Secondary | ICD-10-CM | POA: Diagnosis not present

## 2022-11-27 DIAGNOSIS — H04123 Dry eye syndrome of bilateral lacrimal glands: Secondary | ICD-10-CM | POA: Diagnosis not present

## 2022-11-27 DIAGNOSIS — H524 Presbyopia: Secondary | ICD-10-CM | POA: Diagnosis not present

## 2022-12-09 ENCOUNTER — Other Ambulatory Visit: Payer: Self-pay | Admitting: Family Medicine

## 2022-12-16 ENCOUNTER — Other Ambulatory Visit: Payer: Self-pay | Admitting: Family Medicine

## 2022-12-16 DIAGNOSIS — I10 Essential (primary) hypertension: Secondary | ICD-10-CM

## 2023-01-01 ENCOUNTER — Ambulatory Visit (INDEPENDENT_AMBULATORY_CARE_PROVIDER_SITE_OTHER): Payer: Medicare Other

## 2023-01-01 DIAGNOSIS — Z23 Encounter for immunization: Secondary | ICD-10-CM

## 2023-01-01 NOTE — Progress Notes (Signed)
Pt came in for her flu vaccine . No concerns today and tolerated injection well . Gave in left deltoid

## 2023-01-22 DIAGNOSIS — H04123 Dry eye syndrome of bilateral lacrimal glands: Secondary | ICD-10-CM | POA: Diagnosis not present

## 2023-01-22 DIAGNOSIS — H0234 Blepharochalasis left upper eyelid: Secondary | ICD-10-CM | POA: Diagnosis not present

## 2023-01-22 DIAGNOSIS — H0279 Other degenerative disorders of eyelid and periocular area: Secondary | ICD-10-CM | POA: Diagnosis not present

## 2023-01-22 DIAGNOSIS — H02423 Myogenic ptosis of bilateral eyelids: Secondary | ICD-10-CM | POA: Diagnosis not present

## 2023-01-22 DIAGNOSIS — H0231 Blepharochalasis right upper eyelid: Secondary | ICD-10-CM | POA: Diagnosis not present

## 2023-02-20 ENCOUNTER — Encounter: Payer: Self-pay | Admitting: Family Medicine

## 2023-02-20 ENCOUNTER — Encounter: Payer: Medicare Other | Admitting: Family Medicine

## 2023-02-20 DIAGNOSIS — H16223 Keratoconjunctivitis sicca, not specified as Sjogren's, bilateral: Secondary | ICD-10-CM | POA: Diagnosis not present

## 2023-02-20 DIAGNOSIS — H0279 Other degenerative disorders of eyelid and periocular area: Secondary | ICD-10-CM | POA: Diagnosis not present

## 2023-02-20 DIAGNOSIS — H0102B Squamous blepharitis left eye, upper and lower eyelids: Secondary | ICD-10-CM | POA: Diagnosis not present

## 2023-02-20 DIAGNOSIS — H0102A Squamous blepharitis right eye, upper and lower eyelids: Secondary | ICD-10-CM | POA: Diagnosis not present

## 2023-02-20 DIAGNOSIS — H04213 Epiphora due to excess lacrimation, bilateral lacrimal glands: Secondary | ICD-10-CM | POA: Diagnosis not present

## 2023-02-26 ENCOUNTER — Encounter: Payer: Self-pay | Admitting: Gastroenterology

## 2023-03-03 ENCOUNTER — Encounter: Payer: Self-pay | Admitting: Gastroenterology

## 2023-03-11 ENCOUNTER — Other Ambulatory Visit: Payer: Self-pay | Admitting: Family Medicine

## 2023-03-11 DIAGNOSIS — I10 Essential (primary) hypertension: Secondary | ICD-10-CM

## 2023-03-20 DIAGNOSIS — H04213 Epiphora due to excess lacrimation, bilateral lacrimal glands: Secondary | ICD-10-CM | POA: Diagnosis not present

## 2023-03-20 DIAGNOSIS — H0102B Squamous blepharitis left eye, upper and lower eyelids: Secondary | ICD-10-CM | POA: Diagnosis not present

## 2023-03-20 DIAGNOSIS — H0279 Other degenerative disorders of eyelid and periocular area: Secondary | ICD-10-CM | POA: Diagnosis not present

## 2023-03-20 DIAGNOSIS — H0102A Squamous blepharitis right eye, upper and lower eyelids: Secondary | ICD-10-CM | POA: Diagnosis not present

## 2023-03-20 DIAGNOSIS — H16223 Keratoconjunctivitis sicca, not specified as Sjogren's, bilateral: Secondary | ICD-10-CM | POA: Diagnosis not present

## 2023-04-02 ENCOUNTER — Encounter: Payer: Medicare Other | Admitting: Family Medicine

## 2023-04-02 ENCOUNTER — Encounter: Payer: Self-pay | Admitting: Family Medicine

## 2023-05-07 ENCOUNTER — Ambulatory Visit (INDEPENDENT_AMBULATORY_CARE_PROVIDER_SITE_OTHER): Payer: Medicare Other | Admitting: Family Medicine

## 2023-05-07 ENCOUNTER — Encounter: Payer: Self-pay | Admitting: Family Medicine

## 2023-05-07 VITALS — BP 110/68 | HR 68 | Temp 98.7°F | Ht 68.0 in | Wt 239.0 lb

## 2023-05-07 DIAGNOSIS — Z0000 Encounter for general adult medical examination without abnormal findings: Secondary | ICD-10-CM | POA: Diagnosis not present

## 2023-05-07 DIAGNOSIS — I10 Essential (primary) hypertension: Secondary | ICD-10-CM | POA: Diagnosis not present

## 2023-05-07 LAB — CBC WITH DIFFERENTIAL/PLATELET
Basophils Absolute: 0 10*3/uL (ref 0.0–0.1)
Basophils Relative: 0.5 % (ref 0.0–3.0)
Eosinophils Absolute: 0.1 10*3/uL (ref 0.0–0.7)
Eosinophils Relative: 1.7 % (ref 0.0–5.0)
HCT: 42.2 % (ref 36.0–46.0)
Hemoglobin: 14.4 g/dL (ref 12.0–15.0)
Lymphocytes Relative: 40.1 % (ref 12.0–46.0)
Lymphs Abs: 1.4 10*3/uL (ref 0.7–4.0)
MCHC: 34.1 g/dL (ref 30.0–36.0)
MCV: 97.6 fL (ref 78.0–100.0)
Monocytes Absolute: 0.3 10*3/uL (ref 0.1–1.0)
Monocytes Relative: 10 % (ref 3.0–12.0)
Neutro Abs: 1.7 10*3/uL (ref 1.4–7.7)
Neutrophils Relative %: 47.7 % (ref 43.0–77.0)
Platelets: 256 10*3/uL (ref 150.0–400.0)
RBC: 4.33 Mil/uL (ref 3.87–5.11)
RDW: 13.8 % (ref 11.5–15.5)
WBC: 3.5 10*3/uL — ABNORMAL LOW (ref 4.0–10.5)

## 2023-05-07 LAB — HEPATIC FUNCTION PANEL
ALT: 13 U/L (ref 0–35)
AST: 19 U/L (ref 0–37)
Albumin: 4.4 g/dL (ref 3.5–5.2)
Alkaline Phosphatase: 70 U/L (ref 39–117)
Bilirubin, Direct: 0.2 mg/dL (ref 0.0–0.3)
Total Bilirubin: 1 mg/dL (ref 0.2–1.2)
Total Protein: 7.3 g/dL (ref 6.0–8.3)

## 2023-05-07 LAB — LIPID PANEL
Cholesterol: 151 mg/dL (ref 0–200)
HDL: 50.2 mg/dL (ref >39.00)
LDL Cholesterol: 78 mg/dL (ref 0–99)
NonHDL: 100.69
Total CHOL/HDL Ratio: 3
Triglycerides: 115 mg/dL (ref 0.0–149.0)
VLDL: 23 mg/dL (ref 0.0–40.0)

## 2023-05-07 LAB — BASIC METABOLIC PANEL
BUN: 21 mg/dL (ref 6–23)
CO2: 28 meq/L (ref 19–32)
Calcium: 10.6 mg/dL — ABNORMAL HIGH (ref 8.4–10.5)
Chloride: 102 meq/L (ref 96–112)
Creatinine, Ser: 0.78 mg/dL (ref 0.40–1.20)
GFR: 72.57 mL/min (ref >60.00)
Glucose, Bld: 85 mg/dL (ref 70–99)
Potassium: 3.7 meq/L (ref 3.5–5.1)
Sodium: 137 meq/L (ref 135–145)

## 2023-05-07 LAB — TSH: TSH: 2.39 u[IU]/mL (ref 0.35–5.50)

## 2023-05-07 NOTE — Patient Instructions (Signed)
Follow up in 6 months to recheck BP and cholesterol We'll notify you of your lab results and make any changes if needed Continue to work on healthy diet and regular exercise- you're doing great! Call with any questions or concerns Stay Safe!  Stay Healthy! Happy New Year!!!

## 2023-05-07 NOTE — Progress Notes (Signed)
   Subjective:    Patient ID: Shelby Reeves, female    DOB: May 27, 1945, 78 y.o.   MRN: [id number]  HPI CPE- UTD on mammo, DEXA, Tdap, PNA.  No longer doing colonoscopy  Patient Care Team    Relationship Specialty Notifications Start End  Mahlon Comer BRAVO, MD PCP - General   05/15/10   Aneita Gwendlyn DASEN, MD (Inactive) Consulting Physician Gastroenterology  02/09/15   Mable Lenis, MD (Inactive) Consulting Physician Otolaryngology  02/18/17   Lelon JONELLE Ferrari, MD Referring Physician Ophthalmology  10/06/18   Nicholaus Sherlean CROME, [facility] (Inactive)  Pharmacist  09/18/21    Comment: [phone]     Health Maintenance  Topic Date Due   COVID-19 Vaccine (3 - Pfizer risk series) 07/06/2019   MAMMOGRAM  09/04/2023   DEXA SCAN  09/04/2023   Medicare Annual Wellness (AWV)  10/01/2023   DTaP/Tdap/Td (2 - Td or Tdap) 12/30/2028   Pneumonia Vaccine 86+ Years old  Completed   INFLUENZA VACCINE  Completed   Hepatitis C Screening  Completed   Zoster Vaccines- Shingrix  Completed   HPV VACCINES  Aged Out   Colonoscopy  Discontinued      Review of Systems Patient reports no vision/ hearing changes, adenopathy,fever,  persistant/recurrent hoarseness , swallowing issues, chest pain, palpitations, edema, persistant/recurrent cough, hemoptysis, dyspnea (rest/exertional/paroxysmal nocturnal), gastrointestinal bleeding (melena, rectal bleeding), abdominal pain, significant heartburn, bowel changes, GU symptoms (dysuria, hematuria, incontinence), Gyn symptoms (abnormal  bleeding, pain),  syncope, focal weakness, memory loss, numbness & tingling, skin/hair/nail changes, abnormal bruising or bleeding, anxiety, or depression.   + 25 lb weight loss    Objective:   Physical Exam General Appearance:    Alert, cooperative, no distress, appears stated age, obese  Head:    Normocephalic, without obvious abnormality, atraumatic  Eyes:    PERRL, conjunctiva/corneas clear, EOM's intact both eyes  Ears:    Normal  TM's and external ear canals, both ears  Nose:   Nares normal, septum midline, mucosa normal, no drainage    or sinus tenderness  Throat:   Lips, mucosa, and tongue normal; teeth and gums normal  Neck:   Supple, symmetrical, trachea midline, no adenopathy;    Thyroid : no enlargement/tenderness/nodules  Back:     Symmetric, no curvature, ROM normal, no CVA tenderness  Lungs:     Clear to auscultation bilaterally, respirations unlabored  Chest Wall:    No tenderness or deformity   Heart:    Regular rate and rhythm, S1 and S2 normal, no murmur, rub   or gallop  Breast Exam:    Deferred to mammo  Abdomen:     Soft, non-tender, bowel sounds active all four quadrants,    no masses, no organomegaly  Genitalia:    Deferred  Rectal:    Extremities:   Extremities normal, atraumatic, no cyanosis or edema  Pulses:   2+ and symmetric all extremities  Skin:   Skin color, texture, turgor normal, no rashes or lesions  Lymph nodes:   Cervical, supraclavicular, and axillary nodes normal  Neurologic:   CNII-XII intact, normal strength, sensation and reflexes    throughout          Assessment & Plan:

## 2023-05-07 NOTE — Assessment & Plan Note (Signed)
 Pt's PE WNL w/ exception of BMI.  UTD on mammo, DEXA, Tdap, PNA.  Check labs.  Anticipatory guidance provided.

## 2023-05-07 NOTE — Assessment & Plan Note (Signed)
 Chronic problem.  Pt's BP is well controlled today on Losartan 50mg  daily (not 100mg ).  No changes at this time.

## 2023-05-08 ENCOUNTER — Telehealth: Payer: Self-pay

## 2023-05-08 NOTE — Telephone Encounter (Signed)
-----   Message from Neena Rhymes sent at 05/08/2023  7:48 AM EST ----- Labs are stable and look good!  No changes at this time

## 2023-05-08 NOTE — Telephone Encounter (Signed)
 Pt has reviewed labs via MyChart

## 2023-05-13 ENCOUNTER — Other Ambulatory Visit: Payer: Self-pay | Admitting: Family Medicine

## 2023-05-13 DIAGNOSIS — I10 Essential (primary) hypertension: Secondary | ICD-10-CM

## 2023-05-13 DIAGNOSIS — E785 Hyperlipidemia, unspecified: Secondary | ICD-10-CM

## 2023-05-13 NOTE — Telephone Encounter (Signed)
 Copied from CRM [id number]. Topic: Clinical - Medication Refill >> May 13, 2023  4:28 PM Isabell A wrote: Most Recent Primary Care Visit:  Provider: TABORI, KATHERINE E  Department: [facility]  Visit Type: PHYSICAL  Date: 05/07/2023  Medication: ***  Has the patient contacted their pharmacy?  (Agent: If no, request that the patient contact the pharmacy for the refill. If patient does not wish to contact the pharmacy document the reason why and proceed with request.) (Agent: If yes, when and what did the pharmacy advise?)  Is this the correct pharmacy for this prescription?  If no, delete pharmacy and type the correct one.  This is the patient's preferred pharmacy:  [facility] [address] [place], [place] - [address] AT [facility] [address] [place] [address] Phone: [phone] Fax: [phone]   Has the prescription been filled recently?   Is the patient out of the medication?   Has the patient been seen for an appointment in the last year OR does the patient have an upcoming appointment?   Can we respond through MyChart?   Agent: Please be advised that Rx refills may take up to 3 business days. We ask that you follow-up with your pharmacy.

## 2023-05-19 ENCOUNTER — Other Ambulatory Visit: Payer: Self-pay | Admitting: Family Medicine

## 2023-05-19 DIAGNOSIS — E785 Hyperlipidemia, unspecified: Secondary | ICD-10-CM

## 2023-05-19 DIAGNOSIS — I10 Essential (primary) hypertension: Secondary | ICD-10-CM

## 2023-05-19 MED ORDER — PRAVASTATIN SODIUM 40 MG PO TABS: ORAL_TABLET | ORAL | 1 refills | Status: DC

## 2023-05-19 MED ORDER — HYDROCHLOROTHIAZIDE 12.5 MG PO TABS: ORAL_TABLET | ORAL | 1 refills | Status: DC

## 2023-05-19 NOTE — Telephone Encounter (Signed)
Copied from CRM [id number]. Topic: Clinical - Medication Refill >> May 19, 2023 10:36 AM Dollene Primrose wrote: Most Recent Primary Care Visit:  Provider: Sheliah Hatch  Department: [facility]  Visit Type: PHYSICAL  Date: 05/07/2023  Medication: 1-hydrochlorothiazide (HYDRODIURIL) 12.5 MG tablet  2-pravastatin (PRAVACHOL) 40 MG tablet   Has the patient contacted their pharmacy? Yes-needs refill sent (Agent: If no, request that the patient contact the pharmacy for the refill. If patient does not wish to contact the pharmacy document the reason why and proceed with request.) (Agent: If yes, when and what did the pharmacy advise?)  Is this the correct pharmacy for this prescription? yes If no, delete pharmacy and type the correct one.  This is the patient's preferred pharmacy:  [facility] [address] [place], [place] - [address] AT [facility] [address] [place] [address] Phone: [phone] Fax: [phone]   Has the prescription been filled recently? yes  Is the patient out of the medication? yes  Has the patient been seen for an appointment in the last year OR does the patient have an upcoming appointment? yes  Can we respond through MyChart? yes  Agent: Please be advised that Rx refills may take up to 3 business days. We ask that you follow-up with your pharmacy.

## 2023-06-06 ENCOUNTER — Other Ambulatory Visit: Payer: Self-pay | Admitting: Family Medicine

## 2023-06-06 DIAGNOSIS — I10 Essential (primary) hypertension: Secondary | ICD-10-CM

## 2023-07-24 IMAGING — MG MM DIGITAL SCREENING BILAT W/ TOMO AND CAD
8 series · 8 of 24 positions shown · non-contrast
Comparison: Previous exam(s).

CLINICAL DATA: Screening.

EXAM:
DIGITAL SCREENING BILATERAL MAMMOGRAM WITH TOMOSYNTHESIS AND CAD
TECHNIQUE: Bilateral screening digital craniocaudal and mediolateral oblique
mammograms were obtained. Bilateral screening digital breast
tomosynthesis was performed. The images were evaluated with
computer-aided detection.

[R MLO synth-2D]
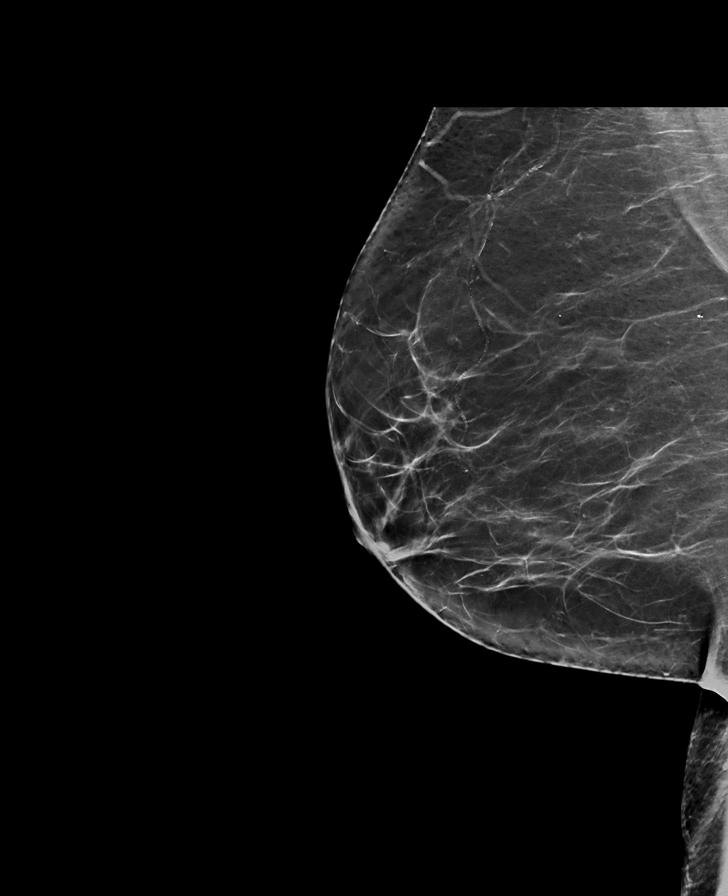

[L MLO synth-2D]
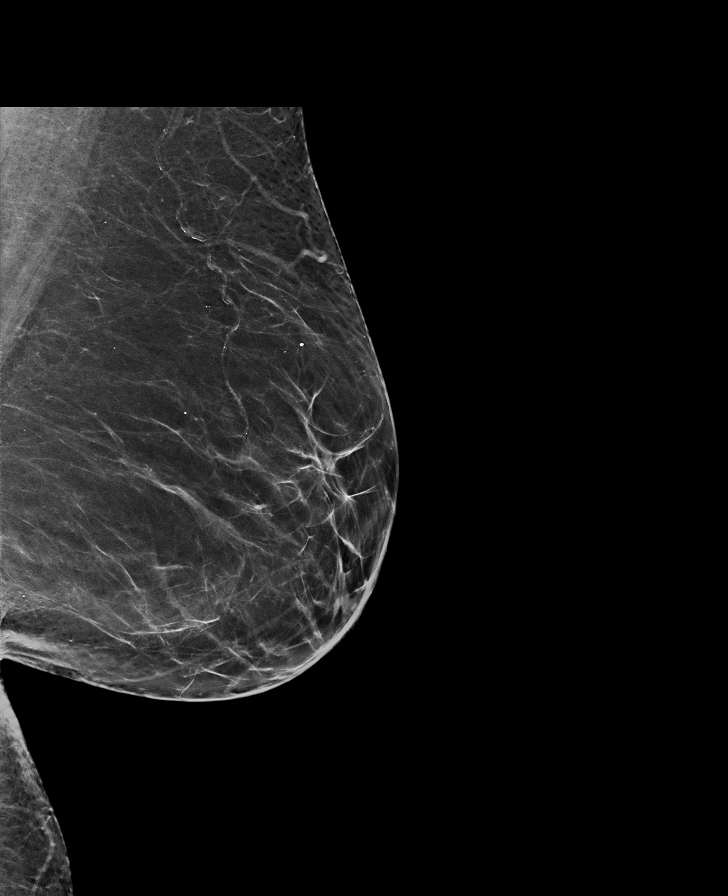

[R CC synth-2D]
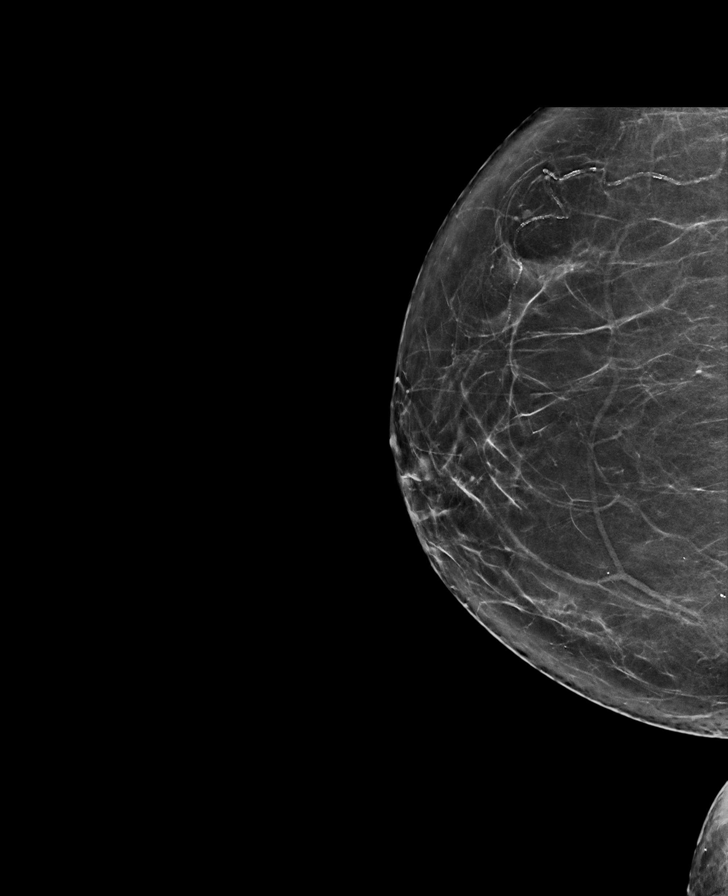

[L CC synth-2D]
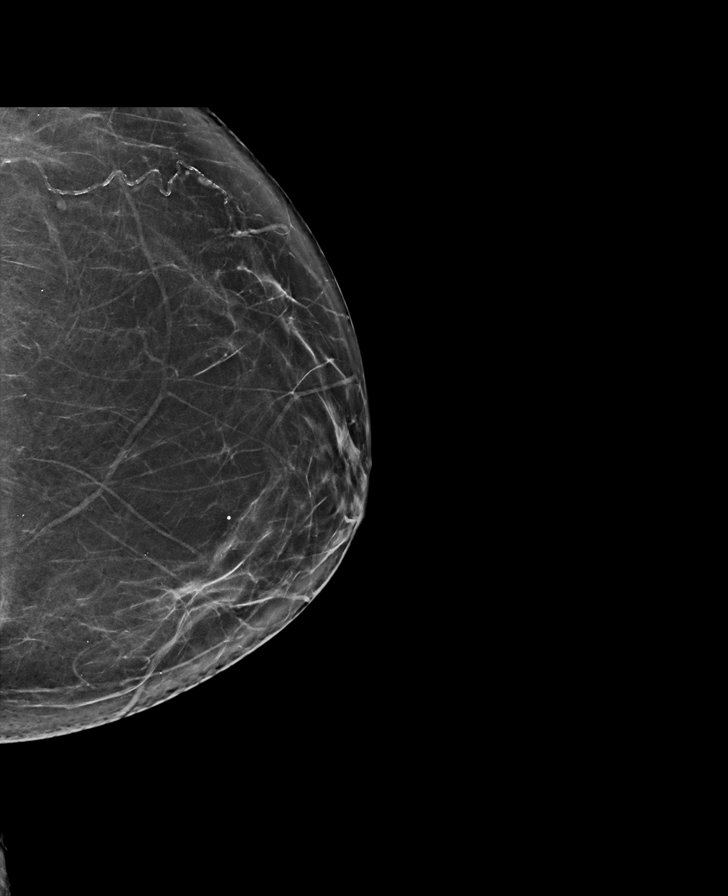

[L MLO tomo · tomo slice 37/73.0]
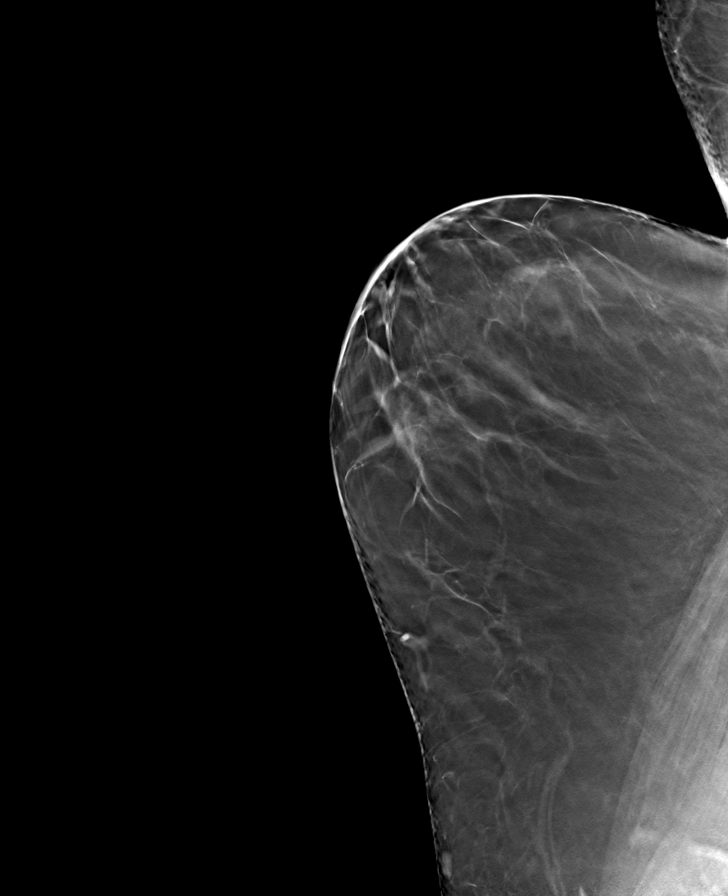

[L CC tomo · tomo slice 33/66.0]
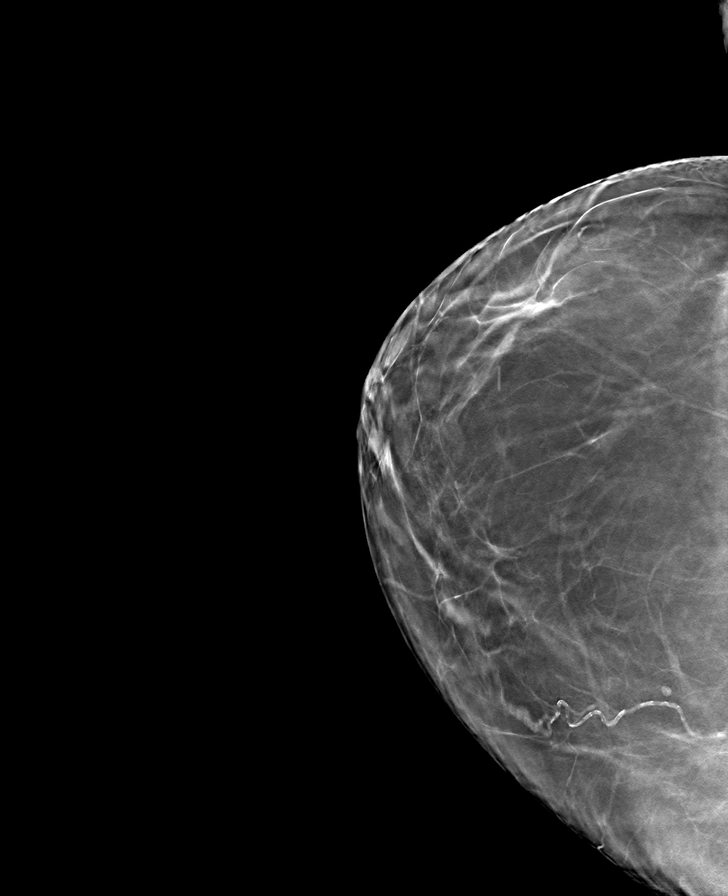

[R MLO tomo · tomo slice 39/77.0]
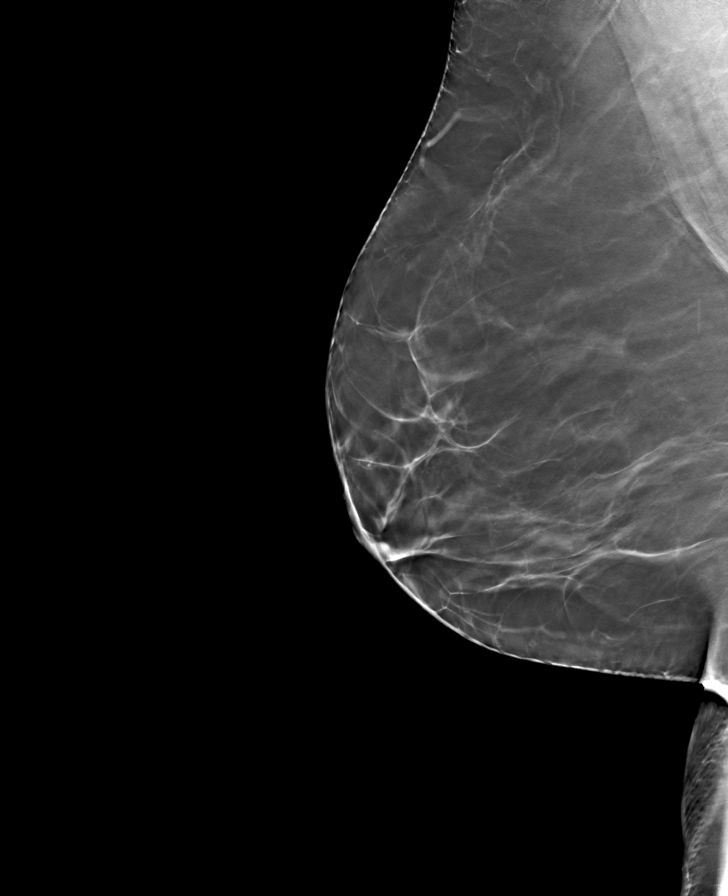

[R CC tomo · tomo slice 33/66.0]
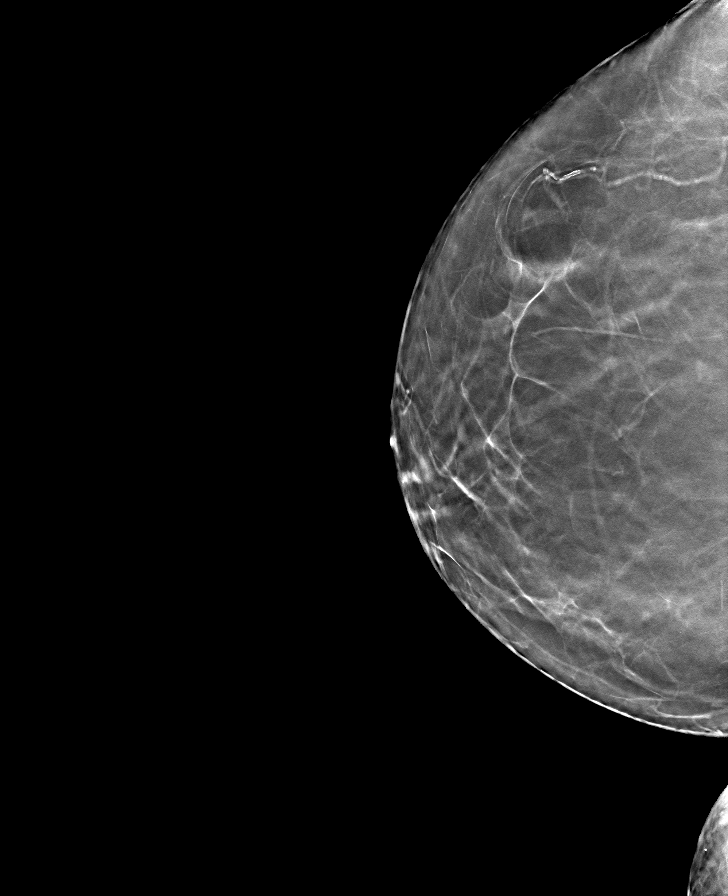

[8 of 24 positions shown; findings below may reference images not displayed]

ACR Breast Density Category b: There are scattered areas of
fibroglandular density.
FINDINGS: There are no findings suspicious for malignancy.
IMPRESSION: No mammographic evidence of malignancy. A result letter of this
screening mammogram will be mailed directly to the patient.

RECOMMENDATION:
Screening mammogram in one year. (Code:51-O-LD2)

BI-RADS CATEGORY  1: Negative.

## 2023-08-17 ENCOUNTER — Other Ambulatory Visit: Payer: Self-pay | Admitting: Family Medicine

## 2023-08-17 ENCOUNTER — Telehealth: Payer: Self-pay

## 2023-08-17 DIAGNOSIS — I10 Essential (primary) hypertension: Secondary | ICD-10-CM

## 2023-08-17 NOTE — Telephone Encounter (Signed)
 Copied from CRM [id number]. Topic: Clinical - Medication Refill >> Aug 17, 2023  3:19 PM Alyse July wrote: Most Recent Primary Care Visit:  Provider: TABORI, KATHERINE E  Department: [facility]  Visit Type: PHYSICAL  Date: 05/07/2023  Medication: losartan  (COZAAR ) 50 MG tablet  Has the patient contacted their pharmacy? Yes (Agent: If no, request that the patient contact the pharmacy for the refill. If patient does not wish to contact the pharmacy document the reason why and proceed with request.) (Agent: If yes, when and what did the pharmacy advise?)  Is this the correct pharmacy for this prescription? Yes If no, delete pharmacy and type the correct one.  This is the patient's preferred pharmacy:  [facility] [address] [place], [place] - [address] AT [facility] [address] [place] [address] Phone: [phone] Fax: [phone]  Has the prescription been filled recently? No  Is the patient out of the medication? No  Has the patient been seen for an appointment in the last year OR does the patient have an upcoming appointment? Yes  Can we respond through MyChart? Yes  Agent: Please be advised that Rx refills may take up to 3 business days. We ask that you follow-up with your pharmacy.

## 2023-08-18 MED ORDER — LOSARTAN POTASSIUM 50 MG PO TABS: 50.0000 mg | ORAL_TABLET | Freq: Every day | ORAL | 1 refills | Status: DC

## 2023-08-20 ENCOUNTER — Encounter: Payer: Self-pay | Admitting: Student in an Organized Health Care Education/Training Program

## 2023-08-20 ENCOUNTER — Ambulatory Visit (INDEPENDENT_AMBULATORY_CARE_PROVIDER_SITE_OTHER): Admitting: Student in an Organized Health Care Education/Training Program

## 2023-08-20 VITALS — BP 130/64 | HR 58 | Wt 254.0 lb

## 2023-08-20 DIAGNOSIS — H00011 Hordeolum externum right upper eyelid: Secondary | ICD-10-CM

## 2023-08-20 DIAGNOSIS — H00013 Hordeolum externum right eye, unspecified eyelid: Secondary | ICD-10-CM | POA: Insufficient documentation

## 2023-08-20 MED ORDER — ERYTHROMYCIN 5 MG/GM OP OINT: TOPICAL_OINTMENT | Freq: Three times a day (TID) | OPHTHALMIC | 0 refills | Status: AC

## 2023-08-20 NOTE — Assessment & Plan Note (Signed)
 History and exam today are consistent with an acute hordeolum of the right upper medial eyelid.  Not much drainage from the gland on my exam, I cannot see any obstruction.  No fluctuance of the eyelid, no signs of preseptal cellulitis.  She has a history of chronic styes happening several times a year.  Additionally she had some kind of blepharoplasty on the right eye about a year ago because of drooping eyelid.  Recommended that she revisit with her ophthalmologist for a slit-lamp exam to get a better look at the meibomian glands to see if they can find a cause of this recurrent obstruction.  In the meantime I prescribed topical erythromycin  ointment to be used 3 times a day to improve the swelling and discomfort.

## 2023-08-20 NOTE — Progress Notes (Signed)
   Acute Office Visit  Subjective:     Patient ID: Shelby Reeves, female    DOB: 12-29-1945, 78 y.o.   MRN: [id number]  Chief Complaint  Patient presents with   Eye Problem    Stye on right eye, patient states it seems to be getting better but will not fully go away. Noticed the stye last Saturday.     HPI  Patient is in today for a stye on the right eyelid.  Patient reports this has been going on about 3-4 days.  It was very tender, today the tenderness is improving.  She has a stye in this eye a couple times a year.  She is established with ophthalmology, no history of blepharitis or psoriasis.  She does report an eyelid surgery about a year ago to improve her droopy eyelid.  This was done with a oculoplastic surgeon.  No systemic symptoms.  Otherwise feels well.  Denies any changes in her health.      Objective:    BP 130/64   Pulse (!) 58   Wt 254 lb (115.2 kg)   SpO2 97%   BMI 38.62 kg/m    Physical Exam  Gen: Well-appearing woman Eyes: Mild focal swelling over the medial upper right eyelid, no purulent drainage, no fluctuance in the eyelid, conjunctiva appear normal, normal vision and both eyes, IOLs in place, normal reactive pupils.      Assessment & Plan:   Problem List Items Addressed This Visit       Unprioritized   Hordeolum externum of right eye - Primary   History and exam today are consistent with an acute hordeolum of the right upper medial eyelid.  Not much drainage from the gland on my exam, I cannot see any obstruction.  No fluctuance of the eyelid, no signs of preseptal cellulitis.  She has a history of chronic styes happening several times a year.  Additionally she had some kind of blepharoplasty on the right eye about a year ago because of drooping eyelid.  Recommended that she revisit with her ophthalmologist for a slit-lamp exam to get a better look at the meibomian glands to see if they can find a cause of this recurrent obstruction.  In the meantime  I prescribed topical erythromycin  ointment to be used 3 times a day to improve the swelling and discomfort.      Relevant Medications   erythromycin  ophthalmic ointment    Meds ordered this encounter  Medications   erythromycin  ophthalmic ointment    Sig: Place into the right eye 3 (three) times daily.    Dispense:  3.5 g    Refill:  0    No follow-ups on file.  Ether Hercules, MD

## 2023-08-27 ENCOUNTER — Other Ambulatory Visit: Payer: Self-pay | Admitting: Family Medicine

## 2023-08-27 DIAGNOSIS — E785 Hyperlipidemia, unspecified: Secondary | ICD-10-CM

## 2023-09-25 ENCOUNTER — Ambulatory Visit: Admitting: Family Medicine

## 2023-09-25 NOTE — Telephone Encounter (Signed)
 See previous

## 2023-09-28 ENCOUNTER — Encounter: Payer: Self-pay | Admitting: Family Medicine

## 2023-09-28 ENCOUNTER — Ambulatory Visit (INDEPENDENT_AMBULATORY_CARE_PROVIDER_SITE_OTHER): Admitting: Family Medicine

## 2023-09-28 VITALS — BP 130/70 | HR 69 | Temp 98.0°F | Ht 68.0 in | Wt 256.1 lb

## 2023-09-28 DIAGNOSIS — L680 Hirsutism: Secondary | ICD-10-CM

## 2023-09-28 DIAGNOSIS — H00014 Hordeolum externum left upper eyelid: Secondary | ICD-10-CM | POA: Diagnosis not present

## 2023-09-28 DIAGNOSIS — M25561 Pain in right knee: Secondary | ICD-10-CM | POA: Diagnosis not present

## 2023-09-28 DIAGNOSIS — G8929 Other chronic pain: Secondary | ICD-10-CM

## 2023-09-28 MED ORDER — SPIRONOLACTONE 25 MG PO TABS: 25.0000 mg | ORAL_TABLET | Freq: Every day | ORAL | 1 refills | Status: DC

## 2023-09-28 MED ORDER — PANTOPRAZOLE SODIUM 40 MG PO TBEC: 40.0000 mg | DELAYED_RELEASE_TABLET | Freq: Every day | ORAL | 1 refills | Status: DC

## 2023-09-28 NOTE — Patient Instructions (Addendum)
 Schedule a follow up visit in 5 weeks to recheck BP with the addition of Spironolactone APPLY hot compresses to the stye.  Use the leftover ointment as needed START the Spironolactone daily to help w/ facial hair We'll call you to schedule your Ortho appt for the knee Call with any questions or concerns Hang in there!!

## 2023-09-28 NOTE — Progress Notes (Signed)
   Subjective:    Patient ID: Shelby Reeves, female    DOB: 1946-01-04, 78 y.o.   MRN: [id number]  HPI Knee pain- R sided.  Pt reports years ago (~15 yrs) she had laparoscopic surgery for torn meniscus.  Has had pain for months but this is worsening.  Pain is behind knee and radiates into calf where the muscle will get tight.  No obvious swelling.  No TTP.  Difficulty going down stairs- 'it feels like my knee is popping'.  Has some anterior 'grinding' w/ leg flexion and extension.  Facial hair- 'i'm getting a beard'.  Is shaving daily  L eye pain- has stye on L eye.  Earlier this spring had similar sxs on R side.  Did not have any issues until after her eyelid lift.  Has erythromycin  ointment leftover from last one.   Review of Systems For ROS see HPI     Objective:   Physical Exam Vitals reviewed.  Constitutional:      General: She is not in acute distress.    Appearance: Normal appearance. She is not ill-appearing.  HENT:     Head: Normocephalic and atraumatic.  Eyes:     Extraocular Movements: Extraocular movements intact.     Conjunctiva/sclera: Conjunctivae normal.     Pupils: Pupils are equal, round, and reactive to light.     Comments: Pinpoint whitehead along L upper lash line consistent w/ small/early stye  Cardiovascular:     Rate and Rhythm: Normal rate and regular rhythm.  Pulmonary:     Effort: Pulmonary effort is normal. No respiratory distress.  Musculoskeletal:        General: Swelling (posterior to R knee- possible Baker's cyst) and tenderness (TTP over R anterior knee along lateral joint line.  + crepitus w/ flexion and extension) present.  Skin:    General: Skin is warm and dry.  Neurological:     General: No focal deficit present.     Mental Status: She is alert and oriented to person, place, and time.  Psychiatric:        Mood and Affect: Mood normal.        Behavior: Behavior normal.        Thought Content: Thought content normal.            Assessment & Plan:  R knee pain- new.  Pt has hx of meniscus surgery ~15 yrs ago.  Pain has been worsening over the last few months.  Now having trouble going up and down stairs.  Given PE, suspect possible Baker's cyst posteriorly and lateral meniscus issue.  Refer to Ortho for complete evaluation and tx.  Pt expressed understanding and is in agreement w/ plan.   Hirsutism- new to provider, ongoing for pt.  She states she is growing a 'beard' and is shaving daily.  Will start Spironolactone and see if sxs improve.  Pt expressed understanding and is in agreement w/ plan.   Stye- new.  Pt reports she had similar area on R eye a few months ago.  Encouraged hot compresses.  If area enlarges, she can use leftover Erythromycin  ointment.  Pt expressed understanding and is in agreement w/ plan.

## 2023-10-12 DIAGNOSIS — M25561 Pain in right knee: Secondary | ICD-10-CM | POA: Diagnosis not present

## 2023-10-12 DIAGNOSIS — M25562 Pain in left knee: Secondary | ICD-10-CM | POA: Diagnosis not present

## 2023-11-04 ENCOUNTER — Ambulatory Visit: Payer: Self-pay | Admitting: Family Medicine

## 2023-11-04 ENCOUNTER — Ambulatory Visit: Payer: Medicare Other | Admitting: Family Medicine

## 2023-11-04 ENCOUNTER — Ambulatory Visit (INDEPENDENT_AMBULATORY_CARE_PROVIDER_SITE_OTHER): Admitting: Family Medicine

## 2023-11-04 ENCOUNTER — Encounter: Payer: Self-pay | Admitting: Family Medicine

## 2023-11-04 VITALS — BP 130/62 | HR 61 | Ht 68.0 in | Wt 256.4 lb

## 2023-11-04 DIAGNOSIS — L680 Hirsutism: Secondary | ICD-10-CM | POA: Diagnosis not present

## 2023-11-04 DIAGNOSIS — I10 Essential (primary) hypertension: Secondary | ICD-10-CM | POA: Diagnosis not present

## 2023-11-04 LAB — BASIC METABOLIC PANEL WITH GFR
BUN: 16 mg/dL (ref 6–23)
CO2: 30 meq/L (ref 19–32)
Calcium: 10.4 mg/dL (ref 8.4–10.5)
Chloride: 102 meq/L (ref 96–112)
Creatinine, Ser: 0.85 mg/dL (ref 0.40–1.20)
GFR: 65.23 mL/min (ref >60.00)
Glucose, Bld: 97 mg/dL (ref 70–99)
Potassium: 4.2 meq/L (ref 3.5–5.1)
Sodium: 137 meq/L (ref 135–145)

## 2023-11-04 LAB — TSH: TSH: 1.88 u[IU]/mL (ref 0.35–5.50)

## 2023-11-04 MED ORDER — SPIRONOLACTONE 50 MG PO TABS: 50.0000 mg | ORAL_TABLET | Freq: Every day | ORAL | 1 refills | Status: AC

## 2023-11-04 NOTE — Progress Notes (Signed)
   Subjective:    Patient ID: Rock GORMAN Garret, female    DOB: Aug 31, 1945, 78 y.o.   MRN: [id number]  HPI Hirsutism- at last visit pt was started on Spironolactone  25mg  daily.  She was to return to ensure BP did not drop too low and repeat K+.  Pt denies side effects- cramping in particular- but has not noticed a change in facial hair.  Review of Systems For ROS see HPI     Objective:   Physical Exam Vitals reviewed.  Constitutional:      General: She is not in acute distress.    Appearance: Normal appearance. She is obese. She is not ill-appearing.  HENT:     Head: Normocephalic and atraumatic.  Eyes:     Extraocular Movements: Extraocular movements intact.     Conjunctiva/sclera: Conjunctivae normal.  Cardiovascular:     Rate and Rhythm: Normal rate and regular rhythm.  Pulmonary:     Effort: Pulmonary effort is normal. No respiratory distress.  Skin:    General: Skin is warm and dry.  Neurological:     General: No focal deficit present.     Mental Status: She is alert and oriented to person, place, and time.  Psychiatric:        Mood and Affect: Mood normal.        Behavior: Behavior normal.        Thought Content: Thought content normal.           Assessment & Plan:  Hirsutism- no change w/ initiation of Spironolactone  last visit.  She has tolerated the medication w/o difficulty.  BP is stable.  Will increase to treatment dose of 50mg  daily.  Pt expressed understanding and is in agreement w/ plan.

## 2023-11-04 NOTE — Patient Instructions (Addendum)
 Follow up in 6 weeks to recheck blood pressure and potassium We'll notify you of your lab results and make any changes if needed INCREASE the Spirnolactone to 50mg  daily- 2 of what you have at home and 1 of the new prescription Call with any questions or concerns Stay Safe!  Stay Healthy! Have a great summer!!!

## 2023-11-10 ENCOUNTER — Other Ambulatory Visit: Payer: Self-pay | Admitting: Family Medicine

## 2023-11-10 DIAGNOSIS — I10 Essential (primary) hypertension: Secondary | ICD-10-CM

## 2023-11-16 DIAGNOSIS — M5442 Lumbago with sciatica, left side: Secondary | ICD-10-CM | POA: Diagnosis not present

## 2023-11-19 ENCOUNTER — Encounter

## 2023-11-19 ENCOUNTER — Other Ambulatory Visit: Payer: Self-pay | Admitting: Family Medicine

## 2023-11-19 DIAGNOSIS — I10 Essential (primary) hypertension: Secondary | ICD-10-CM

## 2023-11-23 DIAGNOSIS — M5416 Radiculopathy, lumbar region: Secondary | ICD-10-CM | POA: Diagnosis not present

## 2023-11-25 DIAGNOSIS — M5416 Radiculopathy, lumbar region: Secondary | ICD-10-CM | POA: Diagnosis not present

## 2023-11-27 ENCOUNTER — Other Ambulatory Visit: Payer: Self-pay | Admitting: Family Medicine

## 2023-11-27 DIAGNOSIS — H40013 Open angle with borderline findings, low risk, bilateral: Secondary | ICD-10-CM | POA: Diagnosis not present

## 2023-11-27 DIAGNOSIS — H04123 Dry eye syndrome of bilateral lacrimal glands: Secondary | ICD-10-CM | POA: Diagnosis not present

## 2023-11-27 DIAGNOSIS — E785 Hyperlipidemia, unspecified: Secondary | ICD-10-CM

## 2023-11-27 DIAGNOSIS — H0102A Squamous blepharitis right eye, upper and lower eyelids: Secondary | ICD-10-CM | POA: Diagnosis not present

## 2023-11-27 DIAGNOSIS — H0102B Squamous blepharitis left eye, upper and lower eyelids: Secondary | ICD-10-CM | POA: Diagnosis not present

## 2023-11-27 LAB — HM DIABETES EYE EXAM

## 2023-11-27 MED ORDER — PRAVASTATIN SODIUM 40 MG PO TABS: 40.0000 mg | ORAL_TABLET | Freq: Every day | ORAL | 1 refills | Status: DC

## 2023-11-27 NOTE — Telephone Encounter (Signed)
 Copied from CRM [id number]. Topic: Clinical - Medication Refill >> Nov 27, 2023  2:00 PM Deaijah H wrote: Medication: pravastatin  (PRAVACHOL ) 40 MG tablet  Has the patient contacted their pharmacy? Yes (Agent: If no, request that the patient contact the pharmacy for the refill. If patient does not wish to contact the pharmacy document the reason why and proceed with request.) (Agent: If yes, when and what did the pharmacy advise?) Stated it was refused/denied  This is the patient's preferred pharmacy:  [id number] [place], [place] - [address] AT [facility] [address] [place] [address] Phone: [phone] Fax: [phone]  Is this the correct pharmacy for this prescription? Yes If no, delete pharmacy and type the correct one.   Has the prescription been filled recently? Yes  Is the patient out of the medication? No  Has the patient been seen for an appointment in the last year OR does the patient have an upcoming appointment? Yes  Can we respond through MyChart? Yes  Agent: Please be advised that Rx refills may take up to 3 business days. We ask that you follow-up with your pharmacy.

## 2023-12-01 DIAGNOSIS — M5416 Radiculopathy, lumbar region: Secondary | ICD-10-CM | POA: Diagnosis not present

## 2023-12-03 DIAGNOSIS — M5416 Radiculopathy, lumbar region: Secondary | ICD-10-CM | POA: Diagnosis not present

## 2023-12-08 DIAGNOSIS — M5416 Radiculopathy, lumbar region: Secondary | ICD-10-CM | POA: Diagnosis not present

## 2023-12-10 DIAGNOSIS — M5416 Radiculopathy, lumbar region: Secondary | ICD-10-CM | POA: Diagnosis not present

## 2023-12-16 ENCOUNTER — Ambulatory Visit: Admitting: Family Medicine

## 2023-12-22 DIAGNOSIS — M5416 Radiculopathy, lumbar region: Secondary | ICD-10-CM | POA: Diagnosis not present

## 2023-12-24 ENCOUNTER — Ambulatory Visit: Payer: Self-pay | Admitting: Family Medicine

## 2023-12-24 ENCOUNTER — Encounter: Payer: Self-pay | Admitting: Family Medicine

## 2023-12-24 ENCOUNTER — Ambulatory Visit (INDEPENDENT_AMBULATORY_CARE_PROVIDER_SITE_OTHER): Admitting: Family Medicine

## 2023-12-24 VITALS — BP 118/64 | HR 64 | Temp 98.0°F | Ht 68.0 in | Wt 267.2 lb

## 2023-12-24 DIAGNOSIS — L680 Hirsutism: Secondary | ICD-10-CM

## 2023-12-24 DIAGNOSIS — H00011 Hordeolum externum right upper eyelid: Secondary | ICD-10-CM

## 2023-12-24 DIAGNOSIS — I10 Essential (primary) hypertension: Secondary | ICD-10-CM | POA: Diagnosis not present

## 2023-12-24 DIAGNOSIS — M5416 Radiculopathy, lumbar region: Secondary | ICD-10-CM | POA: Diagnosis not present

## 2023-12-24 DIAGNOSIS — E785 Hyperlipidemia, unspecified: Secondary | ICD-10-CM

## 2023-12-24 LAB — CBC WITH DIFFERENTIAL/PLATELET
Basophils Absolute: 0 K/uL (ref 0.0–0.1)
Basophils Relative: 0.7 % (ref 0.0–3.0)
Eosinophils Absolute: 0.1 K/uL (ref 0.0–0.7)
Eosinophils Relative: 1.9 % (ref 0.0–5.0)
HCT: 39.4 % (ref 36.0–46.0)
Hemoglobin: 13.3 g/dL (ref 12.0–15.0)
Lymphocytes Relative: 28 % (ref 12.0–46.0)
Lymphs Abs: 0.9 K/uL (ref 0.7–4.0)
MCHC: 33.8 g/dL (ref 30.0–36.0)
MCV: 97.4 fl (ref 78.0–100.0)
Monocytes Absolute: 0.3 K/uL (ref 0.1–1.0)
Monocytes Relative: 8.2 % (ref 3.0–12.0)
Neutro Abs: 2 K/uL (ref 1.4–7.7)
Neutrophils Relative %: 61.2 % (ref 43.0–77.0)
Platelets: 218 K/uL (ref 150.0–400.0)
RBC: 4.04 Mil/uL (ref 3.87–5.11)
RDW: 14.5 % (ref 11.5–15.5)
WBC: 3.3 K/uL — ABNORMAL LOW (ref 4.0–10.5)

## 2023-12-24 LAB — HEPATIC FUNCTION PANEL
ALT: 17 U/L (ref 0–35)
AST: 23 U/L (ref 0–37)
Albumin: 4.1 g/dL (ref 3.5–5.2)
Alkaline Phosphatase: 58 U/L (ref 39–117)
Bilirubin, Direct: 0.2 mg/dL (ref 0.0–0.3)
Total Bilirubin: 1 mg/dL (ref 0.2–1.2)
Total Protein: 6.9 g/dL (ref 6.0–8.3)

## 2023-12-24 LAB — LIPID PANEL
Cholesterol: 133 mg/dL (ref 0–200)
HDL: 55 mg/dL (ref >39.00)
LDL Cholesterol: 59 mg/dL (ref 0–99)
NonHDL: 77.6
Total CHOL/HDL Ratio: 2
Triglycerides: 94 mg/dL (ref 0.0–149.0)
VLDL: 18.8 mg/dL (ref 0.0–40.0)

## 2023-12-24 LAB — BASIC METABOLIC PANEL WITH GFR
BUN: 20 mg/dL (ref 6–23)
CO2: 28 meq/L (ref 19–32)
Calcium: 10.4 mg/dL (ref 8.4–10.5)
Chloride: 104 meq/L (ref 96–112)
Creatinine, Ser: 0.83 mg/dL (ref 0.40–1.20)
GFR: 67.05 mL/min (ref >60.00)
Glucose, Bld: 112 mg/dL — ABNORMAL HIGH (ref 70–99)
Potassium: 3.8 meq/L (ref 3.5–5.1)
Sodium: 139 meq/L (ref 135–145)

## 2023-12-24 LAB — TSH: TSH: 2.04 u[IU]/mL (ref 0.35–5.50)

## 2023-12-24 MED ORDER — LOSARTAN POTASSIUM 50 MG PO TABS: 50.0000 mg | ORAL_TABLET | Freq: Every day | ORAL | 1 refills | Status: DC

## 2023-12-24 NOTE — Progress Notes (Signed)
   Subjective:    Patient ID: Shelby Reeves, female    DOB: Jun 02, 1945, 78 y.o.   MRN: [id number]  HPI Hirsutism- at last visit we increased her Spironolactone  to 50mg  daily.    HTN- chronic problem, on Losartan  50mg , Spironolactone  50mg  daily, and hydrochlorothiazide  12.5mg  daily w/ good control.  No CP, SOB, HA's, visual changes, edema  Hyperlipidemia- chronic problem, on Pravastatin  40mg  daily.  Has gained 11 lbs since July.  Currently in PT for leg pain so not able to exercise regularly.  No abd pain, N/V.   Review of Systems For ROS see HPI     Objective:   Physical Exam Vitals reviewed.  Constitutional:      General: She is not in acute distress.    Appearance: Normal appearance. She is well-developed. She is not ill-appearing.  HENT:     Head: Normocephalic and atraumatic.  Eyes:     Conjunctiva/sclera: Conjunctivae normal.     Pupils: Pupils are equal, round, and reactive to light.  Neck:     Thyroid : No thyromegaly.  Cardiovascular:     Rate and Rhythm: Normal rate and regular rhythm.     Pulses: Normal pulses.     Heart sounds: Normal heart sounds. No murmur heard. Pulmonary:     Effort: Pulmonary effort is normal. No respiratory distress.     Breath sounds: Normal breath sounds.  Abdominal:     General: There is no distension.     Palpations: Abdomen is soft.     Tenderness: There is no abdominal tenderness.  Musculoskeletal:     Cervical back: Normal range of motion and neck supple.     Right lower leg: No edema.     Left lower leg: No edema.  Lymphadenopathy:     Cervical: No cervical adenopathy.  Skin:    General: Skin is warm and dry.  Neurological:     General: No focal deficit present.     Mental Status: She is alert and oriented to person, place, and time.  Psychiatric:        Mood and Affect: Mood normal.        Behavior: Behavior normal.        Thought Content: Thought content normal.           Assessment & Plan:

## 2023-12-24 NOTE — Assessment & Plan Note (Signed)
 Chronic problem.  Well controlled on Losartan  Spironolactone , and hydrochlorothiazide .  Check labs due to ARB and diuretic use.  No anticipated med changes.

## 2023-12-24 NOTE — Assessment & Plan Note (Signed)
 Ongoing issue.  Tolerating the increased dose of Spironolactone  well.  Unfortunately has not seemed to notice much change in facial hair.  Check labs due to K+ sparing diuretic but no anticipated med changes at this time

## 2023-12-24 NOTE — Patient Instructions (Signed)
 Schedule your complete physical in 6 months We'll notify you of your lab results and make any changes if needed Continue to work on healthy diet and exercise as you are able Call with any questions or concerns Stay Safe!  Stay Healthy! Happy Labor Day!

## 2023-12-24 NOTE — Progress Notes (Signed)
 Pt has been notified.

## 2023-12-24 NOTE — Assessment & Plan Note (Signed)
 Chronic problem, on Pravastatin  40mg  daily w/o difficulty.  Unfortunately she has gained 11 lbs since July but she is now ambulating w/ a cane and going to PE for leg pain.  Encouraged her to get physical activity as she is able.  Check labs.  Adjust meds prn

## 2024-01-04 DIAGNOSIS — M5416 Radiculopathy, lumbar region: Secondary | ICD-10-CM | POA: Diagnosis not present

## 2024-01-08 ENCOUNTER — Other Ambulatory Visit: Payer: Self-pay | Admitting: Family Medicine

## 2024-01-11 DIAGNOSIS — M5441 Lumbago with sciatica, right side: Secondary | ICD-10-CM | POA: Diagnosis not present

## 2024-01-11 DIAGNOSIS — M1711 Unilateral primary osteoarthritis, right knee: Secondary | ICD-10-CM | POA: Diagnosis not present

## 2024-01-11 DIAGNOSIS — M5416 Radiculopathy, lumbar region: Secondary | ICD-10-CM | POA: Diagnosis not present

## 2024-01-13 DIAGNOSIS — M5416 Radiculopathy, lumbar region: Secondary | ICD-10-CM | POA: Diagnosis not present

## 2024-01-14 DIAGNOSIS — H04123 Dry eye syndrome of bilateral lacrimal glands: Secondary | ICD-10-CM | POA: Diagnosis not present

## 2024-01-18 DIAGNOSIS — M5416 Radiculopathy, lumbar region: Secondary | ICD-10-CM | POA: Diagnosis not present

## 2024-01-20 DIAGNOSIS — M5416 Radiculopathy, lumbar region: Secondary | ICD-10-CM | POA: Diagnosis not present

## 2024-01-25 DIAGNOSIS — M5416 Radiculopathy, lumbar region: Secondary | ICD-10-CM | POA: Diagnosis not present

## 2024-01-28 ENCOUNTER — Other Ambulatory Visit: Payer: Self-pay | Admitting: Orthopedic Surgery

## 2024-01-28 DIAGNOSIS — M5416 Radiculopathy, lumbar region: Secondary | ICD-10-CM

## 2024-02-05 ENCOUNTER — Telehealth: Payer: Self-pay

## 2024-02-05 NOTE — Telephone Encounter (Signed)
 Called and discussed with patient, noted we did not see her for this concern and would need an appt, pt is going to reach out to eye Dr and see if they will send this, declined appt at this time.

## 2024-02-05 NOTE — Telephone Encounter (Unsigned)
 Copied from CRM [id number]. Topic: Appointments - Appointment Scheduling >> Feb 05, 2024  9:52 AM Rosina BIRCH wrote: Patient/patient representative is calling to schedule an appointment. Refer to attachments for appointment information.    Patient called stating she has stye in her left eye that is almost better and she would like erythromycin  ophthalmic ointment refilled [phone]

## 2024-02-08 DIAGNOSIS — M545 Low back pain, unspecified: Secondary | ICD-10-CM | POA: Diagnosis not present

## 2024-02-09 ENCOUNTER — Other Ambulatory Visit: Payer: Self-pay

## 2024-02-12 ENCOUNTER — Other Ambulatory Visit

## 2024-02-18 DIAGNOSIS — M5459 Other low back pain: Secondary | ICD-10-CM | POA: Diagnosis not present

## 2024-02-18 DIAGNOSIS — M25561 Pain in right knee: Secondary | ICD-10-CM | POA: Diagnosis not present

## 2024-02-18 DIAGNOSIS — M533 Sacrococcygeal disorders, not elsewhere classified: Secondary | ICD-10-CM | POA: Diagnosis not present

## 2024-02-18 DIAGNOSIS — M25551 Pain in right hip: Secondary | ICD-10-CM | POA: Diagnosis not present

## 2024-02-18 DIAGNOSIS — M791 Myalgia, unspecified site: Secondary | ICD-10-CM | POA: Diagnosis not present

## 2024-03-28 DIAGNOSIS — M51362 Other intervertebral disc degeneration, lumbar region with discogenic back pain and lower extremity pain: Secondary | ICD-10-CM | POA: Diagnosis not present

## 2024-04-06 ENCOUNTER — Other Ambulatory Visit: Payer: Self-pay | Admitting: Family Medicine

## 2024-04-06 DIAGNOSIS — I10 Essential (primary) hypertension: Secondary | ICD-10-CM

## 2024-04-26 ENCOUNTER — Other Ambulatory Visit: Payer: Self-pay

## 2024-04-26 DIAGNOSIS — I10 Essential (primary) hypertension: Secondary | ICD-10-CM

## 2024-04-26 MED ORDER — HYDROCHLOROTHIAZIDE 12.5 MG PO TABS: ORAL_TABLET | ORAL | 1 refills | Status: AC

## 2024-05-12 ENCOUNTER — Ambulatory Visit: Admitting: *Deleted

## 2024-05-12 VITALS — Ht 68.0 in | Wt 267.0 lb

## 2024-05-12 DIAGNOSIS — Z0000 Encounter for general adult medical examination without abnormal findings: Secondary | ICD-10-CM | POA: Diagnosis not present

## 2024-05-12 NOTE — Patient Instructions (Signed)
 Shelby Reeves,  Thank you for taking the time for your Medicare Wellness Visit. I appreciate your continued commitment to your health goals. Please review the care plan we discussed, and feel free to reach out if I can assist you further.  Please note that Annual Wellness Visits do not include a physical exam. Some assessments may be limited, especially if the visit was conducted virtually. If needed, we may recommend an in-person follow-up with your provider.  Ongoing Care Seeing your primary care provider every 3 to 6 months helps us  monitor your health and provide consistent, personalized care.   Referrals If a referral was made during today's visit and you haven't received any updates within two weeks, please contact the referred provider directly to check on the status.  Recommended Screenings:  Health Maintenance  Topic Date Due   Osteoporosis screening with Bone Density Scan  09/04/2023   COVID-19 Vaccine (3 - 2025-26 season) 05/28/2024*   Breast Cancer Screening  05/12/2025*   Medicare Annual Wellness Visit  05/12/2025   DTaP/Tdap/Td vaccine (2 - Td or Tdap) 12/30/2028   Pneumococcal Vaccine for age over 40  Completed   Flu Shot  Completed   Hepatitis C Screening  Completed   Zoster (Shingles) Vaccine  Completed   Meningitis B Vaccine  Aged Out   Colon Cancer Screening  Discontinued  *Topic was postponed. The date shown is not the original due date.       05/12/2024    2:27 PM  Advanced Directives  Does Patient Have a Medical Advance Directive? Yes  Type of Advance Directive Living will    Vision: Annual vision screenings are recommended for early detection of glaucoma, cataracts, and diabetic retinopathy. These exams can also reveal signs of chronic conditions such as diabetes and high blood pressure.  Dental: Annual dental screenings help detect early signs of oral cancer, gum disease, and other conditions linked to overall health, including heart disease and  diabetes.  Please see the attached documents for additional preventive care recommendations.    Shelby Reeves , Thank you for taking time to come for your Medicare Wellness Visit. I appreciate your ongoing commitment to your health goals. Please review the following plan we discussed and let me know if I can assist you in the future.   Screening recommendations/referrals: Colonoscopy:  Mammogram:  Bone Density:  Recommended yearly ophthalmology/optometry visit for glaucoma screening and checkup Recommended yearly dental visit for hygiene and checkup  Vaccinations: Influenza vaccine:  Pneumococcal vaccine:  Tdap vaccine:  Shingles vaccine    Preventive Care 65 Years and Older, Female Preventive care refers to lifestyle choices and visits with your health care provider that can promote health and wellness. What does preventive care include? A yearly physical exam. This is also called an annual well check. Dental exams once or twice a year. Routine eye exams. Ask your health care provider how often you should have your eyes checked. Personal lifestyle choices, including: Daily care of your teeth and gums. Regular physical activity. Eating a healthy diet. Avoiding tobacco and drug use. Limiting alcohol use. Practicing safe sex. Taking low-dose aspirin every day. Taking vitamin and mineral supplements as recommended by your health care provider. What happens during an annual well check? The services and screenings done by your health care provider during your annual well check will depend on your age, overall health, lifestyle risk factors, and family history of disease. Counseling  Your health care provider may ask you questions about your: Alcohol use.  Tobacco use. Drug use. Emotional well-being. Home and relationship well-being. Sexual activity. Eating habits. History of falls. Memory and ability to understand (cognition). Work and work astronomer. Reproductive  health. Screening  You may have the following tests or measurements: Height, weight, and BMI. Blood pressure. Lipid and cholesterol levels. These may be checked every 5 years, or more frequently if you are over 9 years old. Skin check. Lung cancer screening. You may have this screening every year starting at age 3 if you have a 30-pack-year history of smoking and currently smoke or have quit within the past 15 years. Fecal occult blood test (FOBT) of the stool. You may have this test every year starting at age 9. Flexible sigmoidoscopy or colonoscopy. You may have a sigmoidoscopy every 5 years or a colonoscopy every 10 years starting at age 9. Hepatitis C blood test. Hepatitis B blood test. Sexually transmitted disease (STD) testing. Diabetes screening. This is done by checking your blood sugar (glucose) after you have not eaten for a while (fasting). You may have this done every 1-3 years. Bone density scan. This is done to screen for osteoporosis. You may have this done starting at age 76. Mammogram. This may be done every 1-2 years. Talk to your health care provider about how often you should have regular mammograms. Talk with your health care provider about your test results, treatment options, and if necessary, the need for more tests. Vaccines  Your health care provider may recommend certain vaccines, such as: Influenza vaccine. This is recommended every year. Tetanus, diphtheria, and acellular pertussis (Tdap, Td) vaccine. You may need a Td booster every 10 years. Zoster vaccine. You may need this after age 60. Pneumococcal 13-valent conjugate (PCV13) vaccine. One dose is recommended after age 79. Pneumococcal polysaccharide (PPSV23) vaccine. One dose is recommended after age 56. Talk to your health care provider about which screenings and vaccines you need and how often you need them. This information is not intended to replace advice given to you by your health care provider.  Make sure you discuss any questions you have with your health care provider. Document Released: 05/11/2015 Document Revised: 01/02/2016 Document Reviewed: 02/13/2015 Elsevier Interactive Patient Education  2017 [employer].  Fall Prevention in the Home Falls can cause injuries. They can happen to people of all ages. There are many things you can do to make your home safe and to help prevent falls. What can I do on the outside of my home? Regularly fix the edges of walkways and driveways and fix any cracks. Remove anything that might make you trip as you walk through a door, such as a raised step or threshold. Trim any bushes or trees on the path to your home. Use bright outdoor lighting. Clear any walking paths of anything that might make someone trip, such as rocks or tools. Regularly check to see if handrails are loose or broken. Make sure that both sides of any steps have handrails. Any raised decks and porches should have guardrails on the edges. Have any leaves, snow, or ice cleared regularly. Use sand or salt on walking paths during winter. Clean up any spills in your garage right away. This includes oil or grease spills. What can I do in the bathroom? Use night lights. Install grab bars by the toilet and in the tub and shower. Do not use towel bars as grab bars. Use non-skid mats or decals in the tub or shower. If you need to sit down in the shower, use  a plastic, non-slip stool. Keep the floor dry. Clean up any water that spills on the floor as soon as it happens. Remove soap buildup in the tub or shower regularly. Attach bath mats securely with double-sided non-slip rug tape. Do not have throw rugs and other things on the floor that can make you trip. What can I do in the bedroom? Use night lights. Make sure that you have a light by your bed that is easy to reach. Do not use any sheets or blankets that are too big for your bed. They should not hang down onto the floor. Have a  firm chair that has side arms. You can use this for support while you get dressed. Do not have throw rugs and other things on the floor that can make you trip. What can I do in the kitchen? Clean up any spills right away. Avoid walking on wet floors. Keep items that you use a lot in easy-to-reach places. If you need to reach something above you, use a strong step stool that has a grab bar. Keep electrical cords out of the way. Do not use floor polish or wax that makes floors slippery. If you must use wax, use non-skid floor wax. Do not have throw rugs and other things on the floor that can make you trip. What can I do with my stairs? Do not leave any items on the stairs. Make sure that there are handrails on both sides of the stairs and use them. Fix handrails that are broken or loose. Make sure that handrails are as long as the stairways. Check any carpeting to make sure that it is firmly attached to the stairs. Fix any carpet that is loose or worn. Avoid having throw rugs at the top or bottom of the stairs. If you do have throw rugs, attach them to the floor with carpet tape. Make sure that you have a light switch at the top of the stairs and the bottom of the stairs. If you do not have them, ask someone to add them for you. What else can I do to help prevent falls? Wear shoes that: Do not have high heels. Have rubber bottoms. Are comfortable and fit you well. Are closed at the toe. Do not wear sandals. If you use a stepladder: Make sure that it is fully opened. Do not climb a closed stepladder. Make sure that both sides of the stepladder are locked into place. Ask someone to hold it for you, if possible. Clearly mark and make sure that you can see: Any grab bars or handrails. First and last steps. Where the edge of each step is. Use tools that help you move around (mobility aids) if they are needed. These include: Canes. Walkers. Scooters. Crutches. Turn on the lights when you  go into a dark area. Replace any light bulbs as soon as they burn out. Set up your furniture so you have a clear path. Avoid moving your furniture around. If any of your floors are uneven, fix them. If there are any pets around you, be aware of where they are. Review your medicines with your doctor. Some medicines can make you feel dizzy. This can increase your chance of falling. Ask your doctor what other things that you can do to help prevent falls. This information is not intended to replace advice given to you by your health care provider. Make sure you discuss any questions you have with your health care provider. Document Released: 02/08/2009 Document Revised:  09/20/2015 Document Reviewed: 05/19/2014 Elsevier Interactive Patient Education  2017 [employer].

## 2024-05-12 NOTE — Progress Notes (Signed)
 "  Chief Complaint  Patient presents with   Medicare Wellness     Subjective:   Shelby Reeves is a 79 y.o. female who presents for a Medicare Annual Wellness Visit.  No voiced or noted concerns at this time Patient advised to keep follow-up appointment with PCP (06-27-2024)   Visit info / Clinical Intake: Medicare Wellness Visit Type:: Subsequent Annual Wellness Visit Persons participating in visit and providing information:: patient Medicare Wellness Visit Mode:: Telephone If telephone:: video declined Since this visit was completed virtually, some vitals may be partially provided or unavailable. Missing vitals are due to the limitations of the virtual format.: Unable to obtain vitals - no equipment If Telephone or Video please confirm:: I connected with patient using audio/video enable telemedicine. I verified patient identity with two identifiers, discussed telehealth limitations, and patient agreed to proceed. Patient Location:: home Provider Location:: home Interpreter Needed?: No Pre-visit prep was completed: no AWV questionnaire completed by patient prior to visit?: no Living arrangements:: (!) lives alone Patient's Overall Health Status Rating: (!) fair Typical amount of pain: some Does pain affect daily life?: no Are you currently prescribed opioids?: no  Dietary Habits and Nutritional Risks How many meals a day?: 3 Eats fruit and vegetables daily?: yes Most meals are obtained by: preparing own meals In the last 2 weeks, have you had any of the following?: none Diabetic:: no  Functional Status Activities of Daily Living (to include ambulation/medication): Independent Ambulation: Independent with device- listed below Home Assistive Devices/Equipment: Cane Medication Administration: Independent Home Management (perform basic housework or laundry): Independent Manage your own finances?: yes Primary transportation is: driving Concerns about vision?: no *vision  screening is required for WTM* Concerns about hearing?: no  Fall Screening Falls in the past year?: 0 Number of falls in past year: 0 Was there an injury with Fall?: 0 Fall Risk Category Calculator: 0 Patient Fall Risk Level: Low Fall Risk  Fall Risk Patient at Risk for Falls Due to: Impaired mobility Fall risk Follow up: Falls evaluation completed; Education provided; Falls prevention discussed  Home and Transportation Safety: All rugs have non-skid backing?: (!) no All stairs or steps have railings?: (!) no Grab bars in the bathtub or shower?: yes Have non-skid surface in bathtub or shower?: (!) no Good home lighting?: yes Regular seat belt use?: yes Hospital stays in the last year:: no  Cognitive Assessment Difficulty concentrating, remembering, or making decisions? : yes Will 6CIT or Mini Cog be Completed: yes What year is it?: 0 points What month is it?: 0 points Give patient an address phrase to remember (5 components): its very sunny outside today in January About what time is it?: 0 points Count backwards from 20 to 1: 0 points Say the months of the year in reverse: 0 points Repeat the address phrase from earlier: 0 points 6 CIT Score: 0 points  Advance Directives (For Healthcare) Does Patient Have a Medical Advance Directive?: Yes Type of Advance Directive: Living will Copy of Living Will in Chart?: No - copy requested  Reviewed/Updated  Reviewed/Updated: Reviewed All (Medical, Surgical, Family, Medications, Allergies, Care Teams, Patient Goals); Surgical History; Family History; Medications; Care Teams; Patient Goals; Medical History; Allergies    Allergies (verified) Dust mite extract, Iodine, and Shellfish allergy   Current Medications (verified) Outpatient Encounter Medications as of 05/12/2024  Medication Sig   aspirin 81 MG tablet Take 81 mg by mouth daily.   Aspirin-Calcium Carbonate (BAYER WOMENS) 81-777 MG TABS Take by mouth.  Calcium  Citrate-Vitamin D  (CAL-CITRATE PLUS VITAMIN D ) 250-2.5 MG-MCG TABS Take by mouth.   erythromycin  ophthalmic ointment Place into the right eye 3 (three) times daily.   hydrochlorothiazide  (HYDRODIURIL ) 12.5 MG tablet TAKE 1 TO 2 TABLETS BY MOUTH EVERY DAY DEPENDING ON SWELLING   losartan  (COZAAR ) 50 MG tablet TAKE 1 TABLET(50 MG) BY MOUTH DAILY   meloxicam (MOBIC) 15 MG tablet Take 15 mg by mouth daily.   Multiple Vitamin (MULTIVITAMIN) tablet Take 1 tablet by mouth daily.   Multiple Vitamins-Minerals (HAIR SKIN AND NAILS FORMULA PO) Take by mouth.   omega-3 acid ethyl esters (LOVAZA) 1 g capsule Take by mouth daily.   pantoprazole  (PROTONIX ) 40 MG tablet TAKE 1 TABLET(40 MG) BY MOUTH DAILY   pravastatin  (PRAVACHOL ) 40 MG tablet Take 1 tablet (40 mg total) by mouth daily.   spironolactone  (ALDACTONE ) 25 MG tablet TAKE 1 TABLET(25 MG) BY MOUTH DAILY (Patient not taking: Reported on 05/12/2024)   spironolactone  (ALDACTONE ) 50 MG tablet Take 1 tablet (50 mg total) by mouth daily. (Patient not taking: Reported on 05/12/2024)   [DISCONTINUED] lisinopril -hydrochlorothiazide  (PRINZIDE ,ZESTORETIC ) 10-12.5 MG per tablet Take 1 tablet by mouth daily.   No facility-administered encounter medications on file as of 05/12/2024.    History: Past Medical History:  Diagnosis Date   Allergy    Carpal tunnel syndrome    Fibroid    GERD (gastroesophageal reflux disease)    History of colon polyps 2002   Hyperlipidemia    Hypertension    Vertigo    Past Surgical History:  Procedure Laterality Date   BIOPSY THYROID      2018   CARPAL TUNNEL RELEASE     bilateral   CATARACT EXTRACTION W/ INTRAOCULAR LENS  IMPLANT, BILATERAL Bilateral 2018   July and August   COLONOSCOPY     hx tubular adenomas 2003   ENDOMETRIAL BIOPSY  09-12-09   --benign/Dr. Romine   HYSTEROSCOPY  10-22-09   resection of polyps and D & C-Dr. Mandy   HYSTEROSCOPY  06-17-91   and D & C   KNEE ARTHROSCOPY  2010   right   POLYPECTOMY      UTERINE FIBROID SURGERY     Family History  Problem Relation Age of Onset   Heart disease Mother    Hypertension Mother    Diabetes Mother    Cancer Father        colon cancer?   Stroke Sister    Lung cancer Sister    Colon cancer Neg Hx    Stomach cancer Neg Hx    Esophageal cancer Neg Hx    Rectal cancer Neg Hx    Social History   Occupational History   Occupation: Retired  Tobacco Use   Smoking status: Never   Smokeless tobacco: Never  Vaping Use   Vaping status: Never Used  Substance and Sexual Activity   Alcohol use: Yes    Comment: rarely   Drug use: No   Sexual activity: Yes    Partners: Male   Tobacco Counseling Counseling given: Not Answered  SDOH Screenings   Food Insecurity: No Food Insecurity (05/12/2024)  Housing: Unknown (05/12/2024)  Transportation Needs: No Transportation Needs (05/12/2024)  Utilities: Not At Risk (05/12/2024)  Alcohol Screen: Low Risk (10/01/2022)  Depression (PHQ2-9): Low Risk (05/12/2024)  Financial Resource Strain: Low Risk (10/01/2022)  Physical Activity: Inactive (05/12/2024)  Social Connections: Moderately Isolated (05/12/2024)  Stress: No Stress Concern Present (05/12/2024)  Tobacco Use: Low Risk (05/12/2024)  Health Literacy: Adequate  Health Literacy (05/12/2024)   See flowsheets for full screening details  Depression Screen PHQ 2 & 9 Depression Scale- Over the past 2 weeks, how often have you been bothered by any of the following problems? Little interest or pleasure in doing things: 0 Feeling down, depressed, or hopeless (PHQ Adolescent also includes...irritable): 0 PHQ-2 Total Score: 0 Trouble falling or staying asleep, or sleeping too much: 0 Feeling tired or having little energy: 0 Poor appetite or overeating (PHQ Adolescent also includes...weight loss): 0 Feeling bad about yourself - or that you are a failure or have let yourself or your family down: 0 Trouble concentrating on things, such as reading the newspaper or  watching television (PHQ Adolescent also includes...like school work): 0 Moving or speaking so slowly that other people could have noticed. Or the opposite - being so fidgety or restless that you have been moving around a lot more than usual: 0 Thoughts that you would be better off dead, or of hurting yourself in some way: 0 PHQ-9 Total Score: 0 If you checked off any problems, how difficult have these problems made it for you to do your work, take care of things at home, or get along with other people?: Not difficult at all     Goals Addressed             This Visit's Progress    Weight (lb) < 200 lb (90.7 kg)   267 lb (121.1 kg)            Objective:    Today's Vitals   05/12/24 1423  Weight: 267 lb (121.1 kg)  Height: 5' 8 (1.727 m)   Body mass index is 40.6 kg/m.  Hearing/Vision screen Hearing Screening - Comments:: No trouble hearing Vision Screening - Comments:: [place] Up to date Immunizations and Health Maintenance Health Maintenance  Topic Date Due   Bone Density Scan  09/04/2023   COVID-19 Vaccine (3 - 2025-26 season) 05/28/2024 (Originally 12/28/2023)   Mammogram  05/12/2025 (Originally 09/04/2023)   Medicare Annual Wellness (AWV)  05/12/2025   DTaP/Tdap/Td (2 - Td or Tdap) 12/30/2028   Pneumococcal Vaccine: 50+ Years  Completed   Influenza Vaccine  Completed   Hepatitis C Screening  Completed   Zoster Vaccines- Shingrix  Completed   Meningococcal B Vaccine  Aged Out   Colonoscopy  Discontinued        Assessment/Plan:  This is a routine wellness examination for [place].  Patient Care Team: Mahlon Comer BRAVO, MD as PCP - General Aneita Gwendlyn DASEN, MD (Inactive) as Consulting Physician (Gastroenterology) Mable Lenis, MD (Inactive) as Consulting Physician (Otolaryngology) Lelon JONELLE Ferrari, MD as Referring Physician (Ophthalmology) Nicholaus Sherlean CROME, [facility] (Inactive) (Pharmacist)  I have personally reviewed and noted the following in the patients  chart:   Medical and social history Use of alcohol, tobacco or illicit drugs  Current medications and supplements including opioid prescriptions. Functional ability and status Nutritional status Physical activity Advanced directives List of other physicians Hospitalizations, surgeries, and ER visits in previous 12 months Vitals Screenings to include cognitive, depression, and falls Referrals and appointments  No orders of the defined types were placed in this encounter.  In addition, I have reviewed and discussed with patient certain preventive protocols, quality metrics, and best practice recommendations. A written personalized care plan for preventive services as well as general preventive health recommendations were provided to patient.   Mliss Graff, LPN   [id number]/84/[id number]   Return in 1 year (on 05/12/2025).  After Visit  Summary:   Nurse Notes: "

## 2024-05-18 ENCOUNTER — Other Ambulatory Visit: Payer: Self-pay | Admitting: Family Medicine

## 2024-05-18 DIAGNOSIS — E785 Hyperlipidemia, unspecified: Secondary | ICD-10-CM

## 2024-06-27 ENCOUNTER — Encounter: Admitting: Family Medicine
